# Patient Record
Sex: Female | Born: 1935 | Race: Black or African American | Hispanic: No | Smoking: Never smoker
Health system: Southern US, Community
[De-identification: ages and names within clinical notes are randomized; demographics above are authoritative.]

## PROBLEM LIST (undated history)

## (undated) DIAGNOSIS — G459 Transient cerebral ischemic attack, unspecified: Secondary | ICD-10-CM

## (undated) DIAGNOSIS — I951 Orthostatic hypotension: Secondary | ICD-10-CM

## (undated) DIAGNOSIS — E785 Hyperlipidemia, unspecified: Secondary | ICD-10-CM

## (undated) DIAGNOSIS — M109 Gout, unspecified: Secondary | ICD-10-CM

## (undated) DIAGNOSIS — I10 Essential (primary) hypertension: Secondary | ICD-10-CM

## (undated) DIAGNOSIS — D649 Anemia, unspecified: Secondary | ICD-10-CM

## (undated) DIAGNOSIS — Z8489 Family history of other specified conditions: Secondary | ICD-10-CM

## (undated) DIAGNOSIS — E119 Type 2 diabetes mellitus without complications: Secondary | ICD-10-CM

## (undated) DIAGNOSIS — N189 Chronic kidney disease, unspecified: Secondary | ICD-10-CM

## (undated) DIAGNOSIS — I739 Peripheral vascular disease, unspecified: Secondary | ICD-10-CM

## (undated) HISTORY — PX: OTHER SURGICAL HISTORY: SHX169

## (undated) HISTORY — DX: Anemia, unspecified: D64.9

## (undated) HISTORY — DX: Hyperlipidemia, unspecified: E78.5

## (undated) HISTORY — DX: Gout, unspecified: M10.9

## (undated) HISTORY — DX: Peripheral vascular disease, unspecified: I73.9

## (undated) HISTORY — PX: BREAST BIOPSY: SHX20

## (undated) HISTORY — PX: TUBAL LIGATION: SHX77

## (undated) HISTORY — DX: Essential (primary) hypertension: I10

## (undated) HISTORY — DX: Orthostatic hypotension: I95.1

## (undated) HISTORY — DX: Type 2 diabetes mellitus without complications: E11.9

## (undated) HISTORY — DX: Chronic kidney disease, unspecified: N18.9

## (undated) HISTORY — PX: KIDNEY TRANSPLANT: SHX239

## (undated) HISTORY — DX: Transient cerebral ischemic attack, unspecified: G45.9

---

## 1997-10-30 ENCOUNTER — Encounter: Admission: RE | Admit: 1997-10-30 | Discharge: 1997-10-30 | Payer: Self-pay | Admitting: Family Medicine

## 1997-11-05 ENCOUNTER — Encounter: Admission: RE | Admit: 1997-11-05 | Discharge: 1997-11-05 | Payer: Self-pay | Admitting: Family Medicine

## 1998-02-07 ENCOUNTER — Emergency Department (HOSPITAL_COMMUNITY): Admission: EM | Admit: 1998-02-07 | Discharge: 1998-02-07 | Payer: Self-pay | Admitting: Emergency Medicine

## 1998-03-17 ENCOUNTER — Encounter: Payer: Self-pay | Admitting: Emergency Medicine

## 1998-03-17 ENCOUNTER — Emergency Department (HOSPITAL_COMMUNITY): Admission: EM | Admit: 1998-03-17 | Discharge: 1998-03-17 | Payer: Self-pay | Admitting: Emergency Medicine

## 1998-06-02 ENCOUNTER — Encounter: Admission: RE | Admit: 1998-06-02 | Discharge: 1998-06-02 | Payer: Self-pay | Admitting: Family Medicine

## 1998-08-13 ENCOUNTER — Encounter: Admission: RE | Admit: 1998-08-13 | Discharge: 1998-08-13 | Payer: Self-pay | Admitting: Family Medicine

## 1998-11-06 ENCOUNTER — Encounter: Admission: RE | Admit: 1998-11-06 | Discharge: 1998-11-06 | Payer: Self-pay | Admitting: Family Medicine

## 1998-11-26 ENCOUNTER — Encounter: Admission: RE | Admit: 1998-11-26 | Discharge: 1998-11-26 | Payer: Self-pay | Admitting: Family Medicine

## 1998-12-16 ENCOUNTER — Encounter: Admission: RE | Admit: 1998-12-16 | Discharge: 1998-12-16 | Payer: Self-pay | Admitting: Family Medicine

## 1998-12-16 ENCOUNTER — Ambulatory Visit (HOSPITAL_COMMUNITY): Admission: RE | Admit: 1998-12-16 | Discharge: 1998-12-16 | Payer: Self-pay | Admitting: Family Medicine

## 1999-04-21 ENCOUNTER — Encounter: Admission: RE | Admit: 1999-04-21 | Discharge: 1999-04-21 | Payer: Self-pay | Admitting: Family Medicine

## 1999-07-13 ENCOUNTER — Encounter: Admission: RE | Admit: 1999-07-13 | Discharge: 1999-07-13 | Payer: Self-pay | Admitting: Family Medicine

## 1999-10-11 ENCOUNTER — Encounter: Admission: RE | Admit: 1999-10-11 | Discharge: 1999-10-11 | Payer: Self-pay | Admitting: Family Medicine

## 1999-10-14 ENCOUNTER — Encounter: Admission: RE | Admit: 1999-10-14 | Discharge: 1999-10-14 | Payer: Self-pay | Admitting: Family Medicine

## 1999-11-05 ENCOUNTER — Encounter: Admission: RE | Admit: 1999-11-05 | Discharge: 1999-11-05 | Payer: Self-pay | Admitting: Family Medicine

## 1999-11-09 ENCOUNTER — Encounter: Admission: RE | Admit: 1999-11-09 | Discharge: 1999-11-09 | Payer: Self-pay | Admitting: *Deleted

## 1999-11-09 ENCOUNTER — Encounter: Payer: Self-pay | Admitting: Family Medicine

## 1999-12-10 ENCOUNTER — Encounter: Admission: RE | Admit: 1999-12-10 | Discharge: 1999-12-10 | Payer: Self-pay | Admitting: Family Medicine

## 2000-04-05 ENCOUNTER — Encounter: Payer: Self-pay | Admitting: Family Medicine

## 2000-04-05 ENCOUNTER — Encounter: Admission: RE | Admit: 2000-04-05 | Discharge: 2000-04-05 | Payer: Self-pay | Admitting: *Deleted

## 2000-05-24 ENCOUNTER — Encounter: Admission: RE | Admit: 2000-05-24 | Discharge: 2000-05-24 | Payer: Self-pay | Admitting: Family Medicine

## 2000-10-05 ENCOUNTER — Encounter: Admission: RE | Admit: 2000-10-05 | Discharge: 2000-10-05 | Payer: Self-pay | Admitting: Family Medicine

## 2000-10-05 ENCOUNTER — Encounter: Admission: RE | Admit: 2000-10-05 | Discharge: 2000-10-05 | Payer: Self-pay | Admitting: Sports Medicine

## 2000-10-05 ENCOUNTER — Encounter: Payer: Self-pay | Admitting: Sports Medicine

## 2000-11-06 ENCOUNTER — Encounter: Admission: RE | Admit: 2000-11-06 | Discharge: 2000-11-06 | Payer: Self-pay | Admitting: Family Medicine

## 2000-11-20 ENCOUNTER — Encounter: Admission: RE | Admit: 2000-11-20 | Discharge: 2000-11-20 | Payer: Self-pay | Admitting: Family Medicine

## 2001-04-11 ENCOUNTER — Encounter: Admission: RE | Admit: 2001-04-11 | Discharge: 2001-04-11 | Payer: Self-pay | Admitting: Family Medicine

## 2001-07-13 ENCOUNTER — Encounter: Admission: RE | Admit: 2001-07-13 | Discharge: 2001-07-13 | Payer: Self-pay | Admitting: Family Medicine

## 2001-07-18 ENCOUNTER — Encounter: Admission: RE | Admit: 2001-07-18 | Discharge: 2001-07-18 | Payer: Self-pay | Admitting: Family Medicine

## 2001-08-06 ENCOUNTER — Encounter: Admission: RE | Admit: 2001-08-06 | Discharge: 2001-08-06 | Payer: Self-pay | Admitting: Family Medicine

## 2002-03-12 ENCOUNTER — Encounter: Payer: Self-pay | Admitting: Family Medicine

## 2002-03-12 ENCOUNTER — Encounter: Admission: RE | Admit: 2002-03-12 | Discharge: 2002-03-12 | Payer: Self-pay | Admitting: Family Medicine

## 2004-04-05 ENCOUNTER — Ambulatory Visit (HOSPITAL_COMMUNITY): Admission: RE | Admit: 2004-04-05 | Discharge: 2004-04-05 | Payer: Self-pay | Admitting: Family Medicine

## 2004-04-22 ENCOUNTER — Ambulatory Visit (HOSPITAL_COMMUNITY): Admission: RE | Admit: 2004-04-22 | Discharge: 2004-04-22 | Payer: Self-pay | Admitting: Family Medicine

## 2006-05-22 ENCOUNTER — Encounter: Admission: RE | Admit: 2006-05-22 | Discharge: 2006-05-22 | Payer: Self-pay | Admitting: Nephrology

## 2006-06-08 ENCOUNTER — Ambulatory Visit: Payer: Self-pay

## 2006-09-04 ENCOUNTER — Ambulatory Visit (HOSPITAL_COMMUNITY): Admission: RE | Admit: 2006-09-04 | Discharge: 2006-09-04 | Payer: Self-pay | Admitting: Family Medicine

## 2007-05-14 ENCOUNTER — Encounter (HOSPITAL_COMMUNITY): Admission: RE | Admit: 2007-05-14 | Discharge: 2007-08-12 | Payer: Self-pay | Admitting: Nephrology

## 2007-06-20 ENCOUNTER — Inpatient Hospital Stay (HOSPITAL_COMMUNITY): Admission: EM | Admit: 2007-06-20 | Discharge: 2007-06-26 | Payer: Self-pay | Admitting: Emergency Medicine

## 2007-06-20 ENCOUNTER — Ambulatory Visit: Payer: Self-pay | Admitting: Vascular Surgery

## 2007-06-20 ENCOUNTER — Encounter (INDEPENDENT_AMBULATORY_CARE_PROVIDER_SITE_OTHER): Payer: Self-pay | Admitting: Internal Medicine

## 2007-06-21 ENCOUNTER — Encounter (INDEPENDENT_AMBULATORY_CARE_PROVIDER_SITE_OTHER): Payer: Self-pay | Admitting: Internal Medicine

## 2007-08-21 ENCOUNTER — Encounter (HOSPITAL_COMMUNITY): Admission: RE | Admit: 2007-08-21 | Discharge: 2007-11-19 | Payer: Self-pay | Admitting: Nephrology

## 2007-12-03 ENCOUNTER — Encounter (HOSPITAL_COMMUNITY): Admission: RE | Admit: 2007-12-03 | Discharge: 2008-02-27 | Payer: Self-pay | Admitting: Nephrology

## 2008-01-08 ENCOUNTER — Encounter: Admission: RE | Admit: 2008-01-08 | Discharge: 2008-01-08 | Payer: Self-pay | Admitting: Nephrology

## 2008-01-15 ENCOUNTER — Inpatient Hospital Stay (HOSPITAL_COMMUNITY): Admission: EM | Admit: 2008-01-15 | Discharge: 2008-01-25 | Payer: Self-pay | Admitting: Emergency Medicine

## 2008-01-15 ENCOUNTER — Ambulatory Visit: Payer: Self-pay | Admitting: Vascular Surgery

## 2008-01-23 ENCOUNTER — Encounter (INDEPENDENT_AMBULATORY_CARE_PROVIDER_SITE_OTHER): Payer: Self-pay | Admitting: Nephrology

## 2008-01-23 ENCOUNTER — Ambulatory Visit: Payer: Self-pay | Admitting: Cardiovascular Disease

## 2008-03-05 ENCOUNTER — Ambulatory Visit: Payer: Self-pay | Admitting: Vascular Surgery

## 2008-03-05 ENCOUNTER — Ambulatory Visit (HOSPITAL_COMMUNITY): Admission: RE | Admit: 2008-03-05 | Discharge: 2008-03-05 | Payer: Self-pay | Admitting: Vascular Surgery

## 2008-03-29 ENCOUNTER — Emergency Department (HOSPITAL_COMMUNITY): Admission: EM | Admit: 2008-03-29 | Discharge: 2008-03-29 | Payer: Self-pay | Admitting: Emergency Medicine

## 2008-05-07 ENCOUNTER — Ambulatory Visit (HOSPITAL_COMMUNITY): Admission: RE | Admit: 2008-05-07 | Discharge: 2008-05-07 | Payer: Self-pay | Admitting: Nephrology

## 2008-06-20 ENCOUNTER — Encounter: Admission: RE | Admit: 2008-06-20 | Discharge: 2008-06-20 | Payer: Self-pay | Admitting: Family Medicine

## 2008-11-20 ENCOUNTER — Encounter: Admission: RE | Admit: 2008-11-20 | Discharge: 2008-11-20 | Payer: Self-pay | Admitting: Nephrology

## 2008-11-28 ENCOUNTER — Ambulatory Visit (HOSPITAL_COMMUNITY): Admission: RE | Admit: 2008-11-28 | Discharge: 2008-11-28 | Payer: Self-pay | Admitting: Nephrology

## 2009-03-11 ENCOUNTER — Ambulatory Visit: Payer: Self-pay | Admitting: Vascular Surgery

## 2009-03-15 ENCOUNTER — Ambulatory Visit: Payer: Self-pay | Admitting: Vascular Surgery

## 2009-03-16 ENCOUNTER — Ambulatory Visit (HOSPITAL_COMMUNITY): Admission: RE | Admit: 2009-03-16 | Discharge: 2009-03-16 | Payer: Self-pay | Admitting: Vascular Surgery

## 2009-06-26 ENCOUNTER — Ambulatory Visit (HOSPITAL_COMMUNITY): Admission: RE | Admit: 2009-06-26 | Discharge: 2009-06-26 | Payer: Self-pay | Admitting: Family Medicine

## 2009-06-29 ENCOUNTER — Other Ambulatory Visit: Admission: RE | Admit: 2009-06-29 | Discharge: 2009-06-29 | Payer: Self-pay | Admitting: Family Medicine

## 2009-08-26 ENCOUNTER — Ambulatory Visit (HOSPITAL_COMMUNITY): Admission: RE | Admit: 2009-08-26 | Discharge: 2009-08-26 | Payer: Self-pay | Admitting: Nephrology

## 2009-10-23 ENCOUNTER — Inpatient Hospital Stay (HOSPITAL_COMMUNITY): Admission: EM | Admit: 2009-10-23 | Discharge: 2009-10-25 | Payer: Self-pay | Admitting: Emergency Medicine

## 2009-11-11 ENCOUNTER — Ambulatory Visit (HOSPITAL_COMMUNITY): Admission: RE | Admit: 2009-11-11 | Discharge: 2009-11-11 | Payer: Self-pay | Admitting: Nephrology

## 2010-01-11 ENCOUNTER — Emergency Department (HOSPITAL_COMMUNITY): Admission: EM | Admit: 2010-01-11 | Discharge: 2010-01-11 | Payer: Self-pay | Admitting: Emergency Medicine

## 2010-03-11 ENCOUNTER — Emergency Department (HOSPITAL_COMMUNITY)
Admission: EM | Admit: 2010-03-11 | Discharge: 2010-03-11 | Payer: Self-pay | Source: Home / Self Care | Admitting: Emergency Medicine

## 2010-03-12 ENCOUNTER — Encounter: Payer: Self-pay | Admitting: Nephrology

## 2010-03-12 ENCOUNTER — Ambulatory Visit: Payer: Self-pay | Admitting: Vascular Surgery

## 2010-03-12 ENCOUNTER — Ambulatory Visit (HOSPITAL_COMMUNITY): Admission: RE | Admit: 2010-03-12 | Discharge: 2010-03-12 | Payer: Self-pay | Admitting: Surgery

## 2010-04-26 ENCOUNTER — Ambulatory Visit (HOSPITAL_COMMUNITY): Admission: RE | Admit: 2010-04-26 | Discharge: 2010-04-26 | Payer: Self-pay | Admitting: Surgery

## 2010-04-26 ENCOUNTER — Ambulatory Visit: Payer: Self-pay | Admitting: Vascular Surgery

## 2010-06-27 ENCOUNTER — Encounter: Payer: Self-pay | Admitting: Family Medicine

## 2010-07-02 ENCOUNTER — Ambulatory Visit: Admit: 2010-07-02 | Payer: Self-pay | Admitting: Vascular Surgery

## 2010-07-23 ENCOUNTER — Encounter (INDEPENDENT_AMBULATORY_CARE_PROVIDER_SITE_OTHER): Payer: Medicare Other | Admitting: Vascular Surgery

## 2010-07-23 ENCOUNTER — Encounter (INDEPENDENT_AMBULATORY_CARE_PROVIDER_SITE_OTHER): Payer: Medicare Other

## 2010-07-23 ENCOUNTER — Encounter: Payer: Self-pay | Admitting: *Deleted

## 2010-07-23 DIAGNOSIS — I70219 Atherosclerosis of native arteries of extremities with intermittent claudication, unspecified extremity: Secondary | ICD-10-CM

## 2010-07-26 NOTE — Assessment & Plan Note (Signed)
OFFICE VISIT  April Casey, April Casey DOB:  23-Aug-1931                                       07/23/2010 ZOXWR#:[id number]  This is an established patient.  Consultation is for right hip pain. Consultation is from Dr. Laurann Montana.  HISTORY OF PRESENT ILLNESS:  This is a 77 year old end-stage renal patient that has been seen at our office for multiple access needs that presents with cc: onset of right hip pain for about 1 month.  She describes this as persistent pain that radiates from her right hip down to her thigh.  She denies any worsening of this pain with ambulation and she also denies any pain in the calf or the foot with ambulation.  The pain does not necessarily improve with rest.  She also believes that her left thigh is beginning to have similar pain.  She denies any previous history of rest pain or any ulcerations or any type of gangrene.  Her atherosclerotic risk factors include hypertension, diabetes and end- stage renal disease.  PAST MEDICAL HISTORY:  Included end-stage renal disease requiring hemodialysis, hypertension, diabetes, anemia, secondary hyperparathyroidism, hyperlipidemia, history of TIA, gout.  PAST SURGICAL HISTORY:  Included a right internal jugular tunneled dialysis catheter in the left upper arm arteriovenous graft done in October of 2011.  Also, she underwent a thrombectomy and revision of left forearm arteriovenous graft in October of 2010.  She has also had a left internal jugular vein tunneled dialysis catheter placed in August of 2009.  SOCIAL HISTORY:  Included no tobacco, alcohol or illicit drug use.  FAMILY HISTORY:  Mother had a stroke and hypertension and father also had a stroke and had hypertension.  MEDICATIONS:  Included calcium, aspirin, meclizine, Plavix, glimepiride, lisinopril, amlodipine, Crestor.  ALLERGIES:  She has no known drug allergies but does have intolerance to codeine which gives her  nausea.  REVIEW OF SYSTEMS:  She had change in eyesight, sore throat, pain in leg with walking, pain in feet with lying flat, arthritis joint pain, muscle pain, anemia and kidney disease.  Otherwise the rest of her review of system was documented as negative.  PHYSICAL EXAMINATION:  Vital signs:  She had a blood pressure of 173/71, heart rate 65, respirations were 12. General:  The patient was obese, no apparent distress, alert and oriented x3. Head:  Normocephalic, atraumatic. ENT:  Oropharynx without any erythema or exudate.  Nares without any drainage or erythema.  Hearing is grossly intact. Eyes:  Pupils were equal, round, reactive to light.  Extraocular movements were intact. Neck:  Supple neck without any nuchal rigidity. Pulmonary:  She has symmetric expansion, good air movement.  No rales, rhonchi or wheezing. Cardiac:  She had regular rate and rhythm.  Normal S1-S2.  No murmurs, rubs, thrills or gallops. Vascular:  Palpable upper extremity radial and brachial pulses. Bilateral carotids were palpable.  The pulses can be auscultated in the carotids but no bruit per se.  Cannot appreciate her aorta due to her obesity.  There were no masses.  No obvious costovertebral angle tenderness.  There are bilaterally palpable femoral pulses, on the left foot I felt palpable dorsalis pedis, I did not appreciate the posterior tibial on the left side.  On the right side I felt the dorsalis pedis but I did not feel the posterior tibial. Musculoskeletal:  She had 5/5 strength in all  extremities including the left hand.  There was no evidence of any cyanosis or ulcerations. Neuro:  Cranial nerves II-XII were grossly intact.  Motor strength was noted above.  Sensation was intact in all extremities including the left arm. Psychiatric:  Judgment was intact.  Mood and affect were appropriate for her clinical situation. Skin:  Extremities as listed above.  Otherwise I did not note  any rashes. Lymphatic:  There was no cervical, axillary or inguinal lymphadenopathy. Extremities:  Left upper extremity there was a palpable pulse in this left upper arm graft.  The graft is highly pulsatile with a thrill in place.  However, there is somewhat resistive quality to this bruit in this upper arm.  Noninvasive vascular imaging: 1. She had bilateral ABIs completed.  This demonstrated ABI on the     right side of 1.21 and on the left 0.76.  The waveforms of the PT     and DP on the right side were triphasic, on the left posterior     tibial was monophasic and the dorsalis pedis was biphasic. 2. She also underwent exercise testing and this demonstrated on the     right side a drop to 1.15 with return to baseline at 1.22 after 3     minutes, on the left side she dropped from 0.76 down to 0.53 with a     return to 0.74.  MEDICAL DECISION MAKING:  This is a 77 year old female who presents with symptomatology that is not consistent with claudication.  Additionally, she has evidence of likely venous stenosis in the left upper arm graft. First I will address the peripheral arterial disease.  She has evidence in the left leg of likely iliac stenosis, however, she is completely asymptomatic at this point in this leg.  We discussed the natural history of peripheral arterial disease and this patient she has what likely is asymptomatic peripheral arterial disease.  I suspect also on the right side there is some degree of disease with calcified vessels resulting in the elevated ABIs.  At this point we agreed to proceed with purely a walking plan and maximal medical management.  I discussed in depth the nature of a walking plan and will follow up in 3 months for repeat studies to determine whether any progression of peripheral arterial disease.  My suspicion is that her right hip pain is more musculoskeletal in nature and may have a degenerative back etiology.  In regards to the venous  stenosis I recommended to the patient she undergo a shuntogram of the left graft and then possible angioplasty of any venous stenosis.  She agrees to this and will have it set up for this coming Monday.  Thank you for giving [place] the opportunity to participate in this patient's care.    Fransisco Hertz, MD Electronically Signed  BLC/MEDQ  D:  07/23/2010  T:  07/23/2010  Job:  [id number]  cc:   Stacie Acres. Cliffton Asters, M.D.

## 2010-07-27 ENCOUNTER — Other Ambulatory Visit (HOSPITAL_COMMUNITY): Payer: Self-pay | Admitting: Nephrology

## 2010-07-27 DIAGNOSIS — N186 End stage renal disease: Secondary | ICD-10-CM

## 2010-07-29 ENCOUNTER — Ambulatory Visit (HOSPITAL_COMMUNITY)
Admission: RE | Admit: 2010-07-29 | Discharge: 2010-07-29 | Disposition: A | Discharge status: Home / Self Care | Payer: Medicare Other | Source: Ambulatory Visit | Attending: Vascular Surgery | Admitting: Vascular Surgery

## 2010-07-29 DIAGNOSIS — T82898A Other specified complication of vascular prosthetic devices, implants and grafts, initial encounter: Secondary | ICD-10-CM | POA: Insufficient documentation

## 2010-07-29 DIAGNOSIS — I120 Hypertensive chronic kidney disease with stage 5 chronic kidney disease or end stage renal disease: Secondary | ICD-10-CM

## 2010-07-29 DIAGNOSIS — Z992 Dependence on renal dialysis: Secondary | ICD-10-CM | POA: Insufficient documentation

## 2010-07-29 DIAGNOSIS — Y849 Medical procedure, unspecified as the cause of abnormal reaction of the patient, or of later complication, without mention of misadventure at the time of the procedure: Secondary | ICD-10-CM | POA: Insufficient documentation

## 2010-07-29 DIAGNOSIS — E119 Type 2 diabetes mellitus without complications: Secondary | ICD-10-CM | POA: Insufficient documentation

## 2010-07-29 DIAGNOSIS — N186 End stage renal disease: Secondary | ICD-10-CM

## 2010-07-29 LAB — POCT I-STAT, CHEM 8
BUN: 55 mg/dL — ABNORMAL HIGH (ref 6–23)
Chloride: 109 mEq/L (ref 96–112)
HCT: 41 % (ref 36.0–46.0)
Potassium: 5.5 mEq/L — ABNORMAL HIGH (ref 3.5–5.1)
Sodium: 139 mEq/L (ref 135–145)

## 2010-07-29 LAB — GLUCOSE, CAPILLARY: Glucose-Capillary: 134 mg/dL — ABNORMAL HIGH (ref 70–99)

## 2010-07-31 NOTE — Op Note (Signed)
April Casey, DELAGARZA NO.:  [id number]  MEDICAL RECORD NO.:  [id number]           PATIENT TYPE:  O  LOCATION:  [facility]                         FACILITY:  [facility]  PHYSICIAN:  Fransisco Hertz, MD       DATE OF BIRTH:  February 09, 1932  DATE OF PROCEDURE:  07/29/2010 DATE OF DISCHARGE:  07/29/2010                              OPERATIVE REPORT   PROCEDURES: 1. Left upper arm arteriovenous graft cannulation under ultrasound     guidance. 2. Left shuntogram. 3. Venoplasty of the left high brachial vein x3.  PREOPERATIVE DIAGNOSES:  Malfunctioning left upper arm arteriovenous graft and possible venous outflow stenosis.  POSTOPERATIVE DIAGNOSES:  Malfunctioning left upper arm arteriovenous graft and possible venous outflow stenosis.  SURGEON:  Fransisco Hertz, MD.  ESTIMATED BLOOD LOSS:  Minimal.  CONTRAST:  About 85 mL.  ANESTHESIA:  Conscious sedation was utilized as anesthesia.  FINDINGS IN THIS CASE: 1. About a 2-cm length stenosis in the left high brachial artery     distal to the anastomosis. 2. Resolution of most of the stenosis with a small area of residual     stenosis of about 30%. 3. There was a limited rupture in the area of angioplasty which resolved     with repeat low pressure inflation.  INDICATIONS:  This 77 year old female has known left upper arm venous outflow stenosis.  She previously has had percutaneous interventions on this arm.  Most recently, when I saw her in clinic for evaluation for claudication, she had a highly pulsatile left upper arm arteriovenous graft.  I felt that she needed intervention before this graft would thrombose.  I discussed with her performing a left arm shuntogram and possible venous angioplasty to improve the venous outflow.  She is aware of the risks of this procedure, which include bleeding, infection, possible anaphylactic reaction to the dye, possible rupture of the vein, and possible need for emergent  surgical intervention.  She was aware of these risks and agreed to proceed forward with such.  DESCRIPTION OF THE OPERATION:  After full informed written consent was obtained from the patient, she was brought back to the angio suite and placed supine upon the angio table.  She was giving conscious sedation, amounts of which are documented in chart.  After being connected to monitoring equipment, she was prepped and draped in standard fashion for a left arm shuntogram.  I turned my attention to her arterial upper arm arteriovenous graft.  Under ultrasound guidance, I identified a patent segment of the graft near the arterial anastomosis.  This segment was cannulated with micropuncture needle, and a microwire was passed up into the graft.  The needle was exchanged for a microsheath, and then the sheath was connected to an extension tubing.  Hand injection was completed, which demonstrated an approximate 5-cm area of stenosis in the high brachial vein, which appeared to be distal to the anastomosis. Based on these findings, this would need immediate intervention to keep this graft patent.  A Bentson wire was then passed through the sheath and advanced up to the level of the innominate  vein.  The microsheath was then exchanged for a 6-French short sheath which was then hubbed and then flushed with heparinized saline.  At this point then, I obtained a 6-mm x 60-mm Powerflex balloon and inflated over the area of the stenosis.  This demonstrated greater than 30% residual stenosis, so this was exchanged for an 8-mm x 60-mm Powerflex balloon.  The previous inflation was up to 8 mm for 1 minute.  The 8-mm x 60-mm balloon was inflated to 8 atmospheres at 1-minute inflation. This continued to demonstrate an area of stenosis, so I felt that re-treatment with a [place]ado high-pressure balloon was going to be necessary.  We obtained an 8-mm x 40 mm Dorado balloon.  This was inflated over the areas  of recurrent stenosis, and we had resolution of the waist at 15 atmospheres at 1 minute.  This demonstrated a great improvement in the areas of stenosis.  There remained, however, more proximally, one short area of stenosis, so that we reinflated the [facility] balloon proximally and held it there at 15 atmospheres for 1 minute.  The balloon was removed.  Hand injection demonstrated a small area of extravasation, so I replaced the balloon over the area of extravasation and inflated the balloon to 4 atmospheres and held inflation for 90 seconds, and then removed the balloon.  Hand injections demonstrated no further extravasation at this point.  At this point, there was a strong thrill throughout this graft.  While there was one area on completion injection with about 30% stenosis towards the distal end of this stenotic segment, I felt that as there was already evidence of a previous partial rupture, that any further intervention would no longer be safe, so at this point we would accept this and allow the patient to heal.  In my opinion, in this patient, any further percutaneous interventions are likely to be fraught with possible vein rupture, so she should be considered for a jump graft revision if our graft becomes stenotic once again.  COMPLICATIONS:  None.  CONDITION:  Stable.     Fransisco Hertz, MD     BLC/MEDQ  D:  07/29/2010  T:  07/29/2010  Job:  [id number]  Electronically Signed by Leonides Sake MD on 07/31/2010 04:18:01 PM

## 2010-08-06 ENCOUNTER — Ambulatory Visit (HOSPITAL_COMMUNITY): Payer: Medicare Other

## 2010-08-19 LAB — POTASSIUM: Potassium: 5.5 mEq/L — ABNORMAL HIGH (ref 3.5–5.1)

## 2010-08-19 LAB — POCT I-STAT 4, (NA,K, GLUC, HGB,HCT)
Glucose, Bld: 104 mg/dL — ABNORMAL HIGH (ref 70–99)
Glucose, Bld: 54 mg/dL — ABNORMAL LOW (ref 70–99)
HCT: 30 % — ABNORMAL LOW (ref 36.0–46.0)
HCT: 32 % — ABNORMAL LOW (ref 36.0–46.0)
HCT: 38 % (ref 36.0–46.0)
Hemoglobin: 10.2 g/dL — ABNORMAL LOW (ref 12.0–15.0)
Hemoglobin: 10.9 g/dL — ABNORMAL LOW (ref 12.0–15.0)
Potassium: 4.1 meq/L (ref 3.5–5.1)
Potassium: 7.8 mEq/L (ref 3.5–5.1)
Sodium: 138 mEq/L (ref 135–145)
Sodium: 140 mEq/L (ref 135–145)

## 2010-08-19 LAB — GLUCOSE, CAPILLARY
Glucose-Capillary: 109 mg/dL — ABNORMAL HIGH (ref 70–99)
Glucose-Capillary: 148 mg/dL — ABNORMAL HIGH (ref 70–99)
Glucose-Capillary: 46 mg/dL — ABNORMAL LOW (ref 70–99)

## 2010-08-23 LAB — TYPE AND SCREEN
ABO/RH(D): O POS
Antibody Screen: NEGATIVE

## 2010-08-23 LAB — DIFFERENTIAL
Basophils Absolute: 0 10*3/uL (ref 0.0–0.1)
Basophils Relative: 0 % (ref 0–1)
Eosinophils Absolute: 0.1 10*3/uL (ref 0.0–0.7)
Eosinophils Relative: 0 % (ref 0–5)
Lymphocytes Relative: 22 % (ref 12–46)
Lymphocytes Relative: 36 % (ref 12–46)
Lymphs Abs: 1.8 10*3/uL (ref 0.7–4.0)
Lymphs Abs: 3 10*3/uL (ref 0.7–4.0)
Monocytes Relative: 6 % (ref 3–12)
Neutro Abs: 6 10*3/uL (ref 1.7–7.7)
Neutrophils Relative %: 51 % (ref 43–77)

## 2010-08-23 LAB — CBC
HCT: 18.5 % — ABNORMAL LOW (ref 36.0–46.0)
HCT: 25.9 % — ABNORMAL LOW (ref 36.0–46.0)
Hemoglobin: 6.2 g/dL — CL (ref 12.0–15.0)
Hemoglobin: 8.5 g/dL — ABNORMAL LOW (ref 12.0–15.0)
Hemoglobin: 9.7 g/dL — ABNORMAL LOW (ref 12.0–15.0)
MCHC: 33 g/dL (ref 30.0–36.0)
MCHC: 33.1 g/dL (ref 30.0–36.0)
MCHC: 33.2 g/dL (ref 30.0–36.0)
MCV: 91.4 fL (ref 78.0–100.0)
MCV: 92.6 fL (ref 78.0–100.0)
Platelets: 220 K/uL (ref 150–400)
RBC: 2.8 MIL/uL — ABNORMAL LOW (ref 3.87–5.11)
RBC: 3.19 MIL/uL — ABNORMAL LOW (ref 3.87–5.11)
RDW: 15.7 % — ABNORMAL HIGH (ref 11.5–15.5)
RDW: 16.1 % — ABNORMAL HIGH (ref 11.5–15.5)
WBC: 8.3 K/uL (ref 4.0–10.5)

## 2010-08-23 LAB — RENAL FUNCTION PANEL
CO2: 27 mEq/L (ref 19–32)
Calcium: 8.8 mg/dL (ref 8.4–10.5)
Creatinine, Ser: 8.59 mg/dL — ABNORMAL HIGH (ref 0.4–1.2)
GFR calc Af Amer: 5 mL/min — ABNORMAL LOW (ref >60)
GFR calc non Af Amer: 5 mL/min — ABNORMAL LOW (ref >60)
Phosphorus: 5.6 mg/dL — ABNORMAL HIGH (ref 2.3–4.6)

## 2010-08-23 LAB — BASIC METABOLIC PANEL
BUN: 37 mg/dL — ABNORMAL HIGH (ref 6–23)
CO2: 25 mEq/L (ref 19–32)
Chloride: 101 mEq/L (ref 96–112)
Creatinine, Ser: 7.21 mg/dL — ABNORMAL HIGH (ref 0.4–1.2)
GFR calc non Af Amer: 6 mL/min — ABNORMAL LOW (ref >60)

## 2010-08-23 LAB — POCT I-STAT, CHEM 8
BUN: 39 mg/dL — ABNORMAL HIGH (ref 6–23)
Calcium, Ion: 1 mmol/L — ABNORMAL LOW (ref 1.12–1.32)
Chloride: 105 meq/L (ref 96–112)
Creatinine, Ser: 7.2 mg/dL — ABNORMAL HIGH (ref 0.4–1.2)
Glucose, Bld: 227 mg/dL — ABNORMAL HIGH (ref 70–99)
HCT: 27 % — ABNORMAL LOW (ref 36.0–46.0)
Hemoglobin: 9.2 g/dL — ABNORMAL LOW (ref 12.0–15.0)
Potassium: 4.4 meq/L (ref 3.5–5.1)
Sodium: 139 meq/L (ref 135–145)
TCO2: 24 mmol/L (ref 0–100)

## 2010-08-23 LAB — HEMOCCULT GUIAC POC 1CARD (OFFICE): Fecal Occult Bld: POSITIVE

## 2010-08-23 LAB — COMPREHENSIVE METABOLIC PANEL
ALT: 9 U/L (ref 0–35)
CO2: 28 mEq/L (ref 19–32)
Calcium: 8.9 mg/dL (ref 8.4–10.5)
Creatinine, Ser: 5.9 mg/dL — ABNORMAL HIGH (ref 0.4–1.2)
GFR calc non Af Amer: 7 mL/min — ABNORMAL LOW (ref >60)
Glucose, Bld: 210 mg/dL — ABNORMAL HIGH (ref 70–99)

## 2010-08-23 LAB — GLUCOSE, CAPILLARY
Glucose-Capillary: 159 mg/dL — ABNORMAL HIGH (ref 70–99)
Glucose-Capillary: 68 mg/dL — ABNORMAL LOW (ref 70–99)
Glucose-Capillary: 96 mg/dL (ref 70–99)
Glucose-Capillary: 98 mg/dL (ref 70–99)
Glucose-Capillary: 99 mg/dL (ref 70–99)

## 2010-08-23 LAB — APTT: aPTT: 29 s (ref 24–37)

## 2010-08-23 LAB — LIPID PANEL
HDL: 23 mg/dL — ABNORMAL LOW (ref >39)
Total CHOL/HDL Ratio: 3.2 RATIO

## 2010-08-23 LAB — HEPATITIS B SURFACE ANTIGEN: Hepatitis B Surface Ag: NEGATIVE

## 2010-08-23 LAB — ABO/RH: ABO/RH(D): O POS

## 2010-08-23 LAB — PROTIME-INR
INR: 1.05 (ref 0.00–1.49)
Prothrombin Time: 13.6 s (ref 11.6–15.2)

## 2010-08-23 LAB — MAGNESIUM: Magnesium: 2.2 mg/dL (ref 1.5–2.5)

## 2010-08-23 LAB — HEMOGLOBIN A1C: Mean Plasma Glucose: 197 mg/dL — ABNORMAL HIGH (ref <117)

## 2010-09-09 LAB — POCT I-STAT 4, (NA,K, GLUC, HGB,HCT)
Glucose, Bld: 175 mg/dL — ABNORMAL HIGH (ref 70–99)
Hemoglobin: 13.9 g/dL (ref 12.0–15.0)

## 2010-10-19 NOTE — Discharge Summary (Signed)
NAMENAIKA, NOTO NO.:  [id number]   MEDICAL RECORD NO.:  [id number]          PATIENT TYPE:  INP   LOCATION:  6704                         FACILITY:  [facility]   PHYSICIAN:  Wilber Bihari. Caryn Section, M.D.   DATE OF BIRTH:  Mar 29, 1932   DATE OF ADMISSION:  01/14/2008  DATE OF DISCHARGE:  01/25/2008                               DISCHARGE SUMMARY   Please note that we had planned on discharging the patient on January 17, 2008, but she was weak and we kept her in the hospital until January 25, 2008, trying to address her weakness.  She is now being discharged on  January 25, 2008.  This discharge summary supersedes the discharge  summary that was done on January 17, 2008.   PRIMARY CARE [place]:  Dr. Camille Bal with [facility].   CONSULTANTS:  [facility].   PROCEDURES:  Placement of a left PermCath, placement of a left AV graft,  and hemodialysis.   REASON FOR ADMISSION:  The patient is a 77 year old black woman with  known chronic kidney disease who was scheduled to get a PermCath on  January 16, 2008, and to start hemodialysis on January 17, 2008; however,  she presented to the emergency room on January 14, 2008, after having 5  days of decreased appetite and increased fatigue and it was decided  that, given these uremic symptoms, we should go ahead and start  hemodialysis earlier and admit her to the hospital.   DISCHARGE DIAGNOSES:  1. End-stage kidney disease, now initiated on hemodialysis.  2. Type 2 diabetes.  3. History of hypertension, but actually having some hypotension      during this admission.  4. Anemia.  5. Secondary hyperparathyroidism.  6. Dyslipidemia.  7. History of right middle cerebral artery stenosis.  8. Small vessel disease, on Plavix.  9. Generalized weakness and deconditioning.   PERTINENT LABORATORIES:  On admission, the patient's hemoglobin was 9.9  whereas it had previously been 11.2 at the end of June.  Her BUN  was  142, her creatinine was 13.8, potassium 4.0, albumin 2.7, AST 244, and  ALT 178.  An EKG was done that showed no peak T waves, some sinus  bradycardia, and a slightly prolonged QT with a QT of 490 and a QTc of  464.  Additionally, on labs, her bicarbonate was 15 with an anion gap of  19.   Discharge medicines are as follows:  1. Aspirin 81 mg p.o. nightly.  2. PhosLo 2 tablets p.o. t.i.d. with meals.  3. Plavix 75 mg p.o. nightly.  4. Nepro 1 can p.o. b.i.d.  5. Nephro-Vite 1 tablet p.o. daily.  6. Crestor 40 mg p.o. nightly.   The patient was told to stop taking the following medicines at home:  Verapamil, glimepiride, Coreg, Januvia, and furosemide.   Hemodialysis orders are as follows:  A 2K bath, 2.5 calcium bath,  standard heparin, and duration 4 hours.  Estimated dry weight of 76.5.  Access currently through her Diatek.  She does have a left AV graft that  is  maturing.  BFR 400 and DFR 800.  Standard heparin.  EPO 28,000 units  every dialysis.  Hectorol 2 mcg every dialysis.  Iron is Venofer 100 mg  on Tuesday, Thursday, and Saturday through to February 05, 2008, then  she will changed to 50 mg on Wednesday.  Other dialysis orders includes  please check CBGs with each dialysis.   STUDIES DURING THIS HOSPITALIZATION:  We were concerned because the  patient was weak even after 1 weeks' worth of hemodialysis when we  thought her uremic symptoms should have been cleared up, so we did an  echocardiogram to make sure there was no cardiac cause for her weakness.  Results are as follows:  Overall normal left ventricular function with  an EF estimated to be 55%, mildly increased left ventricular wall  thickness, mildly calcified aortic valve, and a left atrium that was  upper limits of normal in terms of size.  No evidence of any cardiac  embolism.  Additionally, we did a CT head without contrast to make sure  there was no intracranial cause for her continued weakness.  It was   negative for bleed or other acute intracranial process.  It did show  some atrophy and nonspecific white matter changes, but that was  consistent with past CTs of her head.  Also, during the patient's stay,  she was having some abdominal discomfort.  An acute abdominal series was  done, which showed a nonobstructive bowel or gas pattern and no acute  cardiopulmonary abnormalities.   HOSPITAL COURSE:  1. End-stage renal disease.  Upon admission, the patient had signs of      uremia including decreased appetite and weakness and a      significantly increased BUN and creatinine when compared with her      most recent labs that were done on January 02, 2008.  A left PermCath      was placed.  An left arm AV graft was also placed and she was      initiated on dialysis through the left PermCath on the day after      admission.  She had been receiving Tuesday and Thursday dialysis      while here in the hospital with no complications.  2. Anemia.  The patient's hemoglobin was 9.9 on admission.  We placed      her on Aranesp and she will go home on the EPO dose listed in the      medication discharge summary.  Her iron was measured at 52 and her      percent saturation was measured at 20.  We will also give her total      of 10 doses of Venofer as outlined in the discharge medications.      After those 10 doses are completed on February 05, 2008, we will      change her to 50 mg every week.  3. History of a TIA.  She is on Plavix and aspirin for this.  In the      outpatient notes from Dr. Eliott Nine, it is noted that the patient has      a goal systolic blood pressure between 140 and 160.  We had a very      difficult time maintaining those blood pressures when she started      on hemodialysis.  Therefore, we have discontinued her verapamil and      her Coreg in an effort to support her blood pressures both off  dialysis and during dialysis.  We have not been able to maintain      that target  blood pressure during this hospital stay.  At the time      of discharge, her nonhemodialysis pressures have generally been in      the low 100 to the 120s systolic.  During dialysis on her most      recent treatment, her lowest systolic blood pressure was 109, so      this is an issue that will need continued monitoring.  4. Secondary hyperparathyroidism.  The patient did have a high      phosphorus that was 12.8 at the time of the admission.  At the time      of discharge, it decreased to 1.7.  We expect that this will go      back up following her hospitalization.  Because her phosphorus had      been so high, we placed her on both Fosrenol and PhosLo, so for      discharge, we are only sending her home on the PhosLo. Her      phosphorus may go back into the normal range on this new regimen.      We also placed her on Hectorol outpatient.  5. Diabetes.  While in the hospital, the patient early during her      hospitalization had a number of low blood sugars.  When she was      admitted, she was on both Januvia 100 mg and glimepiride 1 mg.      During her hospitalization, we stopped these medicines and placed      her on sliding scale insulin and a renal diet.  Her blood sugars      were mostly within a good range around 120, but she did have      several sugars that were in the 170s-220s and she did receive some      sliding scale insulin, although a small amount.  At discharge, we      are discharging her on no oral hypoglycemics and no insulin because      I think that the risk for her blood pressure going too low      outweighs the benefits of discharging her on these medications.  We      will need to see how her blood sugars do after her discharge and we      have ordered CBG checks every dialysis.  She may need to go back on      some type of diabetic medication in the future, but we need to see      how her sugars progress as an outpatient.  6. Blood pressures.  As mentioned  previously, the patient has a goal      systolic blood pressure in the 140s-160s due to having a right MCA      stenosis that is presignificant.  As I mentioned earlier, we have      been unable to maintain pressures in that range despite      discontinuing both her verapamil and Coreg, so again, this will be      an issue for followup on an outpatient basis.  7. Continued weakness.  We were hoping to discharge the patient      approximately 1 week ago, but she was weak, unable to walk, unable      to get out of bed.  PT consult was ordered, and the patient  was      initially having a difficult time working with PT.  At the time of      this dictation, she is able to get out of bed.  She is walking with      a walker, which she does not do at baseline.  Thus, we have ordered      home health PT, so that she can go home because the patient would      prefer not to go to a SNF at this time.  Both the patient and her      family who is quite supportive believe that they can manage with      home health PT.   DISPOSITION:  The patient is discharged home with home health PT and she  has been given a walker.   PENDING TEST RESULTS AT THE TIME OF DISCHARGE:  None.   DISCHARGE FOLLOWUP:  The patient is to go to dialysis tomorrow on January 26, 2008.  She is also to go to fill out paperwork, which the social  worker at the [facility] is now arranging with the family.  Followup issues will be continued monitoring of her blood glucose and  perhaps restarting any diabetic medications as appropriate, also  continued monitoring of her blood pressure and to maintain her systolics  at a reasonable level.       Asher Muir, MD  Electronically Signed      Wilber Bihari. Caryn Section, M.D.  Electronically Signed    SO/MEDQ  D:  01/25/2008  T:  01/26/2008  Job:  [id number]   cc:   Duke Salvia. Eliott Nine, M.D.  [facility]

## 2010-10-19 NOTE — Op Note (Signed)
NAMESHARNICE, BOSLER NO.:  [id number]   MEDICAL RECORD NO.:  [id number]          PATIENT TYPE:  INP   LOCATION:  6704                         FACILITY:  [facility]   PHYSICIAN:  Larina Earthly, M.D.    DATE OF BIRTH:  [birth year]   DATE OF PROCEDURE:  DATE OF DISCHARGE:                               OPERATIVE REPORT   PREOPERATIVE DIAGNOSIS:  End-stage renal disease.   POSTOPERATIVE DIAGNOSIS:  End-stage renal disease.   PROCEDURE:  Left internal jugular Diatek catheter placement with  ultrasound visualization.   SURGEON:  Larina Earthly, MD   ASSISTANT:  Nurse.   ANESTHESIA:  MAC.   COMPLICATIONS:  None.   DISPOSITION:  Recovery room, stable.   PROCEDURE IN DETAIL:  The patient was taken to operating room and placed  in supine position, where the area of the right and left neck were  imaged with ultrasound revealing a patent jugular veins bilaterally.  The patient was placed in Trendelenburg position.  Using local  anesthesia and a finder needle, the right internal jugular vein was  attempted to be accessed.  There was a puncture of the common carotid  artery and pressure was held for hemostasis.  There was some hematoma  present and on further visualization, it was difficult to visualize the  internal jugular vein.  For this reason, the left internal jugular vein  was accessed using local anesthesia and a guidewire passed easily down  to the level of right atrium.  A dilator and peel-away sheath were  passed over the guidewire, and the dilator and guidewire were removed.  A 28-cm Diatek catheter was passed down the peel-away sheath which was  removed.  The catheter was positioned at the level of the distal right  atrium.  The catheter was brought to the subcutaneous tunnel and was  secured to the skin with 3-0 nylon stitch.  The 2 lumen ports were  attached and both lumens were flushed and aspirated easily and were  locked with 1000 units per mL heparin.   The catheter was secured to the  skin with 3-0 nylon stitch and the entry site was closed with a 4-0  subcuticular Vicryl stitch.  Sterile dressing was applied.  The patient  was taken to the recovery room in stable condition.      Larina Earthly, M.D.  Electronically Signed     TFE/MEDQ  D:  01/15/2008  T:  01/16/2008  Job:  [id number]

## 2010-10-19 NOTE — Discharge Summary (Signed)
NAMESIGNORA, ZUCCO                ACCOUNT NO.:  [id number]   MEDICAL RECORD NO.:  [id number]          PATIENT TYPE:  INP   LOCATION:  6743                         FACILITY:  [facility]   PHYSICIAN:  Kela Millin, M.D.DATE OF BIRTH:  1931/12/27   DATE OF ADMISSION:  06/20/2007  DATE OF DISCHARGE:  06/26/2007                               DISCHARGE SUMMARY   DISCHARGE DIAGNOSES:  1. Recurrent right-sided weakness - likely secondary to small vessel      disease per neurologist.  2. Probable right middle cerebral artery stenosis - continued      surveillance recommend per Dr. Grandville Silos. Deveshwar, MRI/MRA in six      to eight months.  3. Acute on chronic renal failure - improved.  Creatinine today prior      to discharge is 2.31 (baseline creatinine per Dr. Aram Beecham B. Eliott Nine      is 2.58), in November 2008.  4. Diabetes mellitus with hypoglycemic episodes - hypoglycemic      episodes resolved prior to discharge.  Blood sugars ranging from 89-      172 and the patient has been instructed to hold off on Glyburide      and keep track of her blood sugars.  To follow up with her primary      care physician.  Her Actos was resumed and she is to continue this      upon discharge.  5. Hypertension - Recommendations per neurology, to keep the patient's      systolic blood pressures in the 140's to 160 range.  (Be a little      less aggressive with blood pressure control, secondary to the      cerebral vessel stenosis.)  6. Elevated homocysteine level.  7. Hyperlipidemia.  8. History of anemia - on Procrit as an outpatient per nephrology.   PROCEDURES/STUDIES:  1. CT scan of the head:  Brain atrophy and microvascular ischemic      changes.  Remote left basal ganglia, lacunar type infarction.  No      acute intra-cranial findings.  2. MRA/MRI:  No evidence of acute ischemia.  Old lacunar lesions and      supra and infra-tentorially.  Probable small vessel disease-type      changes  supratentorially could be related to chronic hypertension      and diabetes.  Mild to moderate thickening of mucosa in the      ethmoids and more prominently in the maxillary sinuses, left      greater than right.  3. MRA:  Significantly decreased caliber with signal drop-off which is      suspicious for high-grade stenosis in the right middle cerebral      artery.  4. A 2-D echocardiogram:  The ejection fraction was 60%.  No regional      wall motion abnormalities.   CONSULTATIONS:  1. Neurology, Dr. Porfirio Mylar Dohmeier.  2. Nephrology, Dr. Jomarie Longs Coladonato/Dr. Eliott Nine.  3. Interventional radiology, Dr. Grandville Silos. Deveshwar.   HISTORY:  The patient is a 77 year old black female with the above-  listed medical problems,  who presented with complaints of right-sided  weakness.  She reported that the weakness had started early on the  morning of admission.  By the time she got to the emergency room the  weakness had resolved completely.  It was noted that two days prior to  admission the patient had a similar right-sided weakness which resolved.  She was seen by her primary care physician and outpatient studies  ordered, but her symptoms recurred prior to those studies being done,  and so she came to the hospital.  She had imaging studies done as a  result, as stated above.   Please see the history and physical of June 20, 2007, for the details  of the admission physical examination as well as the laboratory data.   HOSPITAL COURSE:  #1 - RECURRENT RIGHT-SIDED WEAKNESS:  Upon admission  the patient was maintained on aspirin and a workup including a 2-D  echocardiogram, MRA/MRI, carotid Doppler ultrasound and homocysteine  level were done.  The imaging study results are as stated above.  The  carotid Doppler ultrasound was negative for ICA stenosis.  The 2-D  echocardiogram done on June 21, 2007, was within normal limits.  The  ejection fraction was 60% with no left regional wall  motion  abnormalities noted.  Neurology was consulted and Dr. Vickey Huger saw the  patient while in the hospital.  Her impression was that this right-sided  weakness was likely secondary to small vessel disease.  The patient was  subsequently placed on Plavix as well as low-dose aspirin for this.  Dr.  Vickey Huger indicated that should the patient's symptoms recur on the  antiplatelet therapy, a follow-up electroencephalogram would need to be  done as an outpatient.  She is to follow up with Dr. Vickey Huger as needed.   #2 - PROBABLE RIGHT MIDDLE CEREBRAL ARTERY STENOSIS:  As noted above,  she had an MRA which was suspicious for a high-grade stenosis.  Of note  is the fact that this possible stenosis does not account for the right-  sided weakness that the patient presented with.  Dr. Vickey Huger initially  recommended that an angiogram be done to further work up this stenosis.  Following a discussion of the patient with interventional radiology, Dr.  Corliss Skains, as well as nephrology, Dr. Eliott Nine, she re-evaluated the  patient, and given her comorbidities, especially her chronic kidney  disease with a baseline creatinine of 2.58, it was decided that since  this lesion was not symptomatic, that the patient be treated with dual  antiplatelet therapy, Plavix and aspirin 81 mg daily.  Dr. Corliss Skains  also recommended continued surveillance, an MRA/MRI in six to eight  months.  The patient is to follow up with her PCP and the MRI/MRA to be  ordered for six to eight months, as recommended.   #3 - ACUTE ON CHRONIC RENAL INSUFFICIENCY/KIDNEY DISEASE:  The patient's  creatinine on admission was noted to be slightly elevated from her  baseline at 2.66.  The patient's ARB was held and the patient received  IV fluids in the hospital as well, because she had a hypotensive episode  during her hospital stay.  Nephrology was consulted and Dr. Eliott Nine  followed the patient in the hospital.  Her creatinine improved and  her  last creatinine prior to discharge is 2.31.  She is to follow up with  Dr. Eliott Nine on July 12, 2007.   #4 - HYPERTENSION:  The patient had a hypotensive episode during her  hospital stay, and  so her antihypertensives were held.  Following IV  fluids, this resolved.  They have remained stable off of the IV fluids.  Verapamil was started at 240 mg p.o. daily.  As noted earlier, neurology  has recommended that her blood pressure control be less aggressive -  keeping her blood pressures in the 140's to 160's, secondary to the  stenotic cerebral arteries.  The patient has been instructed to  discontinue her Benazepril until she follows up with her primary care  physician and nephrologist.   #5 - DIABETES MELLITUS:  The patient had hypoglycemic episodes in the  hospital.  As a result, her oral hypoglycemic agents were held.  With  this intervention, the hypoglycemia resolved.  Her blood sugar today  range from 89 to 172 prior to discharge.  Her Actos has been resumed.  She has been instructed to hold off on Glyburide until she follows up  with her primary care physician. She is to keep a log of her blood  sugars and follow up with her primary care physician.   #6 - ELEVATED HOMOCYSTEINE LEVEL:  This was done while the patient was  in the hospital and a fasting level was noted to be elevated at 35.9.  The patient was started on Foltx and she is to continue this upon  discharge.   DISCHARGE MEDICATIONS:   NOTATION:  The patient to hold off the Benazepril and Glyburide, as  above.  1. Verapamil changed to 240 mg daily.  2. Aspirin changed to 81 mg daily.  3. Plavix 75 mg p.o. daily.  4. Foltx one tab daily.  5. Continue carvedilol 6.25 mg daily.  6. Crestor 40 mg daily.  7. Calcitriol  daily.  8. Actos 15 mg daily.   FOLLOWUP:  1. Dr. Stacie Acres. White in one week.  As above, the patient is to have      a follow-up MRI/MRA in six to eight months.  2. Dr. Eliott Nine, as  scheduled.  3. Dr. Vickey Huger.  Call for an appointment.  4. Dr. Corliss Skains as needed.   CONDITION ON DISCHARGE:  Improved/stable.      Kela Millin, M.D.  Electronically Signed     ACV/MEDQ  D:  06/26/2007  T:  06/26/2007  Job:  [id number]   cc:   Stacie Acres. Cliffton Asters, M.D.  [facility] Eliott Nine, M.D.  Sanjeev K. Corliss Skains, M.D.  Melvyn Novas, M.D.

## 2010-10-19 NOTE — Consult Note (Signed)
NAMEJERELYN, TRIMARCO NO.:  [id number]   MEDICAL RECORD NO.:  [id number]          PATIENT TYPE:  INP   LOCATION:  6743                         FACILITY:  [facility]   PHYSICIAN:  Melvyn Novas, M.D.  DATE OF BIRTH:  Jan 25, 1932   DATE OF CONSULTATION:  06/22/2007  DATE OF DISCHARGE:                                 CONSULTATION   REFERRING PHYSICIAN:  Kela Millin, M.D.   A 77 year old, right-handed, African-American female with a medical  record number the [id number].   This patient is admitted with right-sided transient weakness and was  also seen by her kidney physicians at a local ER.  The patient apparently had recurrent right-sided weakness and heaviness,  possibly dysmetria but no numbness, her spells affecting mostly the  right upper extremity.  The question of TIA was raised.   Dr. Suanne Marker consulted up after an abnormal MRI/MRA returned. She states  that a final report was not available yet but no CVA was seen, however,  there is a report of a high-grade stenosis of the middle cerebral artery  at the trifurcation.  This could clinically support transient ischemic attacks or waxing and  waning neurologic symptoms depending on blood pressure however, the  stenosis seen on the right MCA and the patient has right-sided body  symptoms which does not clinically correlate.   HISTORY OF PRESENT ILLNESS:  The patient is a 77 year old female who  presents with above complaints of right-sided transient weakness and  numbness and she states that her right hand has lower grip strength. She  woke at about 4:00 a.m. on June 20, 2007 when she had quite  significant right-sided weakness.  She went back to sleep and later on  she woke up and her entire right side was weak.  She denies any chest  pain, shortness of breath, melena, diarrhea, nausea, vomiting, vision  loss.  The patient states that she had 2 days ago similar symptoms, saw her  primary internist and  had been scheduled for outpatient open MRI on the  Friday as well as carotid Doppler ultrasound.   PAST MEDICAL HISTORY:  Dr. Eliott Nine follows her for her diabetic  nephropathy.  She also has a history of chronic disease anemia and is on  Procrit per nephrology.   MEDICATIONS:  1. Verapamil 240 mg b.i.d.  2. Benazepril 40 mg daily.  3. Crestor.  4. Glyburide.  5. Aspirin 325 mg daily.  6. Actos 50 mg daily.  7. Carvedilol 6.25 mg daily.  8. Calcitriol daily.  9. Fluid pill recently started per nephrologist which the patient now      states is Lasix.   ALLERGIES:  CODEINE.   SOCIAL HISTORY:  She denies tobacco, denies alcohol use.  She is retired  and has a living sister and states that her grandson is taking care of  her.   FAMILY HISTORY:  She endorses that her mother had a CVA, her father had  an abdominal aneurysm and father had mini strokes and diabetes.   REVIEW OF SYSTEMS:  As above.   The patient is an  alert and oriented, pleasant, elderly female that was  sleeping when I had to awaken her for the neurologic consultation.  Temperature is 98 degrees Fahrenheit, blood pressure is 148/73 and the  patient states that her symptoms have resolved. Pulse rate is 60 and  respiratory rate is 18.  LUNGS:  Clear to auscultation.   The patient has no speech impediment, dysarthria, no facial droop.  The  patient is able to extend both upper extremities in front of her.  she  has right-sided mild mildly weaker grip strength than on the left but I  do not fully see a persistent deficit. On her finger-nose test, there  was no dysmetria, no ataxia and no tremor.  She has deep tendon reflexes 1+ throughout except for the lower  extremities for these are attenuated.  She does not have a Babinski response to plantar stimulation.  Again  likely to a diabetic nephropathy.  The patient was able to provide a rather weak dorsiflexion and plantar  flexion bilaterally.  She does not show  signs of hemi-extinction or hemi-  numbness.  Gait and station were deferred today.    The patient was able to eat unassisted.  She can sit up in bed unassisted.   ASSESSMENT:  I discussed with a the patient's daughter that I could see  a high-grade stenosis causing waxing and waning of symptoms but that she  has right-sided weakness and that the stenosis by MRI is also suggested  to be on the right middle cerebral artery which would clinically not  explain her symptoms.  I suggested to consult the interventional radiology to Dr. Donnalee Curry in my discussion yesterday and I would also like the patient to  keep her blood pressure above 130 systolic.  If possible without endangering her residual kidney function, I would  like for the patient to undergo a traditional angiography and see if  there is an ulcerated plaque or anything else visible that could account  for her right-sided symptoms.  An interventional procedure could be considered if the patient undergoes  traditional angiography rather than CT angiography. I am at this time  not sure again that the MRA correlates with the clinical symptoms at all  and I think that further clarification of her cerebrovascular status is  needed. The patient has mild proteinuria.  She has a fastening glucose of 144.  She has a glomerular filtrate rate of only 18 mL per minute, creatinine  is 2.58, BUN is 41. Normal phosphorus, calcium, albumin, bilirubin,  white blood call and red blood cell count.      Melvyn Novas, M.D.  Electronically Signed    CD/MEDQ  D:  06/22/2007  T:  06/22/2007  Job:  [id number]   cc:   Kela Millin, M.D.

## 2010-10-19 NOTE — Consult Note (Signed)
April Casey, April NO.:  [id number]   MEDICAL RECORD NO.:  [id number]          PATIENT TYPE:  INP   LOCATION:  6743                         FACILITY:  [facility]   PHYSICIAN:  Terrial Rhodes, M.D.DATE OF BIRTH:  1931/11/20   DATE OF CONSULTATION:  06/24/2007  DATE OF DISCHARGE:  06/26/2007                                 CONSULTATION   REASON FOR CONSULTATION:  Acute on chronic renal failure and reduction  of contrast nephropathy.   HISTORY OF PRESENT ILLNESS:  Ms. April Casey is a 77 year old African  American female with past medical history significant for diabetes,  hypertension and chronic kidney disease stage III-IV with a solitary  functioning kidney, who was admitted on June 20, 2007, with right-  sided weakness that lasted for about 1 hour.  She was admitted and  workup was ordered for a TIA.  Neurology was consulted on January 16 and  the MRI showed a right MCA lesion; however, this would not explain her  right-sided weakness and it was felt that the next step would be to  proceed with a renal angiogram.  We were asked to see the patient to  help to reduce the risk for contrast nephropathy.  Her creatinine at  baseline at our office is 1.7 to 2.  However, when she was admitted in  the hospital her serum creatinine was 2.7.   ALLERGIES:  CODEINE, which causes nausea and vomiting.   PAST MEDICAL HISTORY:  1. Chronic kidney disease stage III-IV, baseline creatinine of 1.7-      1.8.      a.     Solitary functioning kidney.  2. Hypertension.  3. Diabetes.  4. Anemia of chronic disease.  5. Secondary hyperparathyroidism.  6. Dyslipidemia.   CURRENT MEDICATIONS:  1. Glyburide 6 mg b.i.d.  2. Verapamil 240 mg b.i.d.  3. Benazepril 40 mg a day.  4. Colchicine 0.6 mg b.i.d. p.r.n.  5. Crestor 40 mg a day.  6. Calcitriol 0.25 mcg a day.  7. Carvedilol 6.25 mg daily.  8. Aspirin 81 mg a day.  9. Actos 50 mg a day.  10.Furosemide 20 mg a day.   FAMILY HISTORY:  She has a brother who had kidney failure on dialysis  and a history for diabetes and hypertension.   SOCIAL HISTORY:  She is a native of [place] area.  Went to high  school at [employer].  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  No tobacco, no alcohol, no  drug use.   REVIEW OF SYSTEMS:  GENERAL:  The patient feels much better, has not had  recurrence of her weakness.  CARDIAC:  Denies any chest pain,  palpitations, orthopnea, PND.  PULMONARY:  No shortness of breath,  hemoptysis, productive cough.  GI:  No nausea, vomiting, hematochezia,  melena or bright red blood per rectum.  GU:  No dysuria, pyuria,  hematuria, urgency, frequency, retention.  NEUROLOGIC:  No arthralgias  or myalgias.  DERMATOLOGIC:  No rashes, lumps or bumps.  HEMATOLOGIC:  No abnormal  bleeding or bruising.  All other systems negative.   PHYSICAL EXAM:  A well-developed, well-nourished female in no apparent  distress.  She is afebrile, temperature 98.3, pulse 52, blood pressure 138/64,  respiratory rate is 20.  HEENT:  Head normocephalic, atraumatic.  Extraocular muscles intact.  No  icterus.  Oropharynx without lesions.  NECK:  Supple.  No lymphadenopathy or bruits.  LUNGS:  Clear to auscultation and percussion bilaterally.  No rales,  rubs or rhonchi.  CARDIAC:  Bradycardic.  No precordial rub appreciated.  ABDOMEN:  Normoactive bowel sounds, soft, nontender, nondistended.  No  guarding or rebound.  EXTREMITIES:  No clubbing, cyanosis or edema.   LABS:  Sodium 145, potassium 3.7, chloride 115, CO2 25, BUN 31,  creatinine 2.24, glucose 69, calcium 8.7.   ASSESSMENT/PLAN:  1. Acute on chronic renal failure.  The patient's creatinine was above      baseline; however, it is trending back towards her usual baseline      of 1.7 to 1.8.  She is on an ACE inhibitor but we may want to hold      this and evaluate her renal arteries as an  outpatient.  However, I      did have a talk with her regarding her increased risk for renal      failure from contrast-induced nephropathy.  She is not sure she      would want to go ahead with dialysis, and therefore I am reluctant      to encourage any further thought of this until this is further      reviewed with both the neurologist and neurosurgeon.  Since her      lesions would not explain her symptoms, I question the utility of      the angiogram that is tentatively scheduled.  However, if this is      deemed medically necessary, I would recommend making sure she was      off her ACE inhibitor and would limit her contrast as much as      possible, using CO2 when able, and also to hydrate her pre and post      procedure with either normal saline or bicarbonate per protocol of      interventional neuroradiology, and also Mucomyst 800 mg b.i.d. pre      and post.  However, again, would recommend further discussion      between the primary service neurologist and the interventional      neuroradiologist.  2. Hypertension.  Given her symptoms, I agree with neurology about      keeping her systolic blood pressure above 086.  Will continue with      the current regimen.  3. Anemia.  Will recheck CBC in the morning.  4. Diabetes mellitus is stable.  5. Secondary hyperparathyroidism on calcitriol.  6. Transient ischemic attack as above.  These have resolved and have      not recurred during her hospitalization.   Thank you for this consultation.           ______________________________  Terrial Rhodes, M.D.     JC/MEDQ  D:  06/28/2007  T:  06/29/2007  Job:  [id number]

## 2010-10-19 NOTE — H&P (Signed)
April Casey, April Casey                ACCOUNT NO.:  [id number]   MEDICAL RECORD NO.:  [id number]          PATIENT TYPE:  INP   LOCATION:  6733                         FACILITY:  [facility]   PHYSICIAN:  Kela Millin, M.D.DATE OF BIRTH:  [birth year]   DATE OF ADMISSION:  06/20/2007  DATE OF DISCHARGE:                              HISTORY & PHYSICAL   PRIMARY CARE PHYSICIAN:  Stacie Acres. White, M.D.   CHIEF COMPLAINT:  Right-sided weakness for about 1 hour.   HISTORY OF PRESENT ILLNESS:  The patient is a 77 year old black female  with past medical history significant for diabetes, hypertension,  chronic renal insufficiency, anemia, and possible recent TIA, who  presents with the above complaints.  She states that she woke up at  about 4 a.m. today with right arm weakness.  She went back to sleep and  later on when she woke up, her entire right side was weak.  By the time  she got to the ER, the weakness had resolved completely.  She denies  headaches, slurred speech, dysphagia, blurry vision, fevers, melena,  diarrhea, paresthesias, and no hematochezia.  She also denies chest  pain, and no shortness of breath.  April Casey states that 2 days ago she  had similar symptoms and saw her primary care physician then and was  scheduled for outpatient open MRI on Friday as well as a carotid Doppler  ultrasound.  She came to the ER as discussed ready because her symptoms  recurred today.   In the ER she had a CT scan of her head, which is negative for acute  findings.  She is admitted for further evaluation and management.   PAST MEDICAL HISTORY:  1. As above.  She is followed by Dr. Eliott Nine (nephrologist, for her      kidneys).  2. History of anemia.  She is on Procrit per nephrology.   MEDICATIONS:  1. Verapamil 240 mg p.o. b.i.d.  2. Benazepril 40 mg p.o. day daily.  3. Crestor 40 mg p.o. daily.  4. Glyburide p.o. b.i.d.  5. Aspirin 325 mg.  6. Actos 15 mg daily.  7. Carvedilol 6.25 mg  daily.  8. Calcitriol daily.  9. Fluid pill, recently started per nephrologist.   ALLERGIES:  CODEINE.   SOCIAL HISTORY:  She denies tobacco.  She also denies alcohol.   FAMILY HISTORY:  Her mother had a stroke, her father had an aneurysm,  and her brother has had mini strokes and diabetes.   REVIEW OF SYSTEMS:  As per HPI, other review of systems negative.   PHYSICAL EXAM:  GENERAL:  The patient is a pleasant, elderly black  female in no apparent distress.  VITAL SIGNS:  Temperature is 97, blood pressure is 152/73, initially  194/89, pulse is 57, initially 76, respiratory rate is 18, and O2  saturation is 96%.  HEENT:  PERRL, EOMI, no facial asymmetry, moist mucous membranes.  No  oral exudates.  NECK:  Supple, no adenopathy, no carotid bruits appreciated.  No JVD.  LUNGS:  Clear to auscultation bilaterally.  No crackles or wheezes.  CARDIOVASCULAR:  Regular rate and rhythm.  Normal S1-S2.  ABDOMEN:  Soft, bowel sounds present, nontender,nondistended, no  organomegaly, and no masses palpable.  EXTREMITIES:  No cyanosis and no edema.  NEURO:  She is alert and oriented x3.  Cranial nerves II-XII are grossly  intact.  Her strength is 5/5 and symmetric.  Nonfocal exam.   LABORATORY DATA:  CT scan of head:  No acute intracranial findings.  Urinalysis:  Negative for infection.  Sodium is 144, potassium 3.6,  chloride is 104, CO2 is 29, glucose is 90, BUN is 50, creatinine 2.66  and her calcium is 9.8.  Albumin 3.8, AST is 27, ALT is 21.  White cell  count is 6.8, hemoglobin is 10.4, hematocrit is 31.8.  The platelet  count is 272, neutrophil count is 71%.  INR is 1.0.  Point of care  markers negative x1.   ASSESSMENT AND PLAN:  1. Transient right-sided weakness, probable transient ischemic attack.      Will continue aspirin, obtain a carotid Doppler ultrasound,      MRI/MRA, also 2-D echo, homocystine level.  Follow and consider      neurology consultation pending above studies.   2. Uncontrolled hypertension.  Monitor and treat as appropriate,      continue outpatient medications.  3. Renal insufficiency, likely acute on chronic.  The patient followed      by Dr. Eliott Nine.  Will hold diuretics for now, obtain outpatient      records, follow and recheck.  4. Anemia, secondary to #3, on Procrit per nephrology.  Follow and      recheck.  5. Diabetes mellitus.  Monitor and continue outpatient medications.      Kela Millin, M.D.  Electronically Signed     ACV/MEDQ  D:  06/20/2007  T:  06/20/2007  Job:  [id number]   cc:   Stacie Acres. Cliffton Asters, M.D.  [facility] Eliott Nine, M.D.

## 2010-10-19 NOTE — Discharge Summary (Signed)
NAMEPIPER, HASSEBROCK NO.:  [id number]   MEDICAL RECORD NO.:  [id number]          PATIENT TYPE:  INP   LOCATION:  6704                         FACILITY:  [facility]   PHYSICIAN:  Wilber Bihari. Caryn Section, M.D.   DATE OF BIRTH:  [birth year]   DATE OF ADMISSION:  01/14/2008  DATE OF DISCHARGE:  01/17/2008                               DISCHARGE SUMMARY   PRIMARY CARE Corneisha Alvi:  Aram Beecham B. Eliott Nine, MD, with [facility].   CONSULTANTS:  [facility].   PROCEDURES:  Placement of a left PermCath, also placement of left  arteriovenous graft, and hemodialysis.   REASON FOR ADMISSION:  The patient is a 77 year old black woman with  known chronic kidney disease who was scheduled to get a PermCath on  January 16, 2008, and to start hemodialysis on January 17, 2008.  However,  she presented to the emergency room on the January 14, 2008, after having  5 days of decreased appetite and increased fatigue, and it was decided  that she should go ahead and start hemodialysis earlier and be admitted.   DISCHARGE DIAGNOSES:  1. End-stage kidney disease, now initiated on hemodialysis.  2. Type 2 diabetes.  3. Hypertension.  4. Anemia.  5. Secondary hyperparathyroidism.  6. Dyslipidemia.  7. History of right middle cerebral artery stenosis.  8. Small-vessel disease, on Plavix.   PERTINENT LABORATORIES:  On admission, the patient's hemoglobin was 9.9  whereas it had previously been 11.2 at the end of June.  Her BUN was  142, her creatinine was 13.8, potassium 4.0, albumin 2.7, AST 244, and  ALT 178.  An EKG was done that showed no peaked T-waves, some sinus  bradycardia, and a slightly prolonged QT with QT of 490 and a QTc of  464.  Additionally on labs, her bicarbonate was 15 with an anion gap of  19.   DISCHARGE MEDICINES:  1. Aspirin 81 mg daily.  2. Coreg 6.25 mg b.i.d.  3. Verapamil SR 240 mg daily.  4. Crestor 40 mg daily.  5. Nephro-Vite 1 tablet daily.   The patient  was told to stop taking Januvia, glimepiride, and  furosemide.  She is also to stop her calcitriol and Aranesp shot, that  she will get different medicines in dialysis.   HEMODIALYSIS ORDERS:  At the time of this dictation, we are hoping that  Ms. Billard will be able to start dialysis on Saturday at [facility], and  these are her orders if she is able to start on the January 19, 2008:  Bath, 2K/2.5 calcium, duration 3-1/2 hours.  Estimated dry weight of 73  kg.  Access, right PermCath.  Blood flow rate 150 for 1 session and then  250 for 3 sessions and then 400.  DFR 500.  Heparin, standard.  EPO  17,500 on Tuesday, Thursday, and Saturday; Zemplar 1 mcg on Tuesday,  Thursday, and Saturday, and iron, Venofer, 100 mg x8 more doses.  Additional pertinent dialysis information:  The patient's hepatitis  status is negative.  Her hemoglobin is 9.6, potassium 4.2, and  phosphorus 11.5.  PT/INR 13.8/1.0.  Percent saturation 20, calcium 8.0,  and albumin 2.7.  These are her most recent labs prior to discharge.  Additional orders for dialysis included checking CBGs during each  dialysis session.   HOSPITAL COURSE BY PROBLEM:  1. End-stage renal disease.  Upon admission, the patient had some      signs of uremia including decreased appetite and weakness with a      significantly increased BUN and creatinine since her most recent      labs on January 02, 2008.  She received a left PermCath, and      hemodialysis was initiated the day after admission.  She also on      January 16, 2008, received a left arm AV graft.  She received 2      dialysis sessions while here in the hospital, with no      complications.  2. Anemia.  The patient's hemoglobin was 9.9 on admission.  She was      placed on Aranesp, and she will go home on EPO dose listed in the      medication discharge summary.  Her iron was measured at 52, and her      percent saturation was measured at 20.  She will also be on Venofer      for a total  of 10 doses, again as outlined in the discharge      medicines.  3. History of TIA.  We held her Plavix while she was getting these      procedures, but she will be restarted on that upon discharge.  4. Secondary hyperparathyroidism.  The patient did have a high      phosphorus of 12.8 on admission.  At the time of discharge, it is      still elevated at 11.5 prior to her last dialysis session, and we      will need to continue to monitor that.  She may need phosphate      binders if it does not come down with further hemodialysis.  For      now, we continued her on calcitriol, but as an outpatient she will      receive Zemplar.  5. Diabetes.  While in the hospital, the patient had several low blood      sugars.  Upon admission, she was on Januvia 100 mg and glimepiride      1 mg.  We are stopping those medicines for now.  During the times      when she was eating a renal diet, her blood sugars were in the 140s      without any insulin.  This would need close monitoring.  We are      asking that the dialysis center please check CBGs with each      dialysis for the first few weeks following discharge.  She may      eventually need some oral hypoglycemics or insulin, but I think      that her risk for her blood sugar going too low outweighs the      benefits of those at this time, and we will need to see what her      new requirement is now that she is on dialysis.  In the past, Dr.      Eliott Nine has written for her diabetic medications, and she can      restart those as she sees appropriate.  6. Hypertension.  At the time of this dictation, the  patient still is      having some hypertension despite being on verapamil and Coreg.      However, hopefully as she has more hemodialysis sessions her blood      pressure will get under better control or if not she may need      titration of her antihypertensives.   DISPOSITION:  At the time of this dictation, we were hoping to get the  patient's  dialysis started on January 19, 2008.  If that is not able to  be arranged, we will have to keep her in the hospital a few more days  for her Saturday dialysis session.  If that is the case, I will dictate  a discharge summary addendum.   DISPOSITION:  The patient is to be discharged home.  She is in stable  condition.   PENDING TEST RESULTS AT TIME OF DISCHARGE:  None.   DISCHARGE FOLLOWUP:  With Dialysis, as of this time, on January 19, 2008.   FOLLOWUP ISSUES:  Continued monitoring of her blood glucose and  restarting any diabetic medications as appropriate; also, continue to  monitor her hypertension and possible titrating of her medicines if her  blood pressures do not come down with hemodialysis.      Asher Muir, MD  Electronically Signed      Wilber Bihari. Caryn Section, M.D.  Electronically Signed    SO/MEDQ  D:  01/17/2008  T:  01/18/2008  Job:  [id number]   cc:   [facility] Salvia. Eliott Nine, M.D.

## 2010-10-19 NOTE — Op Note (Signed)
NAMETZIPORAH, KNOKE NO.:  [id number]   MEDICAL RECORD NO.:  [id number]          PATIENT TYPE:  INP   LOCATION:  6704                         FACILITY:  [facility]   PHYSICIAN:  Quita Skye. Hart Rochester, M.D.  DATE OF BIRTH:  1932-05-13   DATE OF PROCEDURE:  01/16/2008  DATE OF DISCHARGE:                               OPERATIVE REPORT   PREOPERATIVE DIAGNOSIS:  End-stage renal disease.   POSTOPERATIVE DIAGNOSIS:  End-stage renal disease.   OPERATION:  Insertion of left forearm arteriovenous Gore-Tex graft  brachial artery to cephalic vein (4 mm - 7 mm stretch).   SURGEON:  Quita Skye. Hart Rochester, MD   FIRST ASSISTANT:  Jerold Coombe, PA   ANESTHESIA:  Local.   PROCEDURE:  The patient was taken to the operating room, placed in  supine position at which time the left upper extremity was prepped with  Betadine scrub and solution, and draped in routine sterile manner.  After infiltration with 1% Xylocaine, transverse incision was made in  the antecubital area.  Antecubital vein was dissected free.  The basilic  branch was small and adequate.  Cephalic branch was 4.5 mm in size.  Fogarty catheter was passed proximally and it was too deep for a fistula  and was borderline in size for insertion of a graft that thought to be  adequate.  Brachial artery was exposed beneath the fascia and encircled  with vessel loops and a 4 x 7 mm stretch Gore-Tex graft delivered  through a tunnel using a small counterincision at the apex of the loop.  No heparin was given.  Artery was occluded proximally and distally with  vessel loops, opened with 15 blade, and extended with Potts scissors.  A  4 mm end of the graft spatulated and anastomosed end-to-side with 6-0  Prolene.  A 7 mm end of the graft was spatulated and anastomosed end-to-  side to the vein with 6-0 Prolene.  Clamps were then released.  There  was an excellent pulse and thrill in the graft.  No protamine was given.  Wound was  irrigated with saline and closed in layers with Vicryl in  subcuticular fashion.  Sterile dressing applied.  The patient was taken  to recovery room in satisfactory condition.       Quita Skye Hart Rochester, M.D.  Electronically Signed     JDL/MEDQ  D:  01/16/2008  T:  01/17/2008  Job:  [id number]

## 2010-10-19 NOTE — H&P (Signed)
April Casey, BELLOT NO.:  [id number]   MEDICAL RECORD NO.:  [id number]          PATIENT TYPE:  INP   LOCATION:  6703                         FACILITY:  [facility]   PHYSICIAN:  Mindi Slicker. Lowell Guitar, M.D.  DATE OF BIRTH:  06-10-1931   DATE OF ADMISSION:  01/14/2008  DATE OF DISCHARGE:                              HISTORY & PHYSICAL   CHIEF COMPLAINT:  Weakness and decreased appetite.   The patient is a patient of Dr. Elza Rafter with known chronic kidney  disease who was scheduled to get a PermCath on Wednesday, January 16, 2008, and to start dialysis on the following day, Thursday, January 17, 2008.  However, today she presents to the emergency department via EMS  after having 5 days of decreased appetite and increased fatigue.  Today,  her leg started to feel like lead.  She denies nausea, vomiting,  shortness of breath, chest pain, or fevers.  She did have some itching  in her bilateral arms last night, but that has since resolved.  She  denies neurological changes as well.   PAST MEDICAL HISTORY:  1. Significant for chronic kidney disease.  She has a solitary      functioning kidney according to past notes.  Her most recent      creatinine on January 02, 2008, was 5.81.  2. Type 2 diabetes.  3. Hypertension.  4. Anemia.  5. Secondary hyperthyroidism.  6. Dyslipidemia.  7. Probable right MCA on June 15, 2007, and small vessel disease.      The patient is on Plavix for those.   ALLERGIES:  CODEINE causes nausea.   MEDICATIONS:  1. Aranesp, the patient does not know dose, every 2 weeks.  2. Calcitriol 0.25 mcg daily.  3. Verapamil SA 240 mg daily.  4. Glimepiride 1.25 mg daily, not certain of this dose, as the patient      reported as 1 mg daily and that does not come in that form.  5. Carvedilol 6.25 mg b.i.d.  6. Folic acid daily.  7. Januvia 100 mg p.o. daily.  8. Plavix 75 mg p.o. daily.  9. Furosemide 20 mg p.o. daily.  10.Crestor 40 mg p.o. daily.  11.Aspirin 81 mg p.o. daily.   SOCIAL HISTORY:  She lives in [place] with her husband.  She is a  retired Runner, broadcasting/film/video.  She has 12 children, but only 6 are living.  She has  no smoking history, does not drink alcohol or use illicit drugs.   FAMILY HISTORY:  Significant for mother with CVA and hypertension.  Father with CVA and hypertension.  Siblings with chronic kidney disease,  hypertension, and diabetes.  Children with hypertension.   REVIEW OF SYSTEMS:  Positive only for a 4-pound weight loss over the  past several weeks, decreased appetite, and fatigue per HPI.  The  patient also reports cold intolerance and generalized muscle weakness  over the past several days.  Rest of the remaining review of systems was  negative.   PHYSICAL EXAMINATION:  VITAL SIGNS:  Temperature 97.3, pulse 52,  respirations 16, blood pressure 125/64,  and oxygen saturation 96% on  room air.  GENERAL:  The patient is in no acute distress.  She is alert and  interactive and a good historian.  HEENT:  The patient's head is normocephalic and atraumatic.  Extraocular  movements are intact.  Her sclerae are clear.  Her tympanic membranes  are clear.  She has moist mucous membranes.  Her oropharynx is clear  with no erythema or exudate.  NECK:  Supple without lymphadenopathy.  No bruits or no JVD noted.  CARDIOVASCULAR:  Regular rate and rhythm with a normal S1 and S2.  No  murmurs or rubs are appreciated.  LUNGS:  Clear to auscultation bilaterally.  SKIN:  No rashes or lesions.  GI:  Abdomen is soft, nontender, and nondistended.  No rebound or  guarding.  No hepatosplenomegaly.  EXTREMITIES:  No clubbing, cyanosis, or edema.  She does have cold feet  with only 1+ dorsalis pedis pulses, and sensation is grossly intact.  ACCESS:  The patient does not have dialysis access at this time.  MUSCULOSKELETAL:  No joint deformities.  NEUROLOGIC:  Alert and oriented x3.  Cranial nerves II through XII are  grossly  intact.  No asterixis.  Muscle strength grossly is 5/5 in upper  and lower extremities.   STUDIES:  EKG showed no peaked T waves, did show some sinus bradycardia  with a prolonged QT interval.   LABORATORY DATA:  CBC with a white blood cell count of 11.8, hemoglobin  of 9.2, hematocrit 29.8, platelets of 263, 80% neutrophils, and ANC of  9.4.  CMP showed a sodium of 135, potassium 4.0, chloride 101, bicarb  15, BUN 142, creatinine 13.8, glucose 142, T bili 0.7, alk phos 72,  total protein 6.1, albumin 2.7, AST 244, ALT 178, calcium 8.1, PTT 28,  PT 13.8, and INR 1.0.   The patient is a 77 year old with chronic kidney disease, planning to  start dialysis this week who presents with 5 days of decreased appetite  and weakness.   PROBLEMS:  1. Decreased appetite and weakness.  Symptoms concerning for uremia.      The patient is acidotic with a bicarb of 15, anion gap is 19.  We      will plan to get a PermCath hopefully tomorrow and initiate      hemodialysis.  We will need to save her left arm for future access,      unfortunately she already gotten IV in that arm in the emergency      department, but in the future we will save left arm.  We will stop      Plavix and place her NPO in preparation for PermCath placement in      the morning.  We will call IR, the surgeons.  2. Chronic kidney disease.  Plan to get PermCath and initiate      hemodialysis as above.  3. Anemia.  We will continue Aranesp.  We will get records from the      [facility] office for dose and for additional history.  4. History of transient ischemic attack, on Plavix but we will hold      that for now.  We will continue aspirin secondary to      hyperparathyroidism.  Again, we will initiate hemodialysis.  We      will place her on calcitriol and monitor labs.  5. Diabetes.  For now, we will take her off oral hypoglycemics, place      her  on sliding scale insulin with q.4 h. checks and she is NPO.       Eventually, her insulin may need to be titrated up after she starts      eating.  6. For hypertension, we will continue her verapamil.       Asher Muir, MD  Electronically Signed      Mindi Slicker. Lowell Guitar, M.D.  Electronically Signed    SO/MEDQ  D:  01/15/2008  T:  01/15/2008  Job:  [id number]

## 2010-10-19 NOTE — Assessment & Plan Note (Signed)
OFFICE VISIT   April Casey, April Casey  DOB:  23-Nov-1931                                       03/11/2009  VOZDG#:[id number]   The patient is a 77 year old female referred by Dr. Lowell Guitar for  consideration of revision of her left forearm AV graft.  She had the  forearm graft placed in August of 2009.  Since that time it has occluded  in December of 2009 and again in June of 2010.  Each time there were  multiple levels of stenosis throughout the entire course of the cephalic  vein which is the main outflow vein for the graft.   PHYSICAL EXAM:  Today blood pressure is 132/75 in the right arm, pulse  is 78 and regular.  Left forearm AV graft has palpable pulse within it.  There was also an audible bruit.   I reviewed her shuntogram from June of 2010 which shows multiple  stenoses throughout the cephalic vein.  I believe the best option at  this point would be revision of her AV graft either into the basilic  system or deep brachial vein whichever vein would be more suitable.  If  these veins are not suitable to use for a graft then we would need to  consider placing an upper arm graft in the same setting.  If an upper  arm graft is placed then we would also place a catheter.  Hopefully we  will avoid the upper arm graft and catheter at this point.  All these  options were discussed with the patient today.  She understands and  agrees to proceed.  Her revision of her left forearm AV graft is  scheduled for Monday, 03/16/2009.   Janetta Hora. Fields, MD  Electronically Signed   CEF/MEDQ  D:  03/11/2009  T:  03/12/2009  Job:  [id number]   cc:   Mindi Slicker. Lowell Guitar, M.D.

## 2010-12-10 ENCOUNTER — Other Ambulatory Visit (HOSPITAL_COMMUNITY): Payer: Self-pay | Admitting: Nephrology

## 2010-12-10 DIAGNOSIS — N186 End stage renal disease: Secondary | ICD-10-CM

## 2010-12-17 ENCOUNTER — Ambulatory Visit (HOSPITAL_COMMUNITY)
Admission: RE | Admit: 2010-12-17 | Discharge: 2010-12-17 | Disposition: A | Discharge status: Home / Self Care | Payer: Medicare Other | Source: Ambulatory Visit | Attending: Nephrology | Admitting: Nephrology

## 2010-12-17 DIAGNOSIS — N186 End stage renal disease: Secondary | ICD-10-CM

## 2010-12-20 ENCOUNTER — Other Ambulatory Visit (HOSPITAL_COMMUNITY): Payer: Self-pay | Admitting: Family Medicine

## 2010-12-20 DIAGNOSIS — Z1231 Encounter for screening mammogram for malignant neoplasm of breast: Secondary | ICD-10-CM

## 2011-01-05 ENCOUNTER — Ambulatory Visit (HOSPITAL_COMMUNITY)
Admission: RE | Admit: 2011-01-05 | Discharge: 2011-01-05 | Disposition: A | Discharge status: Home / Self Care | Payer: Medicare Other | Source: Ambulatory Visit | Attending: Family Medicine | Admitting: Family Medicine

## 2011-01-05 DIAGNOSIS — Z1231 Encounter for screening mammogram for malignant neoplasm of breast: Secondary | ICD-10-CM | POA: Insufficient documentation

## 2011-01-10 ENCOUNTER — Ambulatory Visit (HOSPITAL_COMMUNITY)
Admission: RE | Admit: 2011-01-10 | Discharge: 2011-01-10 | Disposition: A | Discharge status: Home / Self Care | Payer: Medicare Other | Source: Ambulatory Visit | Attending: Vascular Surgery | Admitting: Vascular Surgery

## 2011-01-17 ENCOUNTER — Ambulatory Visit (HOSPITAL_COMMUNITY)
Admission: RE | Admit: 2011-01-17 | Discharge: 2011-01-17 | Disposition: A | Discharge status: Home / Self Care | Payer: Medicare Other | Source: Ambulatory Visit | Attending: Vascular Surgery | Admitting: Vascular Surgery

## 2011-01-17 DIAGNOSIS — T82898A Other specified complication of vascular prosthetic devices, implants and grafts, initial encounter: Secondary | ICD-10-CM

## 2011-01-17 DIAGNOSIS — I120 Hypertensive chronic kidney disease with stage 5 chronic kidney disease or end stage renal disease: Secondary | ICD-10-CM

## 2011-01-17 DIAGNOSIS — N186 End stage renal disease: Secondary | ICD-10-CM

## 2011-01-17 DIAGNOSIS — Y832 Surgical operation with anastomosis, bypass or graft as the cause of abnormal reaction of the patient, or of later complication, without mention of misadventure at the time of the procedure: Secondary | ICD-10-CM | POA: Insufficient documentation

## 2011-01-17 LAB — POCT I-STAT, CHEM 8
BUN: 45 mg/dL — ABNORMAL HIGH (ref 6–23)
Calcium, Ion: 1.19 mmol/L (ref 1.12–1.32)
Creatinine, Ser: 10 mg/dL — ABNORMAL HIGH (ref 0.50–1.10)
HCT: 38 % (ref 36.0–46.0)
HCT: 41 % (ref 36.0–46.0)
Hemoglobin: 13.9 g/dL (ref 12.0–15.0)
Potassium: 6.7 mEq/L (ref 3.5–5.1)
Sodium: 133 mEq/L — ABNORMAL LOW (ref 135–145)
Sodium: 140 mEq/L (ref 135–145)
TCO2: 28 mmol/L (ref 0–100)

## 2011-01-17 LAB — GLUCOSE, CAPILLARY: Glucose-Capillary: 67 mg/dL — ABNORMAL LOW (ref 70–99)

## 2011-01-31 ENCOUNTER — Ambulatory Visit (HOSPITAL_COMMUNITY)
Admission: RE | Admit: 2011-01-31 | Discharge: 2011-01-31 | Disposition: A | Discharge status: Home / Self Care | Payer: Medicare Other | Source: Ambulatory Visit | Attending: Vascular Surgery | Admitting: Vascular Surgery

## 2011-01-31 ENCOUNTER — Ambulatory Visit (HOSPITAL_COMMUNITY): Payer: Medicare Other

## 2011-01-31 DIAGNOSIS — I120 Hypertensive chronic kidney disease with stage 5 chronic kidney disease or end stage renal disease: Secondary | ICD-10-CM

## 2011-01-31 DIAGNOSIS — N186 End stage renal disease: Secondary | ICD-10-CM

## 2011-01-31 DIAGNOSIS — Y849 Medical procedure, unspecified as the cause of abnormal reaction of the patient, or of later complication, without mention of misadventure at the time of the procedure: Secondary | ICD-10-CM | POA: Insufficient documentation

## 2011-01-31 DIAGNOSIS — T82898A Other specified complication of vascular prosthetic devices, implants and grafts, initial encounter: Secondary | ICD-10-CM

## 2011-01-31 DIAGNOSIS — E119 Type 2 diabetes mellitus without complications: Secondary | ICD-10-CM | POA: Insufficient documentation

## 2011-01-31 DIAGNOSIS — Z992 Dependence on renal dialysis: Secondary | ICD-10-CM | POA: Insufficient documentation

## 2011-01-31 LAB — GLUCOSE, CAPILLARY: Glucose-Capillary: 56 mg/dL — ABNORMAL LOW (ref 70–99)

## 2011-02-01 LAB — GLUCOSE, CAPILLARY: Glucose-Capillary: 72 mg/dL (ref 70–99)

## 2011-02-01 LAB — POCT I-STAT 4, (NA,K, GLUC, HGB,HCT)
Glucose, Bld: 74 mg/dL (ref 70–99)
HCT: 44 % (ref 36.0–46.0)
Hemoglobin: 15 g/dL (ref 12.0–15.0)

## 2011-02-01 NOTE — Op Note (Signed)
April Casey, GOEHRING NO.:  [id number]  MEDICAL RECORD NO.:  [id number]  LOCATION:  SDSC                         FACILITY:  [facility]  PHYSICIAN:  Fransisco Hertz, MD       DATE OF BIRTH:  [birth year]  DATE OF PROCEDURE:  01/31/2011 DATE OF DISCHARGE:                              OPERATIVE REPORT   PROCEDURE:  Attempted left upper arm revision,  Left upper arm exploration.  PREOPERATIVE DIAGNOSIS:  Venous outflow stenosis, left upper arm and arteriovenous graft.  POSTOPERATIVE DIAGNOSIS:  Venous outflow stenosis, left upper arm and arteriovenous graft.  SURGEON:  Fransisco Hertz, MD  ASSISTANT:  Pecola Leisure, PA.  ANESTHESIA:  Monitored anesthesia care.  Findings in this case: a sclerotic segment of the axillary vein. The sufficiently large proximal target unfortunately is too deep in the chest/axilla to sew to.  SPECIMENS:  None.  ESTIMATED BLOOD LOSS:  Minimal.  INDICATIONS:  This is a 77 year old patient with a left upper arm arteriovenous graft, who has developed a pulsatile character to this left upper arm and additionally, she is beginning to develop some poor flow rates.  I brought her back to the [facility] Lab and did a fistulogram on his left upper arm arteriovenous graft and identified a segment in the upper arm that I felt was going to be amendable to surgical revision.  The patient is aware of the risks of this procedure including bleeding, infection, possible nerve damage, possible inability to complete the procedure, possible thrombosis of this graft, andpossible need to pursue other procedure.  She is aware of these risks and agrees to proceed forward.  DESCRIPTION OF OPERATION:  After full informed written consent was obtained from the patient, she was brought back to the operating room and placed supine upon the operating room table.  Prior to induction, she received IV antibiotics.  She was prepped and draped inthe  standard fashion for a  left arm access procedure.  I injected about 10 mL of a 1:1 mix of 0.5% Marcaine without epinephrine and 1% lidocaine with epinephrine in the axilla over the palpable graft and palpable venous outflow and then using blunt dissection and electrocautery, I developed a plane down to the vein.  I took care to dissect away the adjacent nerves that were retracted out of the way and protected.  I then continued to dissect proximally.  I clearly identified the segment of stenosis and eventually was able to identify the area of adequate diameter; however, by this point, we were well up into the pectoral/axillary region and I felt that would be impossible to sew a graft due to the depth and the location of the vein.  Subsequently, I decided to abort this case as I felt that possible further injury would occur with attempt to sew to this such a deep location.  Based on the patient's geometry, she does need intervention.  She is going to need, in my opinion, a Viabahn stent at this location to fully expand this segment of vein.  Also another reason I would recommend the Viabahn is due to its covered nature in case possible rupture of this vein in this freshly  dissected vein that the increased risk for possible extravasation into adjacent tissue.  At this point, the patient is still able to dialyze through this graft, so we still have some time to execute this.  We will let her heal up for a couple of weeks and then schedule her for that Viabahn stent placement in this graft. At this point, I irrigated out the wound.  There was no more  active bleeding.  The subcutaneous tissue was reapproximated with running  stitch of 3-0 Vicryl.  The skin was then reapproximated with running stitch  of 4-0 Vicryl.  The skin was then cleaned, dried, and Dermabond used to  reinforce the skin closure.  The patient tolerated this procedure well.  COMPLICATIONS:  None.  CONDITION:  Stable.     Fransisco Hertz,  MD     BLC/MEDQ  D:  01/31/2011  T:  01/31/2011  Job:  [id number]  Electronically Signed by Leonides Sake MD on 02/01/2011 06:29:46 PM

## 2011-02-01 NOTE — Op Note (Signed)
NAMEIOANNA, COLQUHOUN NO.:  [id number]  MEDICAL RECORD NO.:  [id number]  LOCATION:  SDSC                         FACILITY:  [facility]  PHYSICIAN:  Fransisco Hertz, MD       DATE OF BIRTH:  1932/01/03  DATE OF PROCEDURE:  01/17/2011 DATE OF DISCHARGE:  01/17/2011                              OPERATIVE REPORT   PROCEDURE: 1. Left upper arm arteriovenous graft cannulation under ultrasound     guidance. 2. Left arm shuntogram.  PREOPERATIVE DIAGNOSIS:  Venous outflow stenosis.  POSTOPERATIVE DIAGNOSIS:  Venous outflow stenosis.  SURGEON:  Fransisco Hertz, MD  ANESTHESIA:  Conscious sedation.  ESTIMATED BLOOD LOSS:  Minimal.  CONTRAST:  10 mL.  SPECIMENS:  None.  FINDINGS:  In this case included: 1. 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 2. A patent axillary subclavian and left innominate vein. 3. Patent superior vena cava.  INDICATIONS:  This is a 77 year old patient with a left upper arm arteriovenous graft that previously had developed pulsatile features.  I previously cannulated this graft and then angioplastied the area of stenosis in the outflow tract.  It was highly resistant and required use of a high pressure balloon and high pressure to eventually get dilation of his venous outflow.  At that point, there were small, contained ruptures, so I was not able to get complete resolution of the stenosis at that time and moved my feeling given the high pressure inflations that were needed to open up the vein that recurrent stenosis would likely require a surgical bypass.  The patient is aware of the risks of this procedure includes possible anaphylactic reaction to the graft, possible rupture of any angioplastied vessels, and possible thrombosis of this graft in the process of trying to intervene upon it.  The patient aware of these risks and agreed to proceed forward.  DESCRIPTION OF OPERATION:  After full  informed written consent was obtained from the patient, she was brought back to the angio suite, placed supine upon the angio table.  She was connected to a monitoring equipment and then given conscious sedation amounts of which are documented in our chart.  Turned my attention first to her antecubitum and under ultrasound guidance I identified the graft and cannulated it with a micropuncture needle.  A micro wire was passed into the graft.  The needle was exchanged for a micro sheath.  The wire was removed.  The sheath was connected to the IV extension tubing and then the sheath was secured in place with a Tegaderm.  Hand injections were completed to complete this patient's shuntogram.  Based on the presence of recurrent high-grade stenosis at the previous anoplasty site and previous rupture during the previous procedure, I do not think that there is any value to proceeding with venoplasty again.  I would recommend this case short segment interposition jump graft as the solution of this patient's venous outflow stenosis.  I discussed this with the patient and we will attempt to schedule for this coming Friday or next Monday depending on the availability of a surgeon.  COMPLICATIONS:  None.  CONDITION:  Stable.      Fransisco Hertz, MD     BLC/MEDQ  D:  01/17/2011  T:  01/17/2011  Job:  [id number]  Electronically Signed by Leonides Sake MD on 02/01/2011 04:54:09 PM

## 2011-02-24 LAB — CBC
HCT: 31.8 — ABNORMAL LOW
HCT: 27.8 — ABNORMAL LOW
MCHC: 33
MCV: 86.9
Platelets: 243
Platelets: 272
Platelets: 209
RBC: 3.36 — ABNORMAL LOW
RBC: 3.17 — ABNORMAL LOW
RBC: 3.21 — ABNORMAL LOW
RDW: 17.2 — ABNORMAL HIGH
WBC: 7.2
WBC: 5.8

## 2011-02-24 LAB — URINALYSIS, ROUTINE W REFLEX MICROSCOPIC
Glucose, UA: NEGATIVE
Ketones, ur: NEGATIVE
Leukocytes, UA: NEGATIVE
Nitrite: NEGATIVE
Specific Gravity, Urine: 1.013
pH: 6.5

## 2011-02-24 LAB — RENAL FUNCTION PANEL
Albumin: 2.9 — ABNORMAL LOW
BUN: 34 — ABNORMAL HIGH
CO2: 26
Calcium: 9.1
Creatinine, Ser: 2.18 — ABNORMAL HIGH
Creatinine, Ser: 2.31 — ABNORMAL HIGH
GFR calc Af Amer: 25 — ABNORMAL LOW
GFR calc Af Amer: 27 — ABNORMAL LOW
GFR calc non Af Amer: 21 — ABNORMAL LOW
GFR calc non Af Amer: 22 — ABNORMAL LOW
Glucose, Bld: 71
Phosphorus: 4.1
Potassium: 3.9

## 2011-02-24 LAB — BASIC METABOLIC PANEL
BUN: 31 — ABNORMAL HIGH
BUN: 42 — ABNORMAL HIGH
BUN: 46 — ABNORMAL HIGH
CO2: 25
CO2: 26
Chloride: 111
Chloride: 111
Chloride: 115 — ABNORMAL HIGH
Creatinine, Ser: 2.24 — ABNORMAL HIGH
Creatinine, Ser: 2.6 — ABNORMAL HIGH
GFR calc Af Amer: 26 — ABNORMAL LOW
Glucose, Bld: 191 — ABNORMAL HIGH
Glucose, Bld: 84
Potassium: 4

## 2011-02-24 LAB — POCT CARDIAC MARKERS
CKMB, poc: 2.1
Operator id: 285491
Troponin i, poc: <0.05

## 2011-02-24 LAB — URINE MICROSCOPIC-ADD ON

## 2011-02-24 LAB — COMPREHENSIVE METABOLIC PANEL
AST: 27
Albumin: 3.8
Alkaline Phosphatase: 58
BUN: 50 — ABNORMAL HIGH
Creatinine, Ser: 2.66 — ABNORMAL HIGH
GFR calc Af Amer: 21 — ABNORMAL LOW
Potassium: 3.6
Total Protein: 7

## 2011-02-24 LAB — IRON AND TIBC
Iron: 47
Saturation Ratios: 16 — ABNORMAL LOW
UIBC: 248

## 2011-02-24 LAB — DIFFERENTIAL
Lymphocytes Relative: 21
Monocytes Absolute: 0.5
Monocytes Relative: 7
Neutro Abs: 4.8

## 2011-02-24 LAB — APTT: aPTT: 29

## 2011-02-25 LAB — CBC
MCHC: 33.2
RDW: 16.3 — ABNORMAL HIGH

## 2011-02-28 ENCOUNTER — Ambulatory Visit (HOSPITAL_COMMUNITY)
Admission: RE | Admit: 2011-02-28 | Discharge: 2011-02-28 | Disposition: A | Discharge status: Home / Self Care | Payer: Medicare Other | Source: Ambulatory Visit | Attending: Vascular Surgery | Admitting: Vascular Surgery

## 2011-02-28 DIAGNOSIS — T82898A Other specified complication of vascular prosthetic devices, implants and grafts, initial encounter: Secondary | ICD-10-CM | POA: Insufficient documentation

## 2011-02-28 DIAGNOSIS — Z01810 Encounter for preprocedural cardiovascular examination: Secondary | ICD-10-CM | POA: Insufficient documentation

## 2011-02-28 DIAGNOSIS — Y832 Surgical operation with anastomosis, bypass or graft as the cause of abnormal reaction of the patient, or of later complication, without mention of misadventure at the time of the procedure: Secondary | ICD-10-CM | POA: Insufficient documentation

## 2011-02-28 DIAGNOSIS — I871 Compression of vein: Secondary | ICD-10-CM | POA: Insufficient documentation

## 2011-02-28 DIAGNOSIS — N186 End stage renal disease: Secondary | ICD-10-CM

## 2011-02-28 LAB — IRON AND TIBC
Iron: 65
Saturation Ratios: 20
TIBC: 321
UIBC: 256

## 2011-02-28 LAB — RENAL FUNCTION PANEL
BUN: 45 — ABNORMAL HIGH
CO2: 32
Calcium: 9.6
Creatinine, Ser: 2.47 — ABNORMAL HIGH
Glucose, Bld: 110 — ABNORMAL HIGH

## 2011-02-28 LAB — POCT I-STAT, CHEM 8
Creatinine, Ser: 9.2 mg/dL — ABNORMAL HIGH (ref 0.50–1.10)
Hemoglobin: 11.2 g/dL — ABNORMAL LOW (ref 12.0–15.0)
Potassium: 5 mEq/L (ref 3.5–5.1)
Sodium: 142 mEq/L (ref 135–145)

## 2011-02-28 LAB — GLUCOSE, CAPILLARY: Glucose-Capillary: 61 mg/dL — ABNORMAL LOW (ref 70–99)

## 2011-02-28 LAB — FERRITIN: Ferritin: 57 (ref 10–291)

## 2011-02-28 LAB — CBC
RBC: 3.44 — ABNORMAL LOW
WBC: 6.8

## 2011-03-01 LAB — CBC
Hemoglobin: 10.6 — ABNORMAL LOW
Hemoglobin: 10.9 — ABNORMAL LOW
MCHC: 33.1
MCHC: 33.8
MCV: 85.8
RBC: 3.71 — ABNORMAL LOW
RBC: 3.74 — ABNORMAL LOW

## 2011-03-01 LAB — FERRITIN: Ferritin: 55 (ref 10–291)

## 2011-03-01 LAB — IRON AND TIBC: Iron: 61

## 2011-03-01 NOTE — Op Note (Signed)
NAMEOREAN, GIARRATANO NO.:  [id number]  MEDICAL RECORD NO.:  [id number]  LOCATION:  [facility]SC                         FACILITY:  [facility]  PHYSICIAN:  Fransisco Hertz, MD       DATE OF BIRTH:  03-26-1932  DATE OF PROCEDURE:  02/28/2011 DATE OF DISCHARGE:  02/28/2011                              OPERATIVE REPORT   PROCEDURE: 1. Left upper arm arteriovenous graft cannulation under ultrasound     guidance. 2. Left arm shuntogram. 3. A stenting of the left cephalic vein with a 6 mm x 50 mm Viabahn     and a 6 mm x 20 mm iCAST.  PREOPERATIVE DIAGNOSIS:  Left upper arm venous outflow stenosis not amendable to surgical intervention.  POSTOPERATIVE DIAGNOSIS:  Left upper arm venous outflow stenosis not amendable to surgical intervention.  SURGEON:  Arlys John L. Imogene Burn, MD  ANESTHESIA:  Local.  ESTIMATED BLOOD LOSS:  Minimal.  CONTRAST:  25 mL.  SPECIMENS:  None.  FINDINGS:  A cephalic vein stenosis that was 75-99% throughout a 5.5 cm length that resolved to about 5 mm at the most narrow portion after stent deployment and angioplasty.  INDICATIONS:  This is 77 year old patient with end-stage renal disease that has a left upper arm graft that was poorly functioning on dialysis. I had shot a shuntogram on her, and this demonstrated venous outflow stenosis.  I felt that the patient would have been better managed with a surgical revision of her left upper arm arteriovenous graft and brought her back to the operating room.  Unfortunately on the table, it  became evident in fact the segment of good caliber vein was actually so far down in the axilla that she would be at risk for a complication including nerve damage and possible inability to technically complete the jump graft.  Subsequently, I aborted the surgical procedure and felt that she would need a covered stent treatment of this stenotic segment as on the surgical findings, this stenotic segment of vein was  quite scarred and at high risk for rupture with any type of intervention.  The patient is aware of the risks of this procedure including bleeding, infection, possible rupture of treated vessels, possible thrombosis of treated vessels, and possible need for additional procedures in the future.  She is aware of these risks and agreed to proceed forward.  DESCRIPTION OF THE OPERATION:  After full informed written consent was obtained from the patient, she was brought back to the angio suite, placed supine upon angio table.  She was connected to monitoring equipment and then her left arm was prepped and draped in a standard fashion for a left arm shuntogram.  I turned my attention to the distal third of her arteriovenous graft.  It was imaged under ultrasound guidance and cannulated with a micropuncture needle.  I passed the microwire into the graft without difficulty and the micro-sheath was loaded over the wire about 2 cm into the graft.  The wire was removed.  The micro-sheath was connected to the IV tubing and then hand injections completed the shuntogram identifying the area of stenosis and placed a Bentson wire through the sheath and through  the area of stenosis, exchanged out the sheath for a 6-French short sheath, which I loaded into the graft.  I then at this point measured out the length of the stenosis. The vein appeared  to be about 6.5 mm.  Subsequently, the plan was to use a 6-mm Viabahn stent  as it could easily fit through the 6-mm sheath.  Unfortunately, the 7-mm stent would require an 8-French sheath and subsequently, would be at high risk for damaging this graft.  Subsequently, I elected to go ahead and use the Viabahn.  I measured the length of the lesion.  Unfortunately, it was about 5.5 mm and to get adequate landing zone, I thought that an additional stent was going to be necessary.  Unfortunately, we did not have a Viabahn stent that was about 7 cm.  Subsequently, I  elected to use a iCAST stent to help with coverage.  I did a hand injection to once again re-image the area of stenosis then I first deployed the Viabahn proximally, and then I deployed the 6-mm iCAST with slight overlap distally and then using the 6-mm balloon from iCAST stent, I ballooned the entirety of this stent system.  Note that proximally, there was good expansion of the stent, but more distally there was some residual stenosis.  This is the result of the scarred portion of vein, which was visualized intraoperatively.  Subsequently, we ballooned it at higher pressures with the 6-mm balloon and was able to maintain a 5-mm lumen. At this point, pulse that was in this graft had completely resolved.  I subsequently felt that while not perfect it was an acceptable outcome, and then further attempts to further angioplasty this covered stent may result an injury to the surrounding vein, so at this point I decided that the adequate result was obtained.  I pulled out the wire and all balloons and I injected some local anesthetic around the sheath and then sewed a pursestring suture of 4-0 Monocryl around the sheath.  The sheath was pulled out while tying down this pursestring suture.  The patient tolerated this procedure fine with no complications, and condition stable.     Fransisco Hertz, MD     BLC/MEDQ  D:  02/28/2011  T:  02/28/2011  Job:  [id number]  Electronically Signed by Leonides Sake MD on 03/01/2011 10:38:54 AM

## 2011-03-03 LAB — FERRITIN: Ferritin: 73 (ref 10–291)

## 2011-03-03 LAB — CBC
Hemoglobin: 10.7 — ABNORMAL LOW
RBC: 3.75 — ABNORMAL LOW
WBC: 7.9

## 2011-03-03 LAB — IRON AND TIBC
Saturation Ratios: 21
TIBC: 305

## 2011-03-03 LAB — RENAL FUNCTION PANEL
Albumin: 3.3 — ABNORMAL LOW
Chloride: 102
GFR calc Af Amer: 17 — ABNORMAL LOW
GFR calc non Af Amer: 14 — ABNORMAL LOW
Potassium: 3.3 — ABNORMAL LOW
Sodium: 139

## 2011-03-04 LAB — RENAL FUNCTION PANEL
Albumin: 2.7 — ABNORMAL LOW
Albumin: 2.8 — ABNORMAL LOW
BUN: 145 — ABNORMAL HIGH
CO2: 16 — ABNORMAL LOW
CO2: 22
CO2: 24
Calcium: 9.5
Calcium: 8 — ABNORMAL LOW
Calcium: 8.6
Chloride: 100
Chloride: 99
Creatinine, Ser: 5.81 — ABNORMAL HIGH
Creatinine, Ser: 10.25 — ABNORMAL HIGH
Creatinine, Ser: 14.01 — ABNORMAL HIGH
GFR calc Af Amer: 9 — ABNORMAL LOW
GFR calc Af Amer: 4 — ABNORMAL LOW
GFR calc Af Amer: 4 — ABNORMAL LOW
GFR calc Af Amer: 5 — ABNORMAL LOW
GFR calc non Af Amer: 4 — ABNORMAL LOW
GFR calc non Af Amer: 4 — ABNORMAL LOW
Glucose, Bld: 213 — ABNORMAL HIGH
Glucose, Bld: 92
Phosphorus: 6.1 — ABNORMAL HIGH
Phosphorus: 11.5 — ABNORMAL HIGH
Phosphorus: 11.6 — ABNORMAL HIGH
Potassium: 4.2
Potassium: 4.2
Potassium: 4.3
Sodium: 140
Sodium: 137
Sodium: 138

## 2011-03-04 LAB — CBC
HCT: 29.8 — ABNORMAL LOW
Hemoglobin: 9.6 — ABNORMAL LOW
MCHC: 33.3
MCHC: 33.6
MCV: 87.7
Platelets: 262
Platelets: 232
Platelets: 263
RDW: 19 — ABNORMAL HIGH
WBC: 8.6

## 2011-03-04 LAB — GLUCOSE, CAPILLARY
Glucose-Capillary: 103 — ABNORMAL HIGH
Glucose-Capillary: 111 — ABNORMAL HIGH
Glucose-Capillary: 114 — ABNORMAL HIGH
Glucose-Capillary: 128 — ABNORMAL HIGH
Glucose-Capillary: 128 — ABNORMAL HIGH
Glucose-Capillary: 132 — ABNORMAL HIGH
Glucose-Capillary: 137 — ABNORMAL HIGH
Glucose-Capillary: 141 — ABNORMAL HIGH
Glucose-Capillary: 142 — ABNORMAL HIGH
Glucose-Capillary: 162 — ABNORMAL HIGH
Glucose-Capillary: 180 — ABNORMAL HIGH
Glucose-Capillary: 192 — ABNORMAL HIGH
Glucose-Capillary: 210 — ABNORMAL HIGH
Glucose-Capillary: 217 — ABNORMAL HIGH
Glucose-Capillary: 230 — ABNORMAL HIGH
Glucose-Capillary: 61 — ABNORMAL LOW
Glucose-Capillary: 62 — ABNORMAL LOW
Glucose-Capillary: 69 — ABNORMAL LOW
Glucose-Capillary: 88
Glucose-Capillary: 92
Glucose-Capillary: 95

## 2011-03-04 LAB — DIFFERENTIAL
Basophils Relative: 0
Eosinophils Absolute: 0
Monocytes Relative: 5

## 2011-03-04 LAB — BASIC METABOLIC PANEL
CO2: 21
GFR calc Af Amer: 5 — ABNORMAL LOW
GFR calc non Af Amer: 4 — ABNORMAL LOW
Glucose, Bld: 173 — ABNORMAL HIGH
Potassium: 3.4 — ABNORMAL LOW
Sodium: 135

## 2011-03-04 LAB — POCT I-STAT 3, ART BLOOD GAS (G3+)
Acid-base deficit: 7 — ABNORMAL HIGH
Bicarbonate: 17.8 — ABNORMAL LOW
O2 Saturation: 95
Patient temperature: 98.6
TCO2: 19
pCO2 arterial: 31.1 — ABNORMAL LOW
pH, Arterial: 7.365
pO2, Arterial: 78 — ABNORMAL LOW

## 2011-03-04 LAB — COMPREHENSIVE METABOLIC PANEL
AST: 244 — ABNORMAL HIGH
Albumin: 2.7 — ABNORMAL LOW
BUN: 142 — ABNORMAL HIGH
Calcium: 8.1 — ABNORMAL LOW
Chloride: 101
Creatinine, Ser: 13.81 — ABNORMAL HIGH
GFR calc Af Amer: 3 — ABNORMAL LOW
GFR calc non Af Amer: 3 — ABNORMAL LOW
Total Bilirubin: 0.7

## 2011-03-04 LAB — IRON AND TIBC
Saturation Ratios: 20
TIBC: 264
UIBC: 212

## 2011-03-04 LAB — PTH, INTACT AND CALCIUM: PTH: 131.3 — ABNORMAL HIGH

## 2011-03-04 LAB — FOLATE: Folate: >20

## 2011-03-04 LAB — PROTIME-INR: INR: 1

## 2011-03-04 LAB — FERRITIN: Ferritin: 147 (ref 10–291)

## 2011-03-07 LAB — DIFFERENTIAL
Lymphs Abs: 2.7
Monocytes Absolute: 0.7
Monocytes Relative: 12
Neutro Abs: 2.6
Neutrophils Relative %: 41 — ABNORMAL LOW

## 2011-03-07 LAB — CBC
Hemoglobin: 14.3
RBC: 4.91
WBC: 6.2

## 2011-03-07 LAB — POCT I-STAT, CHEM 8
BUN: 23
Calcium, Ion: 1.04 — ABNORMAL LOW
Chloride: 102
Creatinine, Ser: 6.9 — ABNORMAL HIGH
Glucose, Bld: 199 — ABNORMAL HIGH
Potassium: 3.6

## 2011-03-07 LAB — GLUCOSE, CAPILLARY: Glucose-Capillary: 228 — ABNORMAL HIGH

## 2011-12-15 ENCOUNTER — Other Ambulatory Visit (HOSPITAL_COMMUNITY): Payer: Self-pay | Admitting: Family Medicine

## 2013-04-18 ENCOUNTER — Encounter: Payer: Self-pay | Admitting: Interventional Cardiology

## 2013-04-18 ENCOUNTER — Encounter: Payer: Self-pay | Admitting: *Deleted

## 2013-04-18 DIAGNOSIS — D649 Anemia, unspecified: Secondary | ICD-10-CM | POA: Insufficient documentation

## 2013-04-18 DIAGNOSIS — M109 Gout, unspecified: Secondary | ICD-10-CM | POA: Insufficient documentation

## 2013-04-18 DIAGNOSIS — I10 Essential (primary) hypertension: Secondary | ICD-10-CM | POA: Insufficient documentation

## 2013-04-18 DIAGNOSIS — G459 Transient cerebral ischemic attack, unspecified: Secondary | ICD-10-CM | POA: Insufficient documentation

## 2013-04-18 DIAGNOSIS — E785 Hyperlipidemia, unspecified: Secondary | ICD-10-CM | POA: Insufficient documentation

## 2013-04-18 DIAGNOSIS — N183 Chronic kidney disease, stage 3 (moderate): Secondary | ICD-10-CM

## 2013-04-18 DIAGNOSIS — N179 Acute kidney failure, unspecified: Secondary | ICD-10-CM | POA: Insufficient documentation

## 2013-04-18 DIAGNOSIS — E119 Type 2 diabetes mellitus without complications: Secondary | ICD-10-CM | POA: Insufficient documentation

## 2013-04-19 ENCOUNTER — Ambulatory Visit (HOSPITAL_COMMUNITY): Payer: Medicare Other | Attending: Cardiology

## 2013-04-19 ENCOUNTER — Encounter: Payer: Self-pay | Admitting: Interventional Cardiology

## 2013-04-19 ENCOUNTER — Ambulatory Visit (INDEPENDENT_AMBULATORY_CARE_PROVIDER_SITE_OTHER): Payer: Medicare Other | Admitting: Interventional Cardiology

## 2013-04-19 ENCOUNTER — Encounter: Payer: Self-pay | Admitting: Cardiology

## 2013-04-19 VITALS — BP 150/70 | HR 80 | Ht 63.0 in | Wt 169.0 lb

## 2013-04-19 DIAGNOSIS — M79609 Pain in unspecified limb: Secondary | ICD-10-CM

## 2013-04-19 DIAGNOSIS — M7989 Other specified soft tissue disorders: Secondary | ICD-10-CM

## 2013-04-19 DIAGNOSIS — M109 Gout, unspecified: Secondary | ICD-10-CM

## 2013-04-19 DIAGNOSIS — E785 Hyperlipidemia, unspecified: Secondary | ICD-10-CM | POA: Insufficient documentation

## 2013-04-19 DIAGNOSIS — Z8673 Personal history of transient ischemic attack (TIA), and cerebral infarction without residual deficits: Secondary | ICD-10-CM | POA: Insufficient documentation

## 2013-04-19 DIAGNOSIS — R609 Edema, unspecified: Secondary | ICD-10-CM

## 2013-04-19 DIAGNOSIS — Z87891 Personal history of nicotine dependence: Secondary | ICD-10-CM | POA: Insufficient documentation

## 2013-04-19 DIAGNOSIS — I10 Essential (primary) hypertension: Secondary | ICD-10-CM | POA: Insufficient documentation

## 2013-04-19 DIAGNOSIS — Z0389 Encounter for observation for other suspected diseases and conditions ruled out: Secondary | ICD-10-CM

## 2013-04-19 DIAGNOSIS — I70219 Atherosclerosis of native arteries of extremities with intermittent claudication, unspecified extremity: Secondary | ICD-10-CM | POA: Insufficient documentation

## 2013-04-19 NOTE — Progress Notes (Signed)
Patient ID: April Casey, female   DOB: Apr 02, 1932, 79 y.o.   MRN: [id number]      Patient ID: April Casey MRN: [id number] DOB/AGE: 1931-06-15 79 y.o.   Referring Physician Dr. Cliffton Asters   Reason for Consultation peripheral vascular disease  HPI: 79 y/o who had a kidney transplant in 4/12.  She has done well since then.  She had gout before this time.  It is better.  She developed right calf pain with walking.  She can walk for 10 minutes in [employer] and then will have some pain.  SHe continues to walk.  She will have to rest a few minutes to have improvement.  No chest discomfort.  She had a preoperative stress test which was normal.    She can have pain in her house when walking for prolonged periods of time. She has not had any nonhealing sores on her right foot. She has had right leg swelling. She is attributed this to her previous gout. It has persisted even though the gout has not been an issue for several years.  Her most recent creatinine was 1.85 in May of 2014.  She denies any chest pain. She has not had lightheadedness or syncope. Her hemoglobin A1c has been elevated at about 8.0.   Current Outpatient Prescriptions  Medication Sig Dispense Refill  . allopurinol (ZYLOPRIM) 100 MG tablet Take 100 mg by mouth daily.      Marland Kitchen aspirin 81 MG tablet Take 81 mg by mouth daily.      . calcitRIOL (ROCALTROL) 0.25 MCG capsule Take 0.25 mcg by mouth daily.      . insulin glargine (LANTUS) 100 UNIT/ML injection Inject 25 Units into the skin at bedtime.      Marland Kitchen labetalol (NORMODYNE) 100 MG tablet Take 100 mg by mouth 2 (two) times daily.      Marland Kitchen levothyroxine (SYNTHROID, LEVOTHROID) 25 MCG tablet Take 25 mcg by mouth daily before breakfast.      . magnesium oxide (MAG-OX) 400 MG tablet Take 400 mg by mouth daily.      . meclizine (ANTIVERT) 25 MG tablet Take 25 mg by mouth 3 (three) times daily as needed for dizziness.      . mycophenolate (MYFORTIC) 180 MG EC tablet Take 180 mg by mouth daily.        Marland Kitchen omeprazole (PRILOSEC) 20 MG capsule Take 20 mg by mouth daily.      . predniSONE (DELTASONE) 5 MG tablet Take 5 mg by mouth daily with breakfast.      . rosuvastatin (CRESTOR) 20 MG tablet Take 20 mg by mouth daily.      . sitaGLIPtin (JANUVIA) 50 MG tablet Take 50 mg by mouth daily.      Marland Kitchen sulfamethoxazole-trimethoprim (BACTRIM,SEPTRA) 400-80 MG per tablet Take 1 tablet by mouth 2 (two) times daily.      . tacrolimus (PROGRAF) 1 MG capsule Take 1 mg by mouth 2 (two) times daily.      . traMADol (ULTRAM) 50 MG tablet Take by mouth every 6 (six) hours as needed.      . Vitamin D, Ergocalciferol, (DRISDOL) 50000 UNITS CAPS capsule Take 50,000 Units by mouth every 7 (seven) days.       No current facility-administered medications for this visit.   Past Medical History  Diagnosis Date  . Gout   . CKD (chronic kidney disease)   . Diabetes   . TIA (transient ischemic attack)   . Dyslipidemia   .  Anemia   . HTN (hypertension)     Family History  Problem Relation Age of Onset  . Hypertension Father   . Hypertension Mother   . Diabetes Brother     History   Social History  . Marital Status: Married    Spouse Name: N/A    Number of Children: N/A  . Years of Education: N/A   Occupational History  . Not on file.   Social History Main Topics  . Smoking status: Never Smoker   . Smokeless tobacco: Not on file  . Alcohol Use: No  . Drug Use: No  . Sexual Activity: Not on file   Other Topics Concern  . Not on file   Social History Narrative  . No narrative on file    Past Surgical History  Procedure Laterality Date  . Breast biopsy    . Tubal ligation    . Kidney transplant    . Av shunt for dialyisis        (Not in a hospital admission)  Review of systems complete and found to be negative unless listed above .  No nausea, vomiting.  No fever chills, No focal weakness,  No palpitations.  Claudication as noted above.  Physical Exam: Filed Vitals:   04/19/13  1100  BP: 150/70  Pulse: 80    Weight: 169 lb (76.658 kg)  Physical exam:  [place]/AT EOMI No JVD, No carotid bruit RRR S1S2  No wheezing Soft. NT, nondistended No edema. 2+ femoral pulses bilaterally, unable to palpate pedal pulses bilaterally; right leg swelling compared to left  No focal motor or sensory deficits Normal affect  Labs:   Lab Results  Component Value Date   WBC 8.5 10/25/2009   HGB 11.2* 02/28/2011   HCT 33.0* 02/28/2011   MCV 91.4 10/25/2009   PLT 182 10/25/2009   No results found for this basename: NA, K, CL, CO2, BUN, CREATININE, CALCIUM, LABALBU, PROT, BILITOT, ALKPHOS, ALT, AST, GLUCOSE,  in the last 168 hours No results found for this basename: CKTOTAL, CKMB, CKMBINDEX, TROPONINI    Lab Results  Component Value Date   CHOL  Value: 74        ATP III CLASSIFICATION:  <200     mg/dL   Desirable  045-409  mg/dL   Borderline High  >=811    mg/dL   High        02/18/7828   Lab Results  Component Value Date   HDL 23* 10/24/2009   Lab Results  Component Value Date   LDLCALC  Value: 31        Total Cholesterol/HDL:CHD Risk Coronary Heart Disease Risk Table                     Men   Women  1/2 Average Risk   3.4   3.3  Average Risk       5.0   4.4  2 X Average Risk   9.6   7.1  3 X Average Risk  23.4   11.0        Use the calculated Patient Ratio above and the CHD Risk Table to determine the patient's CHD Risk.        ATP III CLASSIFICATION (LDL):  <100     mg/dL   Optimal  562-130  mg/dL   Near or Above                    Optimal  130-159  mg/dL   Borderline  811-914  mg/dL   High  >782     mg/dL   Very High 9/56/2130   Lab Results  Component Value Date   TRIG 101 10/24/2009   Lab Results  Component Value Date   CHOLHDL 3.2 10/24/2009   No results found for this basename: LDLDIRECT      EKG: Normal sinus rhythm, LVH, nonspecific ST-T wave changes  ASSESSMENT AND PLAN:  1. Claudication: Based on exam, I would anticipate she has right SFA disease. I would be  hesitant to perform angiography due to her renal insufficiency and prior kidney transplant. We talked about increasing walking to help build collateral circulation.  She states that her transplant is on the right side. It's likely that this is tied into her right iliac system. There may be a natural siphoning of blood flow from the right leg to the transplanted kidney.  Check lower extremity arterial Doppler.  2. right leg edema: Will check venous Doppler to rule out DVT.    3.  chronic renal insufficiency: Baseline creatinine about 1.8. Would try to avoid angiography due to the risk of contrast nephropathy.  Status post kidney transplant. Signed:   Fredric Mare, MD, [facility] 04/19/2013, 11:18 AM

## 2013-04-19 NOTE — Patient Instructions (Signed)
Your physician has requested that you have a lower or upper extremity arterial duplex. This test is an ultrasound of the arteries in the legs or arms. It looks at arterial blood flow in the legs and arms. Allow one hour for Lower and Upper Arterial scans. There are no restrictions or special instructions  Your physician has requested that you have a lower or upper extremity venous duplex. This test is an ultrasound of the veins in the legs or arms. It looks at venous blood flow that carries blood from the heart to the legs or arms. Allow one hour for a Lower Venous exam. Allow thirty minutes for an Upper Venous exam. There are no restrictions or special instructions.

## 2013-04-22 ENCOUNTER — Encounter (HOSPITAL_COMMUNITY): Payer: Medicare Other

## 2013-05-10 ENCOUNTER — Ambulatory Visit (HOSPITAL_COMMUNITY): Payer: Medicare Other

## 2013-05-17 ENCOUNTER — Encounter (HOSPITAL_COMMUNITY): Payer: Medicare Other

## 2013-05-24 ENCOUNTER — Ambulatory Visit (HOSPITAL_COMMUNITY): Payer: Medicare Other | Attending: Interventional Cardiology

## 2013-05-24 ENCOUNTER — Encounter: Payer: Self-pay | Admitting: Cardiology

## 2013-05-24 DIAGNOSIS — I70219 Atherosclerosis of native arteries of extremities with intermittent claudication, unspecified extremity: Secondary | ICD-10-CM

## 2013-05-24 DIAGNOSIS — I739 Peripheral vascular disease, unspecified: Secondary | ICD-10-CM | POA: Insufficient documentation

## 2013-07-10 ENCOUNTER — Other Ambulatory Visit: Payer: Self-pay | Admitting: Family Medicine

## 2013-07-10 DIAGNOSIS — R0989 Other specified symptoms and signs involving the circulatory and respiratory systems: Secondary | ICD-10-CM

## 2013-07-15 ENCOUNTER — Ambulatory Visit
Admission: RE | Admit: 2013-07-15 | Discharge: 2013-07-15 | Disposition: A | Discharge status: Home / Self Care | Payer: Medicare Other | Source: Ambulatory Visit | Attending: Family Medicine | Admitting: Family Medicine

## 2013-07-15 DIAGNOSIS — R0989 Other specified symptoms and signs involving the circulatory and respiratory systems: Secondary | ICD-10-CM

## 2013-08-03 ENCOUNTER — Emergency Department (HOSPITAL_COMMUNITY)
Admission: EM | Admit: 2013-08-03 | Discharge: 2013-08-03 | Discharge status: Against Medical Advice | Payer: Medicare Other | Attending: Emergency Medicine | Admitting: Emergency Medicine

## 2013-08-03 ENCOUNTER — Encounter (HOSPITAL_COMMUNITY): Payer: Self-pay | Admitting: Emergency Medicine

## 2013-08-03 DIAGNOSIS — E119 Type 2 diabetes mellitus without complications: Secondary | ICD-10-CM | POA: Insufficient documentation

## 2013-08-03 DIAGNOSIS — N189 Chronic kidney disease, unspecified: Secondary | ICD-10-CM | POA: Insufficient documentation

## 2013-08-03 DIAGNOSIS — I129 Hypertensive chronic kidney disease with stage 1 through stage 4 chronic kidney disease, or unspecified chronic kidney disease: Secondary | ICD-10-CM | POA: Insufficient documentation

## 2013-08-03 LAB — CBG MONITORING, ED: Glucose-Capillary: 259 mg/dL — ABNORMAL HIGH (ref 70–99)

## 2013-08-03 NOTE — ED Notes (Signed)
The pt is here because her blood sugar is not staying down.  She takes insulin  Am and pm.  She last had it at 2200.  No reason for the high glucose

## 2013-08-03 NOTE — ED Notes (Signed)
The pt did not want to wait any longer.  She is leaving

## 2013-08-03 NOTE — ED Notes (Signed)
CBG checked 259

## 2014-01-21 ENCOUNTER — Other Ambulatory Visit (HOSPITAL_COMMUNITY): Payer: Self-pay | Admitting: Family Medicine

## 2014-01-21 DIAGNOSIS — Z1231 Encounter for screening mammogram for malignant neoplasm of breast: Secondary | ICD-10-CM

## 2014-01-30 ENCOUNTER — Ambulatory Visit (HOSPITAL_COMMUNITY): Payer: Medicare Other

## 2014-04-11 ENCOUNTER — Other Ambulatory Visit: Payer: Self-pay | Admitting: Family Medicine

## 2014-04-11 ENCOUNTER — Ambulatory Visit
Admission: RE | Admit: 2014-04-11 | Discharge: 2014-04-11 | Disposition: A | Discharge status: Home / Self Care | Payer: Medicare Other | Source: Ambulatory Visit | Attending: Family Medicine | Admitting: Family Medicine

## 2014-04-11 DIAGNOSIS — M79604 Pain in right leg: Secondary | ICD-10-CM

## 2014-05-15 ENCOUNTER — Other Ambulatory Visit: Payer: Self-pay | Admitting: Family Medicine

## 2014-05-15 DIAGNOSIS — M545 Low back pain: Secondary | ICD-10-CM

## 2014-05-15 DIAGNOSIS — M79604 Pain in right leg: Secondary | ICD-10-CM

## 2014-05-26 ENCOUNTER — Other Ambulatory Visit: Payer: Medicare Other

## 2014-06-12 ENCOUNTER — Encounter: Payer: Self-pay | Admitting: Neurology

## 2014-06-12 ENCOUNTER — Ambulatory Visit (INDEPENDENT_AMBULATORY_CARE_PROVIDER_SITE_OTHER): Payer: Medicare Other | Admitting: Neurology

## 2014-06-12 VITALS — BP 129/55 | HR 69 | Wt 156.2 lb

## 2014-06-12 DIAGNOSIS — R42 Dizziness and giddiness: Secondary | ICD-10-CM

## 2014-06-12 DIAGNOSIS — R55 Syncope and collapse: Secondary | ICD-10-CM

## 2014-06-12 DIAGNOSIS — H5310 Unspecified subjective visual disturbances: Secondary | ICD-10-CM

## 2014-06-12 DIAGNOSIS — I951 Orthostatic hypotension: Secondary | ICD-10-CM

## 2014-06-12 HISTORY — DX: Orthostatic hypotension: I95.1

## 2014-06-12 NOTE — Progress Notes (Signed)
Reason for visit: Dizziness  April Casey is a 81 y.o. female  History of present illness:  April Casey is an 81 year old right-handed black female with a history of episodic dizziness that dates back at least 6 years. Within the last year, she has had some difficulty with visual dimming as well associated with the dizziness. She indicates that the dizziness occurs on a daily basis, generally when she first gets up out of bed in the morning and tries to go to the bathroom. She will be up only a few moments, and then she will have a lightheaded sensation, and visual dimming. She will go back to bed and lie down, and the dizziness will improve. She may also get similar dizziness off and on throughout the day, and after she has been sitting for a period of time. The dizziness always occurs when she stands up, never while lying down or sitting. The patient has undergone a carotid Doppler study recently that does show atherosclerotic changes without flow-limiting stenosis. She had an MRI of the brain and MRA of the head in 2010. This did not show any critical stenosis of the basilar system. The patient denies any headache, but within the last day or 2, she has lost hearing in the left ear. She denies any focal numbness or weakness of the face, arms, or legs. She has developed some discomfort down the right leg with ambulation, and she will be having MRI evaluation of the lumbosacral spine in the near future. She is sent to this office for an evaluation. She does have a history of diabetes.  Past Medical History  Diagnosis Date  . Gout   . CKD (chronic kidney disease)   . Diabetes   . TIA (transient ischemic attack)   . Dyslipidemia   . Anemia   . HTN (hypertension)   . PVD (peripheral vascular disease)   . Orthostatic hypotension 06/12/2014    Past Surgical History  Procedure Laterality Date  . Breast biopsy    . Tubal ligation    . Kidney transplant    . Av shunt for dialyisis       Family History  Problem Relation Age of Onset  . Hypertension Father   . Stroke Father   . Hypertension Mother   . Stroke Mother   . Diabetes Brother   . Heart attack Brother   . Heart failure Sister   . Diabetes Sister   . Heart attack Brother   . Cancer Brother     Social history:  reports that she has never smoked. She has never used smokeless tobacco. She reports that she does not drink alcohol or use illicit drugs.  Medications:  Current Outpatient Prescriptions on File Prior to Visit  Medication Sig Dispense Refill  . allopurinol (ZYLOPRIM) 100 MG tablet Take 100 mg by mouth daily.    Marland Kitchen aspirin 81 MG tablet Take 81 mg by mouth daily.    . calcitRIOL (ROCALTROL) 0.25 MCG capsule Take 0.25 mcg by mouth daily.    . insulin glargine (LANTUS) 100 UNIT/ML injection Inject 25 Units into the skin at bedtime.    Marland Kitchen levothyroxine (SYNTHROID, LEVOTHROID) 25 MCG tablet Take 25 mcg by mouth daily before breakfast.    . magnesium oxide (MAG-OX) 400 MG tablet Take 400 mg by mouth daily.    . meclizine (ANTIVERT) 25 MG tablet Take 25 mg by mouth 3 (three) times daily as needed for dizziness.    . mycophenolate (MYFORTIC) 180  MG EC tablet Take 180 mg by mouth daily.     Marland Kitchen. omeprazole (PRILOSEC) 20 MG capsule Take 20 mg by mouth daily.    . predniSONE (DELTASONE) 5 MG tablet Take 5 mg by mouth daily with breakfast.    . rosuvastatin (CRESTOR) 20 MG tablet Take 20 mg by mouth daily.    . sitaGLIPtin (JANUVIA) 50 MG tablet Take 50 mg by mouth daily.    Marland Kitchen. sulfamethoxazole-trimethoprim (BACTRIM,SEPTRA) 400-80 MG per tablet Take 1 tablet by mouth 2 (two) times daily.    . tacrolimus (PROGRAF) 1 MG capsule Take 1 mg by mouth 2 (two) times daily.    . traMADol (ULTRAM) 50 MG tablet Take by mouth every 6 (six) hours as needed.    . Vitamin D, Ergocalciferol, (DRISDOL) 50000 UNITS CAPS capsule Take 50,000 Units by mouth every 7 (seven) days.     No current facility-administered medications on file  prior to visit.      Allergies  Allergen Reactions  . Amoxicillin     NAUSEATED  . Codeine     VOMITTING    ROS:  Out of a complete 14 system review of symptoms, the patient complains only of the following symptoms, and all other reviewed systems are negative.  Appetite change, excessive sweating Hearing loss, ringing in the ears, runny nose Blurred vision Restless legs, insomnia, daytime sleepiness Joint pain, walking difficulty, neck pain Dizziness  Blood pressure 129/55, pulse 69, weight 156 lb 3.2 oz (70.852 kg).   Blood pressure, right arm, standing is 98/40. Blood pressure, sitting, right arm is 122/60.  Physical Exam  General: The patient is alert and cooperative at the time of the examination.  Eyes: Pupils are equal, round, and reactive to light. Discs are flat bilaterally.  Neck: The neck is supple, no carotid bruits are noted.  Respiratory: The respiratory examination is clear.  Cardiovascular: The cardiovascular examination reveals a regular rate and rhythm, no obvious murmurs or rubs are noted.  Skin: Extremities are without significant edema.  Neurologic Exam  Mental status: The patient is alert and oriented x 3 at the time of the examination. The patient has apparent normal recent and remote memory, with an apparently normal attention span and concentration ability.  Cranial nerves: Facial symmetry is present. There is good sensation of the face to pinprick and soft touch bilaterally. The strength of the facial muscles and the muscles to head turning and shoulder shrug are normal bilaterally. Speech is well enunciated, no aphasia or dysarthria is noted. Extraocular movements are full. Visual fields are full. The tongue is midline, and the patient has symmetric elevation of the soft palate. No obvious hearing deficits are noted.  Motor: The motor testing reveals 5 over 5 strength of all 4 extremities. Good symmetric motor tone is noted  throughout.  Sensory: Sensory testing is intact to pinprick, soft touch, vibration sensation, and position sense on all 4 extremities. No evidence of extinction is noted.  Coordination: Cerebellar testing reveals good finger-nose-finger and heel-to-shin bilaterally.  Gait and station: Gait is normal. Tandem gait is normal. Romberg is negative. No drift is seen.  Reflexes: Deep tendon reflexes are symmetric, but are depressed bilaterally. Toes are downgoing bilaterally.   Carotid Doppler study 07/15/13:  IMPRESSION: Bilateral atherosclerotic plaque, left subjectively greater than right, not resulting in a hemodynamically significant stenosis.   Assessment/Plan:  1. Episodic dizziness, dimming  2. Orthostatic hypotension  3. Diabetes  The patient has a history of diabetes, and she likely has  some issues with an autonomic neuropathy. There is no evidence of a diabetic peripheral neuropathy on clinical examination. The patient clearly has some issues with orthostasis, dropping 24 points from sitting to standing. I have indicated that when she gets out of bed, she needs to sit at the bedside for several moments before she tries to walk. If she gets dizzy, she is to sit down immediately. Fortunately, she has not had any true episodes of syncope yet. She has had a carotid Doppler study, and she will be set up for MRI evaluation of the brain with MRA of the head. She will follow-up through this office if needed.  Marlan Palau MD 06/12/2014 8:21 PM  [facility] [address] [place], [place] [address]  Phone [phone] Fax [phone]

## 2014-06-12 NOTE — Patient Instructions (Signed)

## 2014-07-16 ENCOUNTER — Telehealth: Payer: Self-pay | Admitting: *Deleted

## 2014-07-16 NOTE — Telephone Encounter (Signed)
Patient daughter wanting to know if this patient still needs a CT scan done since she has not heard anything yet. The order is in they just not received a call. Please call April DoveMarie Casey on her cell at [phone]

## 2014-07-16 NOTE — Telephone Encounter (Signed)
I called the patient. I indicated that the MRI of the brain does need to be done, our scheduler has been sick, but we have somebody covering for her at this time, they should hear something soon about the MRI scheduling.

## 2014-08-07 ENCOUNTER — Other Ambulatory Visit: Payer: BC Managed Care – PPO

## 2014-08-07 ENCOUNTER — Telehealth: Payer: Self-pay | Admitting: *Deleted

## 2014-08-07 ENCOUNTER — Inpatient Hospital Stay: Admission: RE | Admit: 2014-08-07 | Payer: BC Managed Care – PPO | Source: Ambulatory Visit

## 2014-08-07 NOTE — Telephone Encounter (Signed)
Patient calling because she was scheduled today to have an open MRI. The patient states that [facility] told her that they not have an open machine and if she needed something to relax her to call here. The patient would like to know if she could have something so that she can have the MRI done. Please advise.

## 2014-08-07 NOTE — Telephone Encounter (Signed)
We will give her a packet of Xanax prior to the MRI.

## 2014-08-08 NOTE — Telephone Encounter (Signed)
Left message that patient can pick up a packet of Xanax prior to MRI, I asked her to return call and let us know when she will be coming.

## 2014-08-13 NOTE — Telephone Encounter (Addendum)
I spoke to patient and relayed that I will put the xanax at the front desk, but she needs to schedule her MRI before she picks it up.

## 2014-08-14 NOTE — Telephone Encounter (Signed)
Xanax has been placed at the front desk for patient.

## 2014-08-25 NOTE — Discharge Instructions (Signed)
Darbepoetin Alfa injection What is this medicine? DARBEPOETIN ALFA (dar be POE e tin AL fa) helps your body make more red blood cells. It is used to treat anemia caused by chronic kidney failure and chemotherapy. This medicine may be used for other purposes; ask your health care provider or pharmacist if you have questions. COMMON BRAND NAME(S): Aranesp What should I tell my health care provider before I take this medicine? They need to know if you have any of these conditions: -blood clotting disorders or history of blood clots -cancer patient not on chemotherapy -cystic fibrosis -heart disease, such as angina, heart failure, or a history of a heart attack -hemoglobin level of 12 g/dL or greater -high blood pressure -low levels of folate, iron, or vitamin B12 -seizures -an unusual or allergic reaction to darbepoetin, erythropoietin, albumin, hamster proteins, latex, other medicines, foods, dyes, or preservatives -pregnant or trying to get pregnant -breast-feeding How should I use this medicine? This medicine is for injection into a vein or under the skin. It is usually given by a health care professional in a hospital or clinic setting. If you get this medicine at home, you will be taught how to prepare and give this medicine. Do not shake the solution before you withdraw a dose. Use exactly as directed. Take your medicine at regular intervals. Do not take your medicine more often than directed. It is important that you put your used needles and syringes in a special sharps container. Do not put them in a trash can. If you do not have a sharps container, call your pharmacist or healthcare provider to get one. Talk to your pediatrician regarding the use of this medicine in children. While this medicine may be used in children as young as 1 year for selected conditions, precautions do apply. Overdosage: If you think you have taken too much of this medicine contact a poison control center or  emergency room at once. NOTE: This medicine is only for you. Do not share this medicine with others. What if I miss a dose? If you miss a dose, take it as soon as you can. If it is almost time for your next dose, take only that dose. Do not take double or extra doses. What may interact with this medicine? Do not take this medicine with any of the following medications: -epoetin alfa This list may not describe all possible interactions. Give your health care provider a list of all the medicines, herbs, non-prescription drugs, or dietary supplements you use. Also tell them if you smoke, drink alcohol, or use illegal drugs. Some items may interact with your medicine. What should I watch for while using this medicine? Visit your prescriber or health care professional for regular checks on your progress and for the needed blood tests and blood pressure measurements. It is especially important for the doctor to make sure your hemoglobin level is in the desired range, to limit the risk of potential side effects and to give you the best benefit. Keep all appointments for any recommended tests. Check your blood pressure as directed. Ask your doctor what your blood pressure should be and when you should contact him or her. As your body makes more red blood cells, you may need to take iron, folic acid, or vitamin B supplements. Ask your doctor or health care provider which products are right for you. If you have kidney disease continue dietary restrictions, even though this medication can make you feel better. Talk with your doctor or health   care professional about the foods you eat and the vitamins that you take. What side effects may I notice from receiving this medicine? Side effects that you should report to your doctor or health care professional as soon as possible: -allergic reactions like skin rash, itching or hives, swelling of the face, lips, or tongue -breathing problems -changes in vision -chest  pain -confusion, trouble speaking or understanding -feeling faint or lightheaded, falls -high blood pressure -muscle aches or pains -pain, swelling, warmth in the leg -rapid weight gain -severe headaches -sudden numbness or weakness of the face, arm or leg -trouble walking, dizziness, loss of balance or coordination -seizures (convulsions) -swelling of the ankles, feet, hands -unusually weak or tired Side effects that usually do not require medical attention (report to your doctor or health care professional if they continue or are bothersome): -diarrhea -fever, chills (flu-like symptoms) -headaches -nausea, vomiting -redness, stinging, or swelling at site where injected This list may not describe all possible side effects. Call your doctor for medical advice about side effects. You may report side effects to FDA at 1-800-FDA-1088. Where should I keep my medicine? Keep out of the reach of children. Store in a refrigerator between 2 and 8 degrees C (36 and 46 degrees F). Do not freeze. Do not shake. Throw away any unused portion if using a single-dose vial. Throw away any unused medicine after the expiration date. NOTE: This sheet is a summary. It may not cover all possible information. If you have questions about this medicine, talk to your doctor, pharmacist, or health care provider.  2015, [employer]vier/Gold Standard. (2008-05-06 10:23:57)  

## 2014-08-26 ENCOUNTER — Encounter (HOSPITAL_COMMUNITY)
Admission: RE | Admit: 2014-08-26 | Discharge: 2014-08-26 | Disposition: A | Discharge status: Home / Self Care | Payer: BC Managed Care – PPO | Source: Ambulatory Visit | Attending: Nephrology | Admitting: Nephrology

## 2014-08-26 DIAGNOSIS — N184 Chronic kidney disease, stage 4 (severe): Secondary | ICD-10-CM | POA: Diagnosis present

## 2014-08-26 DIAGNOSIS — Z5181 Encounter for therapeutic drug level monitoring: Secondary | ICD-10-CM | POA: Diagnosis not present

## 2014-08-26 DIAGNOSIS — Z79899 Other long term (current) drug therapy: Secondary | ICD-10-CM | POA: Insufficient documentation

## 2014-08-26 DIAGNOSIS — D631 Anemia in chronic kidney disease: Secondary | ICD-10-CM | POA: Insufficient documentation

## 2014-08-26 MED ORDER — DARBEPOETIN ALFA 100 MCG/0.5ML IJ SOSY
PREFILLED_SYRINGE | INTRAMUSCULAR | Status: AC
  Filled 2014-08-26: qty 0.5

## 2014-08-26 MED ORDER — DARBEPOETIN ALFA 100 MCG/0.5ML IJ SOSY
100.0000 ug | PREFILLED_SYRINGE | INTRAMUSCULAR | Status: DC
  Administered 2014-08-26: 100 ug via SUBCUTANEOUS

## 2014-09-01 LAB — POCT HEMOGLOBIN-HEMACUE: Hemoglobin: 8.1 g/dL — ABNORMAL LOW (ref 12.0–15.0)

## 2014-09-23 ENCOUNTER — Inpatient Hospital Stay (HOSPITAL_COMMUNITY): Admission: RE | Admit: 2014-09-23 | Payer: BC Managed Care – PPO | Source: Ambulatory Visit

## 2014-10-10 ENCOUNTER — Encounter (HOSPITAL_COMMUNITY)
Admission: RE | Admit: 2014-10-10 | Discharge: 2014-10-10 | Disposition: A | Discharge status: Home / Self Care | Payer: BC Managed Care – PPO | Source: Ambulatory Visit | Attending: Nephrology | Admitting: Nephrology

## 2014-10-10 DIAGNOSIS — D631 Anemia in chronic kidney disease: Secondary | ICD-10-CM | POA: Diagnosis not present

## 2014-10-10 DIAGNOSIS — Z5181 Encounter for therapeutic drug level monitoring: Secondary | ICD-10-CM | POA: Insufficient documentation

## 2014-10-10 DIAGNOSIS — N184 Chronic kidney disease, stage 4 (severe): Secondary | ICD-10-CM | POA: Insufficient documentation

## 2014-10-10 DIAGNOSIS — Z79899 Other long term (current) drug therapy: Secondary | ICD-10-CM | POA: Insufficient documentation

## 2014-10-10 LAB — IRON AND TIBC
Iron: 68 ug/dL (ref 28–170)
Saturation Ratios: 29 % (ref 10.4–31.8)
TIBC: 234 ug/dL — ABNORMAL LOW (ref 250–450)
UIBC: 166 ug/dL

## 2014-10-10 LAB — POCT HEMOGLOBIN-HEMACUE: Hemoglobin: 9.8 g/dL — ABNORMAL LOW (ref 12.0–15.0)

## 2014-10-10 LAB — FERRITIN: FERRITIN: 1041 ng/mL — AB (ref 11–307)

## 2014-10-10 MED ORDER — DARBEPOETIN ALFA 100 MCG/0.5ML IJ SOSY
100.0000 ug | PREFILLED_SYRINGE | INTRAMUSCULAR | Status: DC
  Administered 2014-10-10: 100 ug via SUBCUTANEOUS

## 2014-10-10 MED ORDER — DARBEPOETIN ALFA 100 MCG/0.5ML IJ SOSY
PREFILLED_SYRINGE | INTRAMUSCULAR | Status: AC
  Filled 2014-10-10: qty 0.5

## 2014-10-31 ENCOUNTER — Inpatient Hospital Stay (HOSPITAL_COMMUNITY)
Admission: EM | Admit: 2014-10-31 | Discharge: 2014-11-02 | DRG: 683 | Disposition: A | Discharge status: Home / Self Care | Payer: Medicare Other | Attending: Internal Medicine | Admitting: Internal Medicine

## 2014-10-31 ENCOUNTER — Encounter (HOSPITAL_COMMUNITY): Payer: Self-pay | Admitting: Emergency Medicine

## 2014-10-31 DIAGNOSIS — N179 Acute kidney failure, unspecified: Secondary | ICD-10-CM | POA: Diagnosis present

## 2014-10-31 DIAGNOSIS — E162 Hypoglycemia, unspecified: Secondary | ICD-10-CM | POA: Diagnosis present

## 2014-10-31 DIAGNOSIS — E785 Hyperlipidemia, unspecified: Secondary | ICD-10-CM | POA: Diagnosis present

## 2014-10-31 DIAGNOSIS — Z7982 Long term (current) use of aspirin: Secondary | ICD-10-CM

## 2014-10-31 DIAGNOSIS — Z8673 Personal history of transient ischemic attack (TIA), and cerebral infarction without residual deficits: Secondary | ICD-10-CM | POA: Diagnosis not present

## 2014-10-31 DIAGNOSIS — Z833 Family history of diabetes mellitus: Secondary | ICD-10-CM | POA: Diagnosis not present

## 2014-10-31 DIAGNOSIS — I129 Hypertensive chronic kidney disease with stage 1 through stage 4 chronic kidney disease, or unspecified chronic kidney disease: Secondary | ICD-10-CM | POA: Diagnosis present

## 2014-10-31 DIAGNOSIS — E119 Type 2 diabetes mellitus without complications: Secondary | ICD-10-CM

## 2014-10-31 DIAGNOSIS — N183 Chronic kidney disease, stage 3 unspecified: Secondary | ICD-10-CM | POA: Diagnosis present

## 2014-10-31 DIAGNOSIS — T8612 Kidney transplant failure: Secondary | ICD-10-CM | POA: Diagnosis present

## 2014-10-31 DIAGNOSIS — E11649 Type 2 diabetes mellitus with hypoglycemia without coma: Secondary | ICD-10-CM | POA: Diagnosis present

## 2014-10-31 LAB — CBC WITH DIFFERENTIAL/PLATELET
BASOS PCT: 1 % (ref 0–1)
Basophils Absolute: 0 10*3/uL (ref 0.0–0.1)
EOS ABS: 0.2 10*3/uL (ref 0.0–0.7)
Eosinophils Relative: 3 % (ref 0–5)
HEMATOCRIT: 32.3 % — AB (ref 36.0–46.0)
HEMOGLOBIN: 9.8 g/dL — AB (ref 12.0–15.0)
LYMPHS PCT: 24 % (ref 12–46)
Lymphs Abs: 1.2 10*3/uL (ref 0.7–4.0)
MCH: 27.3 pg (ref 26.0–34.0)
MCHC: 30.3 g/dL (ref 30.0–36.0)
MCV: 90 fL (ref 78.0–100.0)
Monocytes Absolute: 0.5 10*3/uL (ref 0.1–1.0)
Monocytes Relative: 10 % (ref 3–12)
NEUTROS PCT: 62 % (ref 43–77)
Neutro Abs: 3.2 10*3/uL (ref 1.7–7.7)
Platelets: 167 10*3/uL (ref 150–400)
RBC: 3.59 MIL/uL — ABNORMAL LOW (ref 3.87–5.11)
RDW: 13.9 % (ref 11.5–15.5)
WBC: 5.2 10*3/uL (ref 4.0–10.5)

## 2014-10-31 LAB — CBG MONITORING, ED
GLUCOSE-CAPILLARY: 50 mg/dL — AB (ref 65–99)
GLUCOSE-CAPILLARY: 104 mg/dL — AB (ref 65–99)
Glucose-Capillary: 56 mg/dL — ABNORMAL LOW (ref 65–99)
Glucose-Capillary: 167 mg/dL — ABNORMAL HIGH (ref 65–99)
Glucose-Capillary: 111 mg/dL — ABNORMAL HIGH (ref 65–99)
Glucose-Capillary: 95 mg/dL (ref 65–99)

## 2014-10-31 LAB — COMPREHENSIVE METABOLIC PANEL
ALT: 12 U/L — ABNORMAL LOW (ref 14–54)
AST: 23 U/L (ref 15–41)
Albumin: 3.4 g/dL — ABNORMAL LOW (ref 3.5–5.0)
Alkaline Phosphatase: 51 U/L (ref 38–126)
Anion gap: 14 (ref 5–15)
BUN: 38 mg/dL — ABNORMAL HIGH (ref 6–20)
CALCIUM: 9.5 mg/dL (ref 8.9–10.3)
CO2: 27 mmol/L (ref 22–32)
Chloride: 101 mmol/L (ref 101–111)
Creatinine, Ser: 3.97 mg/dL — ABNORMAL HIGH (ref 0.44–1.00)
GFR calc Af Amer: 11 mL/min — ABNORMAL LOW (ref >60)
GFR calc non Af Amer: 10 mL/min — ABNORMAL LOW (ref >60)
Glucose, Bld: 55 mg/dL — ABNORMAL LOW (ref 65–99)
Potassium: 3.6 mmol/L (ref 3.5–5.1)
Sodium: 142 mmol/L (ref 135–145)
TOTAL PROTEIN: 5.8 g/dL — AB (ref 6.5–8.1)
Total Bilirubin: 0.6 mg/dL (ref 0.3–1.2)

## 2014-10-31 LAB — GLUCOSE, CAPILLARY
Glucose-Capillary: 64 mg/dL — ABNORMAL LOW (ref 65–99)
Glucose-Capillary: 108 mg/dL — ABNORMAL HIGH (ref 65–99)
Glucose-Capillary: 131 mg/dL — ABNORMAL HIGH (ref 65–99)

## 2014-10-31 MED ORDER — TACROLIMUS 1 MG PO CAPS
1.0000 mg | ORAL_CAPSULE | Freq: Two times a day (BID) | ORAL | Status: DC
  Administered 2014-10-31 – 2014-11-02 (×4): 1 mg via ORAL
  Filled 2014-10-31 (×4): qty 1

## 2014-10-31 MED ORDER — LABETALOL HCL 100 MG PO TABS
300.0000 mg | ORAL_TABLET | Freq: Every day | ORAL | Status: DC
  Administered 2014-11-01 – 2014-11-02 (×2): 300 mg via ORAL
  Filled 2014-10-31 (×2): qty 3

## 2014-10-31 MED ORDER — INSULIN ASPART 100 UNIT/ML ~~LOC~~ SOLN: 0.0000 [IU] | Freq: Every day | SUBCUTANEOUS | Status: DC

## 2014-10-31 MED ORDER — MECLIZINE HCL 25 MG PO TABS
25.0000 mg | ORAL_TABLET | Freq: Three times a day (TID) | ORAL | Status: DC | PRN
  Filled 2014-10-31: qty 1

## 2014-10-31 MED ORDER — ASPIRIN EC 81 MG PO TBEC
81.0000 mg | DELAYED_RELEASE_TABLET | Freq: Every day | ORAL | Status: DC
  Administered 2014-11-02: 81 mg via ORAL
  Filled 2014-10-31 (×2): qty 1

## 2014-10-31 MED ORDER — INSULIN ASPART 100 UNIT/ML ~~LOC~~ SOLN
0.0000 [IU] | Freq: Three times a day (TID) | SUBCUTANEOUS | Status: DC
  Administered 2014-11-01: 1 [IU] via SUBCUTANEOUS

## 2014-10-31 MED ORDER — LABETALOL HCL 100 MG PO TABS
200.0000 mg | ORAL_TABLET | Freq: Every evening | ORAL | Status: DC
  Administered 2014-10-31 – 2014-11-01 (×2): 200 mg via ORAL
  Filled 2014-10-31 (×2): qty 2

## 2014-10-31 MED ORDER — ROSUVASTATIN CALCIUM 20 MG PO TABS
20.0000 mg | ORAL_TABLET | Freq: Every day | ORAL | Status: DC
  Administered 2014-11-01: 20 mg via ORAL
  Filled 2014-10-31 (×2): qty 1

## 2014-10-31 MED ORDER — ACETAMINOPHEN 325 MG PO TABS: 650.0000 mg | ORAL_TABLET | Freq: Four times a day (QID) | ORAL | Status: DC | PRN

## 2014-10-31 MED ORDER — MAGNESIUM OXIDE 400 MG PO TABS: 400.0000 mg | ORAL_TABLET | Freq: Every day | ORAL | Status: DC

## 2014-10-31 MED ORDER — DEXTROSE 50 % IV SOLN
1.0000 | Freq: Once | INTRAVENOUS | Status: AC
  Administered 2014-10-31: 50 mL via INTRAVENOUS
  Filled 2014-10-31: qty 50

## 2014-10-31 MED ORDER — LEVOTHYROXINE SODIUM 25 MCG PO TABS
25.0000 ug | ORAL_TABLET | Freq: Every day | ORAL | Status: DC
  Administered 2014-11-01 – 2014-11-02 (×2): 25 ug via ORAL
  Filled 2014-10-31 (×2): qty 1

## 2014-10-31 MED ORDER — HEPARIN SODIUM (PORCINE) 5000 UNIT/ML IJ SOLN
5000.0000 [IU] | Freq: Three times a day (TID) | INTRAMUSCULAR | Status: DC
  Administered 2014-10-31 – 2014-11-02 (×5): 5000 [IU] via SUBCUTANEOUS
  Filled 2014-10-31 (×5): qty 1

## 2014-10-31 MED ORDER — MAGNESIUM OXIDE 400 (241.3 MG) MG PO TABS
400.0000 mg | ORAL_TABLET | Freq: Every day | ORAL | Status: DC
  Administered 2014-11-02: 400 mg via ORAL
  Filled 2014-10-31 (×2): qty 1

## 2014-10-31 MED ORDER — PREDNISONE 5 MG PO TABS
5.0000 mg | ORAL_TABLET | Freq: Every day | ORAL | Status: DC
  Administered 2014-11-01 – 2014-11-02 (×2): 5 mg via ORAL
  Filled 2014-10-31 (×2): qty 1

## 2014-10-31 MED ORDER — CALCITRIOL 0.25 MCG PO CAPS
0.2500 ug | ORAL_CAPSULE | Freq: Every day | ORAL | Status: DC
  Administered 2014-11-02: 0.25 ug via ORAL
  Filled 2014-10-31 (×2): qty 1

## 2014-10-31 MED ORDER — DEXTROSE-NACL 5-0.45 % IV SOLN
INTRAVENOUS | Status: AC
  Administered 2014-10-31: 16:00:00 via INTRAVENOUS

## 2014-10-31 MED ORDER — DEXTROSE-NACL 5-0.45 % IV SOLN: INTRAVENOUS | Status: DC

## 2014-10-31 MED ORDER — ASPIRIN 81 MG PO TABS: 81.0000 mg | ORAL_TABLET | Freq: Every day | ORAL | Status: DC

## 2014-10-31 MED ORDER — TRAMADOL HCL 50 MG PO TABS: 50.0000 mg | ORAL_TABLET | Freq: Four times a day (QID) | ORAL | Status: DC | PRN

## 2014-10-31 MED ORDER — MYCOPHENOLATE SODIUM 180 MG PO TBEC
180.0000 mg | DELAYED_RELEASE_TABLET | Freq: Every day | ORAL | Status: DC
  Administered 2014-11-02: 180 mg via ORAL
  Filled 2014-10-31 (×2): qty 1

## 2014-10-31 MED ORDER — ALLOPURINOL 100 MG PO TABS
100.0000 mg | ORAL_TABLET | Freq: Every day | ORAL | Status: DC
  Administered 2014-11-02: 100 mg via ORAL
  Filled 2014-10-31 (×2): qty 1

## 2014-10-31 MED ORDER — ONDANSETRON HCL 4 MG/2ML IJ SOLN
4.0000 mg | Freq: Once | INTRAMUSCULAR | Status: DC
  Filled 2014-10-31: qty 2

## 2014-10-31 MED ORDER — PANTOPRAZOLE SODIUM 40 MG PO TBEC
40.0000 mg | DELAYED_RELEASE_TABLET | Freq: Every day | ORAL | Status: DC
  Administered 2014-11-01 – 2014-11-02 (×2): 40 mg via ORAL
  Filled 2014-10-31 (×2): qty 1

## 2014-10-31 NOTE — ED Provider Notes (Signed)
CSN: [id number]     Arrival date & time 10/31/14  4098 History   First MD Initiated Contact with Patient 10/31/14 1011     Chief Complaint  Patient presents with  . Hypoglycemia     (Consider location/radiation/quality/duration/timing/severity/associated sxs/prior Treatment) Patient is a 81 y.o. female presenting with hypoglycemia.  Hypoglycemia Initial blood sugar:  50 Severity:  Severe Onset quality:  Gradual Duration:  3 days Timing:  Intermittent Chronicity:  New Diabetic status:  Controlled with insulin Current diabetic therapy:  25 units Lantus at night.  no other insulin Context: decreased oral intake and diet changes (daughter and pt report she has been less consistent with her diet.)   Relieved by:  IV glucose Associated symptoms: altered mental status (confused this morning before family were able to give orange juice.  this improved AMS.)     Past Medical History  Diagnosis Date  . Gout   . CKD (chronic kidney disease)   . Diabetes   . TIA (transient ischemic attack)   . Dyslipidemia   . Anemia   . HTN (hypertension)   . PVD (peripheral vascular disease)   . Orthostatic hypotension 06/12/2014   Past Surgical History  Procedure Laterality Date  . Breast biopsy    . Tubal ligation    . Kidney transplant    . Av shunt for dialyisis     Family History  Problem Relation Age of Onset  . Hypertension Father   . Stroke Father   . Hypertension Mother   . Stroke Mother   . Diabetes Brother   . Heart attack Brother   . Heart failure Sister   . Diabetes Sister   . Heart attack Brother   . Cancer Brother    History  Substance Use Topics  . Smoking status: Never Smoker   . Smokeless tobacco: Never Used  . Alcohol Use: No   OB History    No data available     Review of Systems  All other systems reviewed and are negative.     Allergies  Amoxicillin and Codeine  Home Medications   Prior to Admission medications   Medication Sig Start Date End  Date Taking? Authorizing Provider  acetaminophen (TYLENOL) 325 MG tablet Take 650 mg by mouth every 6 (six) hours as needed (pain).   Yes Historical Provider, MD  allopurinol (ZYLOPRIM) 100 MG tablet Take 100 mg by mouth daily.   Yes Historical Provider, MD  aspirin 81 MG tablet Take 81 mg by mouth daily.   Yes Historical Provider, MD  calcitRIOL (ROCALTROL) 0.25 MCG capsule Take 0.25 mcg by mouth daily.   Yes Historical Provider, MD  insulin glargine (LANTUS) 100 UNIT/ML injection Inject 25 Units into the skin at bedtime.   Yes Historical Provider, MD  labetalol (NORMODYNE) 200 MG tablet Take 200 mg by mouth every evening.  04/03/14  Yes Historical Provider, MD  labetalol (NORMODYNE) 300 MG tablet Take 300 mg by mouth daily with breakfast.  04/03/14  Yes Historical Provider, MD  levothyroxine (SYNTHROID, LEVOTHROID) 25 MCG tablet Take 25 mcg by mouth daily before breakfast.   Yes Historical Provider, MD  magnesium oxide (MAG-OX) 400 MG tablet Take 400 mg by mouth daily.   Yes Historical Provider, MD  mycophenolate (MYFORTIC) 180 MG EC tablet Take 180 mg by mouth daily.    Yes Historical Provider, MD  omeprazole (PRILOSEC) 20 MG capsule Take 20 mg by mouth daily.   Yes Historical Provider, MD  predniSONE (DELTASONE) 5 MG  tablet Take 5 mg by mouth daily with breakfast.   Yes Historical Provider, MD  rosuvastatin (CRESTOR) 20 MG tablet Take 20 mg by mouth daily.   Yes Historical Provider, MD  sitaGLIPtin (JANUVIA) 50 MG tablet Take 50 mg by mouth daily.   Yes Historical Provider, MD  tacrolimus (PROGRAF) 1 MG capsule Take 1 mg by mouth 2 (two) times daily.   Yes Historical Provider, MD  meclizine (ANTIVERT) 25 MG tablet Take 25 mg by mouth 3 (three) times daily as needed for dizziness.    Historical Provider, MD  traMADol (ULTRAM) 50 MG tablet Take 50 mg by mouth every 6 (six) hours as needed (pain).     Historical Provider, MD   BP 120/47 mmHg  Pulse 79  Temp(Src) 98.1 F (36.7 C) (Oral)  Resp  18  Ht  (1.6 m)  Wt 151 lb (68.493 kg)  BMI 26.76 kg/m2  SpO2 98% Physical Exam  Constitutional: She is oriented to person, place, and time. She appears well-developed and well-nourished. No distress.  HENT:  Head: Normocephalic and atraumatic.  Mouth/Throat: Oropharynx is clear and moist.  Eyes: Conjunctivae are normal. Pupils are equal, round, and reactive to light. No scleral icterus.  Neck: Neck supple.  Cardiovascular: Normal rate, regular rhythm, normal heart sounds and intact distal pulses.   No murmur heard. Pulmonary/Chest: Effort normal and breath sounds normal. No stridor. No respiratory distress. She has no rales.  Abdominal: Soft. Bowel sounds are normal. She exhibits no distension. There is no tenderness.  Musculoskeletal: Normal range of motion.  Neurological: She is alert and oriented to person, place, and time.  Skin: Skin is warm and dry. No rash noted.  Psychiatric: She has a normal mood and affect. Her behavior is normal.  Nursing note and vitals reviewed.   ED Course  Procedures (including critical care time) Labs Review Labs Reviewed  CBC WITH DIFFERENTIAL/PLATELET - Abnormal; Notable for the following:    RBC 3.59 (*)    Hemoglobin 9.8 (*)    HCT 32.3 (*)    All other components within normal limits  COMPREHENSIVE METABOLIC PANEL - Abnormal; Notable for the following:    Glucose, Bld 55 (*)    BUN 38 (*)    Creatinine, Ser 3.97 (*)    Total Protein 5.8 (*)    Albumin 3.4 (*)    ALT 12 (*)    GFR calc non Af Amer 10 (*)    GFR calc Af Amer 11 (*)    All other components within normal limits  CBG MONITORING, ED - Abnormal; Notable for the following:    Glucose-Capillary 56 (*)    All other components within normal limits  CBG MONITORING, ED - Abnormal; Notable for the following:    Glucose-Capillary 50 (*)    All other components within normal limits  CBG MONITORING, ED - Abnormal; Notable for the following:    Glucose-Capillary 167 (*)     All other components within normal limits  CBG MONITORING, ED - Abnormal; Notable for the following:    Glucose-Capillary 111 (*)    All other components within normal limits  CBG MONITORING, ED - Abnormal; Notable for the following:    Glucose-Capillary 104 (*)    All other components within normal limits  CBG MONITORING, ED    Imaging Review No results found.   EKG Interpretation None      MDM   Final diagnoses:  Acute kidney injury  Hypoglycemia    82  yo female with hypoglycemia.  By history, it sounds like she has had dietary changes, which are likely this cause.  No other concerning signs or symptoms.  Plan to PO challenge and will monitor sugars.  Blood sugars continued to slowly drop.  She was unable to eat adequately in ED due to poor appetite.  Review of her Cr reveals baseline of 1.8-2.5.  Today, Cr is about 4.  Plan admit for fluids and monitoring.    Blake DivineJohn Arwin Bisceglia, MD 10/31/14 [phone]

## 2014-10-31 NOTE — H&P (Signed)
[facility] History and Physical  MILENA LIGGETT ZOX:[id number] DOB: February 08, 1932 DOA: 10/31/2014  Referring physician: er PCP: Cala Bradford, MD   Chief Complaint: low blood sugars  HPI: April Casey is a 81 y.o. female with h/o renal transplant and DM. She lives at home with a niece who has cancer.  She does all of her ADLs.  She had several readings of low blood sugars- 24, 39.  She has been taking her insulin and PO meds as prescribed,  Denies fever, chills, SOB, CP, no N/V/D.  Her left leg has been hurting more than usual but patient still has been able to drink and eat normally.  She does not think her urine output has been decreased or noticed a change of color.  Family says she has decreased intake.  Patient states she has not felt well for "years".  Family says this AM she was confused, they gave OJ with improvement.  In the ER, her blood sugars continued to be low, her cr was elevated above her baseline and hospitalist were asked to admit for low blood sugar and AKI      Review of Systems:  All systems reviewed, negative unless stated above   Past Medical History  Diagnosis Date  . Gout   . CKD (chronic kidney disease)   . Diabetes   . TIA (transient ischemic attack)   . Dyslipidemia   . Anemia   . HTN (hypertension)   . PVD (peripheral vascular disease)   . Orthostatic hypotension 06/12/2014   Past Surgical History  Procedure Laterality Date  . Breast biopsy    . Tubal ligation    . Kidney transplant    . Av shunt for dialyisis     Social History:  reports that she has never smoked. She has never used smokeless tobacco. She reports that she does not drink alcohol or use illicit drugs.  Allergies  Allergen Reactions  . Amoxicillin     NAUSEATED  . Codeine     VOMITTING    Family History  Problem Relation Age of Onset  . Hypertension Father   . Stroke Father   . Hypertension Mother   . Stroke Mother   . Diabetes Brother   . Heart attack Brother    . Heart failure Sister   . Diabetes Sister   . Heart attack Brother   . Cancer Brother     Prior to Admission medications   Medication Sig Start Date End Date Taking? Authorizing Provider  acetaminophen (TYLENOL) 325 MG tablet Take 650 mg by mouth every 6 (six) hours as needed (pain).   Yes Historical Provider, MD  allopurinol (ZYLOPRIM) 100 MG tablet Take 100 mg by mouth daily.   Yes Historical Provider, MD  aspirin 81 MG tablet Take 81 mg by mouth daily.   Yes Historical Provider, MD  calcitRIOL (ROCALTROL) 0.25 MCG capsule Take 0.25 mcg by mouth daily.   Yes Historical Provider, MD  insulin glargine (LANTUS) 100 UNIT/ML injection Inject 25 Units into the skin at bedtime.   Yes Historical Provider, MD  labetalol (NORMODYNE) 200 MG tablet Take 200 mg by mouth every evening.  04/03/14  Yes Historical Provider, MD  labetalol (NORMODYNE) 300 MG tablet Take 300 mg by mouth daily with breakfast.  04/03/14  Yes Historical Provider, MD  levothyroxine (SYNTHROID, LEVOTHROID) 25 MCG tablet Take 25 mcg by mouth daily before breakfast.   Yes Historical Provider, MD  magnesium oxide (MAG-OX) 400 MG tablet Take 400 mg  by mouth daily.   Yes Historical Provider, MD  mycophenolate (MYFORTIC) 180 MG EC tablet Take 180 mg by mouth daily.    Yes Historical Provider, MD  omeprazole (PRILOSEC) 20 MG capsule Take 20 mg by mouth daily.   Yes Historical Provider, MD  predniSONE (DELTASONE) 5 MG tablet Take 5 mg by mouth daily with breakfast.   Yes Historical Provider, MD  rosuvastatin (CRESTOR) 20 MG tablet Take 20 mg by mouth daily.   Yes Historical Provider, MD  sitaGLIPtin (JANUVIA) 50 MG tablet Take 50 mg by mouth daily.   Yes Historical Provider, MD  tacrolimus (PROGRAF) 1 MG capsule Take 1 mg by mouth 2 (two) times daily.   Yes Historical Provider, MD  meclizine (ANTIVERT) 25 MG tablet Take 25 mg by mouth 3 (three) times daily as needed for dizziness.    Historical Provider, MD  traMADol (ULTRAM) 50 MG  tablet Take 50 mg by mouth every 6 (six) hours as needed (pain).     Historical Provider, MD   Physical Exam: Filed Vitals:   10/31/14 1130 10/31/14 1230 10/31/14 1330 10/31/14 1400  BP: 151/54 161/49 158/49 156/58  Pulse: 72 77 75 79  Temp:      TempSrc:      Resp: 17 23 12 17   Height:      Weight:      SpO2: 100% 99% 98% 98%    Wt Readings from Last 3 Encounters:  10/31/14 68.493 kg (151 lb)  06/12/14 70.852 kg (156 lb 3.2 oz)  08/03/13 76.204 kg (168 lb)    General:  Appears calm and comfortable Eyes: PERRL, normal lids, irises & conjunctiva ENT: grossly normal hearing, lips & tongue Neck: no LAD, masses or thyromegaly Cardiovascular: RRR, no m/r/g. No LE edema. Telemetry: SR, no arrhythmias  Respiratory: CTA bilaterally, no w/r/r. Normal respiratory effort. Abdomen: soft, ntnd Skin: no rash or induration seen on limited exam Musculoskeletal: grossly normal tone BUE/BLE Psychiatric: grossly normal mood and affect, speech fluent and appropriate Neurologic: grossly non-focal.          Labs on Admission:  Basic Metabolic Panel:  Recent Labs Lab 10/31/14 1010  NA 142  K 3.6  CL 101  CO2 27  GLUCOSE 55*  BUN 38*  CREATININE 3.97*  CALCIUM 9.5   Liver Function Tests:  Recent Labs Lab 10/31/14 1010  AST 23  ALT 12*  ALKPHOS 51  BILITOT 0.6  PROT 5.8*  ALBUMIN 3.4*   No results for input(s): LIPASE, AMYLASE in the last 168 hours. No results for input(s): AMMONIA in the last 168 hours. CBC:  Recent Labs Lab 10/31/14 1010  WBC 5.2  NEUTROABS 3.2  HGB 9.8*  HCT 32.3*  MCV 90.0  PLT 167   Cardiac Enzymes: No results for input(s): CKTOTAL, CKMB, CKMBINDEX, TROPONINI in the last 168 hours.  BNP (last 3 results) No results for input(s): BNP in the last 8760 hours.  ProBNP (last 3 results) No results for input(s): PROBNP in the last 8760 hours.  CBG:  Recent Labs Lab 10/31/14 1034 10/31/14 1109 10/31/14 1227 10/31/14 1329 10/31/14 1418    GLUCAP 50* 167* 111* 104* 95    Radiological Exams on Admission: No results found.    Assessment/Plan Active Problems:   CKD (chronic kidney disease) stage 3, GFR 30-59 ml/min   Diabetes   Acute kidney injury   Hypoglycemia   Hypoglycemia- suspect related to continued lantus use with worsening kidney function -D5IVF -monitor glucose -diabetic diet  AKI  on CKD s/p renal transplant (baseline Cr from [facility] was about 2.67 in April) - IVF -if CR not improved in AM, will need renal consult  DM -SSI -hold PO and SQ meds      Code Status: full DVT Prophylaxis: Family Communication: family at bedside Disposition Plan:   Time spent: 65 min  Marlin Canary [facility] Pager [phone]

## 2014-10-31 NOTE — ED Notes (Addendum)
Pt c/o low blood sugar x 3 days. Pt had readings of 24 and 39. Family unable to check blood sugar today because they don't know how to work the monitor. For 2 days EMS came out and checked pt blood sugar. CBG 56 in triage.

## 2014-11-01 DIAGNOSIS — E11649 Type 2 diabetes mellitus with hypoglycemia without coma: Secondary | ICD-10-CM

## 2014-11-01 DIAGNOSIS — I10 Essential (primary) hypertension: Secondary | ICD-10-CM

## 2014-11-01 DIAGNOSIS — N183 Chronic kidney disease, stage 3 (moderate): Secondary | ICD-10-CM

## 2014-11-01 DIAGNOSIS — N179 Acute kidney failure, unspecified: Principal | ICD-10-CM

## 2014-11-01 DIAGNOSIS — E162 Hypoglycemia, unspecified: Secondary | ICD-10-CM

## 2014-11-01 LAB — BASIC METABOLIC PANEL
Anion gap: 13 (ref 5–15)
BUN: 31 mg/dL — ABNORMAL HIGH (ref 6–20)
CALCIUM: 9.2 mg/dL (ref 8.9–10.3)
CO2: 25 mmol/L (ref 22–32)
Chloride: 101 mmol/L (ref 101–111)
Creatinine, Ser: 3.62 mg/dL — ABNORMAL HIGH (ref 0.44–1.00)
GFR calc Af Amer: 13 mL/min — ABNORMAL LOW (ref >60)
GFR, EST NON AFRICAN AMERICAN: 11 mL/min — AB (ref >60)
GLUCOSE: 151 mg/dL — AB (ref 65–99)
POTASSIUM: 3.7 mmol/L (ref 3.5–5.1)
Sodium: 139 mmol/L (ref 135–145)

## 2014-11-01 LAB — CBC
HCT: 33.8 % — ABNORMAL LOW (ref 36.0–46.0)
Hemoglobin: 10.7 g/dL — ABNORMAL LOW (ref 12.0–15.0)
MCH: 27.7 pg (ref 26.0–34.0)
MCHC: 31.7 g/dL (ref 30.0–36.0)
MCV: 87.6 fL (ref 78.0–100.0)
Platelets: 132 10*3/uL — ABNORMAL LOW (ref 150–400)
RBC: 3.86 MIL/uL — ABNORMAL LOW (ref 3.87–5.11)
RDW: 13.8 % (ref 11.5–15.5)
WBC: 4.2 10*3/uL (ref 4.0–10.5)

## 2014-11-01 LAB — GLUCOSE, CAPILLARY
GLUCOSE-CAPILLARY: 174 mg/dL — AB (ref 65–99)
GLUCOSE-CAPILLARY: 179 mg/dL — AB (ref 65–99)
Glucose-Capillary: 134 mg/dL — ABNORMAL HIGH (ref 65–99)
Glucose-Capillary: 149 mg/dL — ABNORMAL HIGH (ref 65–99)
Glucose-Capillary: 187 mg/dL — ABNORMAL HIGH (ref 65–99)
Glucose-Capillary: 59 mg/dL — ABNORMAL LOW (ref 65–99)
Glucose-Capillary: 79 mg/dL (ref 65–99)

## 2014-11-01 LAB — HEMOGLOBIN A1C
Hgb A1c MFr Bld: 6.5 % — ABNORMAL HIGH (ref 4.8–5.6)
Mean Plasma Glucose: 140 mg/dL

## 2014-11-01 MED ORDER — DEXTROSE-NACL 5-0.9 % IV SOLN
INTRAVENOUS | Status: DC
  Administered 2014-11-01: 18:00:00 via INTRAVENOUS
  Administered 2014-11-02: 1 mL via INTRAVENOUS

## 2014-11-01 NOTE — Progress Notes (Signed)
[facility] PROGRESS NOTE  SEJAL COFIELD ZOX:[id number] DOB: 1931/06/21 DOA: 10/31/2014 PCP: Cala Bradford, MD  Assessment/Plan: 1. Type 2 diabetes mellitus with hypoglycemia -Patient reporting blood sugars in the 20's - 30's at home as she had been on Januvia 50 mg PO q daily for diabetes management.  -Labs showed the presence of renal failure initially presenting with a creatinine of 3.97. I suspect drug levels accumulating and causing hypoglycemia with decreased creatinine clearance. -Oral hypoglycemics discontinued on admission. -Continue checking blood sugars, which came down to 59 this. Will continue to monitor for the next 24 hours ensuring she does not have further hypoglycemic episodes  2.  Acute on Chronic kidney disease -Unknown baseline creatinine. Initial labs revealing a creatinine of 3.97 with BUN of 38. Creatinine improving this morning with labs showing creatinine of 3.62. I discussed case with Dr. Hyman Hopes of nephrology who will be returning to his office this afternoon at which point he can access labs done in the nephrology clinic to get an idea of her baseline creatinine.   3.  History of renal transplant -Continue immunosuppressive therapy with tacrolimus 1 mg by mouth twice a day, prednisone 5 mg by mouth daily, mycophenolate 180 mg by mouth daily. -Awaiting labs from the nephrology clinic  4.  Hypertension -Systolic blood pressures in the 140s to 150s -Continue labetalol 300 mg by mouth every a.m. and 200 mg by mouth every afternoon  Code Status: Full code Family Communication:  Disposition Plan: Will monitor blood sugars for the next 24 hours, anticipate discharge in a.m.   Consultants:  Telephone consultation to nephrology   HPI/Subjective: Patient is a pleasant 81 year old female with a past medical history of renal transplant, diabetes mellitus who had been taking taking Januvia for control of her diabetes, who was admitted to the medicine service on  10/31/2014 when she presented with complaints of hypoglycemia. Patient having blood sugar readings in the 20s to 30s at home. Initial labs revealed a creatinine of 3.97 with BUN of 38 as hypoglycemia likely resulted from decrease clearance from acute on chronic kidney disease. On the following day creatinine improved to 3.62. I spoke with Dr. Hyman Hopes of Nephrology today who will review labs done at the office to see exactly what her baseline creatinine is.  Objective: Filed Vitals:   11/01/14 0500  BP: 156/53  Pulse: 81  Temp: 99.1 F (37.3 C)  Resp: 19    Intake/Output Summary (Last 24 hours) at 11/01/14 1309 Last data filed at 11/01/14 0500  Gross per 24 hour  Intake 1920.84 ml  Output      0 ml  Net 1920.84 ml   Filed Weights   10/31/14 0936  Weight: 68.493 kg (151 lb)    Exam:   General:  Patient states feeling better she is awake and alert no acute distress, she is mentating well  Cardiovascular: Regular rate and rhythm normal S1-S2  Respiratory: Normal respiratory effort, lungs are clear to auscultation bilaterally  Abdomen: Soft nontender no senna positive bowel sounds  Musculoskeletal: No edema  Data Reviewed: Basic Metabolic Panel:  Recent Labs Lab 10/31/14 1010 11/01/14 0344  NA 142 139  K 3.6 3.7  CL 101 101  CO2 27 25  GLUCOSE 55* 151*  BUN 38* 31*  CREATININE 3.97* 3.62*  CALCIUM 9.5 9.2   Liver Function Tests:  Recent Labs Lab 10/31/14 1010  AST 23  ALT 12*  ALKPHOS 51  BILITOT 0.6  PROT 5.8*  ALBUMIN 3.4*   No  results for input(s): LIPASE, AMYLASE in the last 168 hours. No results for input(s): AMMONIA in the last 168 hours. CBC:  Recent Labs Lab 10/31/14 1010 11/01/14 0344  WBC 5.2 4.2  NEUTROABS 3.2  --   HGB 9.8* 10.7*  HCT 32.3* 33.8*  MCV 90.0 87.6  PLT 167 132*   Cardiac Enzymes: No results for input(s): CKTOTAL, CKMB, CKMBINDEX, TROPONINI in the last 168 hours. BNP (last 3 results) No results for input(s): BNP in  the last 8760 hours.  ProBNP (last 3 results) No results for input(s): PROBNP in the last 8760 hours.  CBG:  Recent Labs Lab 11/01/14 0046 11/01/14 0550 11/01/14 0749 11/01/14 1155 11/01/14 1238  GLUCAP 187* 149* 134* 59* 79    No results found for this or any previous visit (from the past 240 hour(s)).   Studies: No results found.  Scheduled Meds: . allopurinol  100 mg Oral Daily  . aspirin EC  81 mg Oral Daily  . calcitRIOL  0.25 mcg Oral Daily  . dextrose 5 % and 0.45% NaCl   Intravenous STAT  . heparin  5,000 Units Subcutaneous 3 times per day  . insulin aspart  0-5 Units Subcutaneous QHS  . insulin aspart  0-9 Units Subcutaneous TID WC  . labetalol  200 mg Oral QPM  . labetalol  300 mg Oral Q breakfast  . levothyroxine  25 mcg Oral QAC breakfast  . magnesium oxide  400 mg Oral Daily  . mycophenolate  180 mg Oral Daily  . ondansetron (ZOFRAN) IV  4 mg Intravenous Once  . pantoprazole  40 mg Oral Daily  . predniSONE  5 mg Oral Q breakfast  . rosuvastatin  20 mg Oral q1800  . tacrolimus  1 mg Oral BID   Continuous Infusions:   Active Problems:   CKD (chronic kidney disease) stage 3, GFR 30-59 ml/min   Diabetes   Acute kidney injury   Hypoglycemia    Time spent: 35 minutes    Jeralyn BennettZAMORA, Ossie Beltran  [facility] Pager [phone]. If 7PM-7AM, please contact night-coverage at [email or web address], password [facility] 11/01/2014, 1:09 PM  LOS: 1 day

## 2014-11-01 NOTE — Progress Notes (Signed)
Pt requesting to go to [facility] to visit sister who is in the hospital. Obtained order from Dr. Vanessa BarbaraZamora.  CBG just checked and it is 174, family wheeling pt over in wheelchair to see sister and they have my phone # if need anything.

## 2014-11-02 LAB — BASIC METABOLIC PANEL
ANION GAP: 10 (ref 5–15)
BUN: 23 mg/dL — ABNORMAL HIGH (ref 6–20)
CO2: 23 mmol/L (ref 22–32)
CREATININE: 3.03 mg/dL — AB (ref 0.44–1.00)
Calcium: 9.5 mg/dL (ref 8.9–10.3)
Chloride: 109 mmol/L (ref 101–111)
GFR, EST AFRICAN AMERICAN: 16 mL/min — AB (ref >60)
GFR, EST NON AFRICAN AMERICAN: 14 mL/min — AB (ref >60)
GLUCOSE: 177 mg/dL — AB (ref 65–99)
Potassium: 4.1 mmol/L (ref 3.5–5.1)
Sodium: 142 mmol/L (ref 135–145)

## 2014-11-02 LAB — GLUCOSE, CAPILLARY
GLUCOSE-CAPILLARY: 146 mg/dL — AB (ref 65–99)
Glucose-Capillary: 191 mg/dL — ABNORMAL HIGH (ref 65–99)

## 2014-11-02 MED ORDER — LABETALOL HCL 100 MG PO TABS: 300.0000 mg | ORAL_TABLET | Freq: Every day | ORAL | Status: DC

## 2014-11-02 MED ORDER — INSULIN GLARGINE 100 UNIT/ML ~~LOC~~ SOLN: 15.0000 [IU] | Freq: Every day | SUBCUTANEOUS | Status: DC

## 2014-11-02 MED ORDER — LABETALOL HCL 100 MG PO TABS: 300.0000 mg | ORAL_TABLET | Freq: Once | ORAL | Status: DC

## 2014-11-02 MED ORDER — GABAPENTIN 100 MG PO CAPS: 100.0000 mg | ORAL_CAPSULE | Freq: Three times a day (TID) | ORAL | Status: DC

## 2014-11-02 NOTE — Discharge Summary (Signed)
Physician Discharge Summary  April Casey [id number] DOB: 12-13-1931 DOA: 10/31/2014  PCP: Cala Bradford, MD  Admit date: 10/31/2014 Discharge date: 11/02/2014  Time spent: 35 minutes  Recommendations for Outpatient Follow-up:  1. Please follow-up on blood sugars, she presented with hypoglycemia. Januvia was discontinued on discharge as her home Lantus was decreased to 15 units subcutaneous daily 2. Follow up on BMP on hospital follow-up visit, she was found to be acute on chronic renal failure, creatinine improving to 3.03 on day of discharge (initially presenting with creatinine of 3.97 on 10/31/2014)  Discharge Diagnoses:  Active Problems:   CKD (chronic kidney disease) stage 3, GFR 30-59 ml/min   Diabetes   Acute kidney injury   Hypoglycemia   Discharge Condition: Stable/improved  Diet recommendation: Carbohydrate modified diet  Filed Weights   10/31/14 0936  Weight: 68.493 kg (151 lb)    History of present illness:  April Casey is a 81 y.o. female with h/o renal transplant and DM. She lives at home with a niece who has cancer. She does all of her ADLs. She had several readings of low blood sugars- 24, 39. She has been taking her insulin and PO meds as prescribed, Denies fever, chills, SOB, CP, no N/V/D. Her left leg has been hurting more than usual but patient still has been able to drink and eat normally. She does not think her urine output has been decreased or noticed a change of color. Family says she has decreased intake. Patient states she has not felt well for "years". Family says this AM she was confused, they gave OJ with improvement.  Hospital Course:  Patient is a pleasant 81 year old female with a past medical history of renal transplant, diabetes mellitus who had been taking taking Januvia for control of her diabetes, who was admitted to the medicine service on 10/31/2014 when she presented with complaints of hypoglycemia. Patient having blood  sugar readings in the 20s to 30s at home. Initial labs revealed a creatinine of 3.97 with BUN of 38 as hypoglycemia likely resulted from decrease clearance from acute on chronic kidney disease. She had been on Lantus 25 units sub-cutaneous daily at bedtime and Januvia. On the following day creatinine improved to 3.62. I spoke with Dr. Hyman Hopes of Nephrology who reviewed labs done at the office, reporting that her baseline creatinine was near 2.8-2.9.  She was maintained on IV fluids as her creatinine continued to trend down to 3.03 by 11/02/2014. Her blood sugars remain stable. She reported feeling much better and expressed desire to be discharged today. Follow-up on blood sugars as her Januvia was discontinued on discharge and Lantus decreased from 25 units to 15 units simultaneous daily.    Consultations:  Telephone consultation made to Dr. Hyman Hopes of nephrology  Discharge Exam: Filed Vitals:   11/02/14 0830  BP: 168/58  Pulse: 83  Temp:   Resp: 19    General: Patient is awake, alert, no acute distress, states feeling better, anxious to go home today. Cardiovascular: Regular rate and rhythm normal S1 is 2 no murmurs rubs or gallops Respiratory: Lungs are clear to auscultation bilaterally no wheezing rhonchi rales Abdomen: Soft nontender nondistended Extremities: No edema   Discharge Instructions   Discharge Instructions    Call MD for:  difficulty breathing, headache or visual disturbances    Complete by:  As directed      Call MD for:  extreme fatigue    Complete by:  As directed      Call  MD for:  hives    Complete by:  As directed      Call MD for:  persistant dizziness or light-headedness    Complete by:  As directed      Call MD for:  persistant nausea and vomiting    Complete by:  As directed      Call MD for:  redness, tenderness, or signs of infection (pain, swelling, redness, odor or green/yellow discharge around incision site)    Complete by:  As directed      Call MD for:   severe uncontrolled pain    Complete by:  As directed      Call MD for:  temperature >100.4    Complete by:  As directed      Diet - low sodium heart healthy    Complete by:  As directed      Increase activity slowly    Complete by:  As directed           Current Discharge Medication List    START taking these medications   Details  gabapentin (NEURONTIN) 100 MG capsule Take 1 capsule (100 mg total) by mouth 3 (three) times daily. Qty: 90 capsule, Refills: 0      CONTINUE these medications which have CHANGED   Details  insulin glargine (LANTUS) 100 UNIT/ML injection Inject 0.15 mLs (15 Units total) into the skin at bedtime. Qty: 10 mL, Refills: 11      CONTINUE these medications which have NOT CHANGED   Details  acetaminophen (TYLENOL) 325 MG tablet Take 650 mg by mouth every 6 (six) hours as needed (pain).    allopurinol (ZYLOPRIM) 100 MG tablet Take 100 mg by mouth daily.    aspirin 81 MG tablet Take 81 mg by mouth daily.    calcitRIOL (ROCALTROL) 0.25 MCG capsule Take 0.25 mcg by mouth daily.    !! labetalol (NORMODYNE) 200 MG tablet Take 200 mg by mouth every evening.  Refills: 5    !! labetalol (NORMODYNE) 300 MG tablet Take 300 mg by mouth daily with breakfast.  Refills: 5    levothyroxine (SYNTHROID, LEVOTHROID) 25 MCG tablet Take 25 mcg by mouth daily before breakfast.    magnesium oxide (MAG-OX) 400 MG tablet Take 400 mg by mouth daily.    mycophenolate (MYFORTIC) 180 MG EC tablet Take 180 mg by mouth daily.     omeprazole (PRILOSEC) 20 MG capsule Take 20 mg by mouth daily.    predniSONE (DELTASONE) 5 MG tablet Take 5 mg by mouth daily with breakfast.    rosuvastatin (CRESTOR) 20 MG tablet Take 20 mg by mouth daily.    tacrolimus (PROGRAF) 1 MG capsule Take 1 mg by mouth 2 (two) times daily.    meclizine (ANTIVERT) 25 MG tablet Take 25 mg by mouth 3 (three) times daily as needed for dizziness.     !! - Potential duplicate medications found. Please  discuss with provider.    STOP taking these medications     sitaGLIPtin (JANUVIA) 50 MG tablet      traMADol (ULTRAM) 50 MG tablet        Allergies  Allergen Reactions  . Amoxicillin     NAUSEATED  . Codeine     VOMITTING      The results of significant diagnostics from this hospitalization (including imaging, microbiology, ancillary and laboratory) are listed below for reference.    Significant Diagnostic Studies: No results found.  Microbiology: No results found for this or any previous  visit (from the past 240 hour(s)).   Labs: Basic Metabolic Panel:  Recent Labs Lab 10/31/14 1010 11/01/14 0344 11/02/14 0910  NA 142 139 142  K 3.6 3.7 4.1  CL 101 101 109  CO2 27 25 23   GLUCOSE 55* 151* 177*  BUN 38* 31* 23*  CREATININE 3.97* 3.62* 3.03*  CALCIUM 9.5 9.2 9.5   Liver Function Tests:  Recent Labs Lab 10/31/14 1010  AST 23  ALT 12*  ALKPHOS 51  BILITOT 0.6  PROT 5.8*  ALBUMIN 3.4*   No results for input(s): LIPASE, AMYLASE in the last 168 hours. No results for input(s): AMMONIA in the last 168 hours. CBC:  Recent Labs Lab 10/31/14 1010 11/01/14 0344  WBC 5.2 4.2  NEUTROABS 3.2  --   HGB 9.8* 10.7*  HCT 32.3* 33.8*  MCV 90.0 87.6  PLT 167 132*   Cardiac Enzymes: No results for input(s): CKTOTAL, CKMB, CKMBINDEX, TROPONINI in the last 168 hours. BNP: BNP (last 3 results) No results for input(s): BNP in the last 8760 hours.  ProBNP (last 3 results) No results for input(s): PROBNP in the last 8760 hours.  CBG:  Recent Labs Lab 11/01/14 1155 11/01/14 1238 11/01/14 1634 11/01/14 2132 11/02/14 0715  GLUCAP 59* 79 174* 179* 191*       Signed:  Luane Rochon  [facility] 11/02/2014, 11:05 AM

## 2014-11-02 NOTE — Progress Notes (Signed)
Pt BP was 173/70, 177/60, asymptomatic, unable to sleep for 2 nights, paged on call MD waiting for his response.

## 2014-11-07 ENCOUNTER — Encounter (HOSPITAL_COMMUNITY)
Admission: RE | Admit: 2014-11-07 | Discharge: 2014-11-07 | Disposition: A | Discharge status: Home / Self Care | Payer: BC Managed Care – PPO | Source: Ambulatory Visit | Attending: Nephrology | Admitting: Nephrology

## 2014-11-07 ENCOUNTER — Other Ambulatory Visit: Payer: Self-pay | Admitting: Family Medicine

## 2014-11-07 DIAGNOSIS — D631 Anemia in chronic kidney disease: Secondary | ICD-10-CM | POA: Insufficient documentation

## 2014-11-07 DIAGNOSIS — N184 Chronic kidney disease, stage 4 (severe): Secondary | ICD-10-CM | POA: Diagnosis present

## 2014-11-07 DIAGNOSIS — M79604 Pain in right leg: Secondary | ICD-10-CM

## 2014-11-07 DIAGNOSIS — Z79899 Other long term (current) drug therapy: Secondary | ICD-10-CM | POA: Insufficient documentation

## 2014-11-07 DIAGNOSIS — Z5181 Encounter for therapeutic drug level monitoring: Secondary | ICD-10-CM | POA: Diagnosis not present

## 2014-11-07 LAB — IRON AND TIBC
Iron: 80 ug/dL (ref 28–170)
SATURATION RATIOS: 34 % — AB (ref 10.4–31.8)
TIBC: 237 ug/dL — ABNORMAL LOW (ref 250–450)
UIBC: 157 ug/dL

## 2014-11-07 LAB — FERRITIN: Ferritin: 986 ng/mL — ABNORMAL HIGH (ref 11–307)

## 2014-11-07 LAB — POCT HEMOGLOBIN-HEMACUE: Hemoglobin: 9.4 g/dL — ABNORMAL LOW (ref 12.0–15.0)

## 2014-11-07 MED ORDER — DARBEPOETIN ALFA 150 MCG/0.3ML IJ SOSY
150.0000 ug | PREFILLED_SYRINGE | INTRAMUSCULAR | Status: DC
  Administered 2014-11-07: 150 ug via SUBCUTANEOUS

## 2014-11-07 MED ORDER — DARBEPOETIN ALFA 150 MCG/0.3ML IJ SOSY
PREFILLED_SYRINGE | INTRAMUSCULAR | Status: AC
  Filled 2014-11-07: qty 0.3

## 2014-12-05 ENCOUNTER — Encounter (HOSPITAL_COMMUNITY)
Admission: RE | Admit: 2014-12-05 | Discharge: 2014-12-05 | Disposition: A | Discharge status: Home / Self Care | Payer: BC Managed Care – PPO | Source: Ambulatory Visit | Attending: Nephrology | Admitting: Nephrology

## 2014-12-05 DIAGNOSIS — N184 Chronic kidney disease, stage 4 (severe): Secondary | ICD-10-CM | POA: Diagnosis present

## 2014-12-05 DIAGNOSIS — Z79899 Other long term (current) drug therapy: Secondary | ICD-10-CM | POA: Insufficient documentation

## 2014-12-05 DIAGNOSIS — Z5181 Encounter for therapeutic drug level monitoring: Secondary | ICD-10-CM | POA: Insufficient documentation

## 2014-12-05 DIAGNOSIS — D631 Anemia in chronic kidney disease: Secondary | ICD-10-CM | POA: Insufficient documentation

## 2014-12-05 LAB — POCT HEMOGLOBIN-HEMACUE: Hemoglobin: 9.5 g/dL — ABNORMAL LOW (ref 12.0–15.0)

## 2014-12-05 LAB — IRON AND TIBC
Iron: 63 ug/dL (ref 28–170)
Saturation Ratios: 27 % (ref 10.4–31.8)
TIBC: 231 ug/dL — AB (ref 250–450)
UIBC: 168 ug/dL

## 2014-12-05 LAB — FERRITIN: Ferritin: 851 ng/mL — ABNORMAL HIGH (ref 11–307)

## 2014-12-05 MED ORDER — DARBEPOETIN ALFA 150 MCG/0.3ML IJ SOSY
PREFILLED_SYRINGE | INTRAMUSCULAR | Status: AC
  Filled 2014-12-05: qty 0.3

## 2014-12-05 MED ORDER — DARBEPOETIN ALFA 150 MCG/0.3ML IJ SOSY
150.0000 ug | PREFILLED_SYRINGE | INTRAMUSCULAR | Status: DC
  Administered 2014-12-05: 150 ug via SUBCUTANEOUS

## 2014-12-18 ENCOUNTER — Inpatient Hospital Stay (HOSPITAL_COMMUNITY)
Admission: EM | Admit: 2014-12-18 | Discharge: 2014-12-22 | DRG: 871 | Disposition: A | Discharge status: Home / Self Care | Payer: Medicare Other | Attending: Internal Medicine | Admitting: Internal Medicine

## 2014-12-18 ENCOUNTER — Emergency Department (HOSPITAL_COMMUNITY): Payer: Medicare Other

## 2014-12-18 ENCOUNTER — Encounter (HOSPITAL_COMMUNITY): Payer: Self-pay | Admitting: Emergency Medicine

## 2014-12-18 DIAGNOSIS — G629 Polyneuropathy, unspecified: Secondary | ICD-10-CM | POA: Diagnosis present

## 2014-12-18 DIAGNOSIS — Z7982 Long term (current) use of aspirin: Secondary | ICD-10-CM | POA: Diagnosis not present

## 2014-12-18 DIAGNOSIS — I10 Essential (primary) hypertension: Secondary | ICD-10-CM | POA: Diagnosis not present

## 2014-12-18 DIAGNOSIS — E785 Hyperlipidemia, unspecified: Secondary | ICD-10-CM | POA: Diagnosis present

## 2014-12-18 DIAGNOSIS — D696 Thrombocytopenia, unspecified: Secondary | ICD-10-CM | POA: Diagnosis present

## 2014-12-18 DIAGNOSIS — G9341 Metabolic encephalopathy: Secondary | ICD-10-CM | POA: Diagnosis present

## 2014-12-18 DIAGNOSIS — Z6824 Body mass index (BMI) 24.0-24.9, adult: Secondary | ICD-10-CM | POA: Diagnosis not present

## 2014-12-18 DIAGNOSIS — K219 Gastro-esophageal reflux disease without esophagitis: Secondary | ICD-10-CM | POA: Diagnosis present

## 2014-12-18 DIAGNOSIS — E1151 Type 2 diabetes mellitus with diabetic peripheral angiopathy without gangrene: Secondary | ICD-10-CM | POA: Diagnosis present

## 2014-12-18 DIAGNOSIS — D631 Anemia in chronic kidney disease: Secondary | ICD-10-CM | POA: Diagnosis present

## 2014-12-18 DIAGNOSIS — G459 Transient cerebral ischemic attack, unspecified: Secondary | ICD-10-CM | POA: Diagnosis present

## 2014-12-18 DIAGNOSIS — G9340 Encephalopathy, unspecified: Secondary | ICD-10-CM | POA: Diagnosis present

## 2014-12-18 DIAGNOSIS — E1122 Type 2 diabetes mellitus with diabetic chronic kidney disease: Secondary | ICD-10-CM | POA: Diagnosis present

## 2014-12-18 DIAGNOSIS — E119 Type 2 diabetes mellitus without complications: Secondary | ICD-10-CM | POA: Diagnosis not present

## 2014-12-18 DIAGNOSIS — N183 Chronic kidney disease, stage 3 (moderate): Secondary | ICD-10-CM | POA: Diagnosis present

## 2014-12-18 DIAGNOSIS — B9620 Unspecified Escherichia coli [E. coli] as the cause of diseases classified elsewhere: Secondary | ICD-10-CM | POA: Diagnosis present

## 2014-12-18 DIAGNOSIS — Z881 Allergy status to other antibiotic agents status: Secondary | ICD-10-CM | POA: Diagnosis not present

## 2014-12-18 DIAGNOSIS — R0602 Shortness of breath: Secondary | ICD-10-CM

## 2014-12-18 DIAGNOSIS — E43 Unspecified severe protein-calorie malnutrition: Secondary | ICD-10-CM | POA: Diagnosis present

## 2014-12-18 DIAGNOSIS — N390 Urinary tract infection, site not specified: Secondary | ICD-10-CM | POA: Diagnosis present

## 2014-12-18 DIAGNOSIS — F22 Delusional disorders: Secondary | ICD-10-CM

## 2014-12-18 DIAGNOSIS — Z940 Kidney transplant status: Secondary | ICD-10-CM | POA: Diagnosis not present

## 2014-12-18 DIAGNOSIS — Z885 Allergy status to narcotic agent status: Secondary | ICD-10-CM | POA: Diagnosis not present

## 2014-12-18 DIAGNOSIS — Z794 Long term (current) use of insulin: Secondary | ICD-10-CM | POA: Diagnosis not present

## 2014-12-18 DIAGNOSIS — M109 Gout, unspecified: Secondary | ICD-10-CM | POA: Diagnosis present

## 2014-12-18 DIAGNOSIS — N179 Acute kidney failure, unspecified: Secondary | ICD-10-CM | POA: Diagnosis present

## 2014-12-18 DIAGNOSIS — E039 Hypothyroidism, unspecified: Secondary | ICD-10-CM | POA: Diagnosis present

## 2014-12-18 DIAGNOSIS — R509 Fever, unspecified: Secondary | ICD-10-CM | POA: Diagnosis present

## 2014-12-18 DIAGNOSIS — I129 Hypertensive chronic kidney disease with stage 1 through stage 4 chronic kidney disease, or unspecified chronic kidney disease: Secondary | ICD-10-CM | POA: Diagnosis present

## 2014-12-18 DIAGNOSIS — E876 Hypokalemia: Secondary | ICD-10-CM | POA: Diagnosis present

## 2014-12-18 DIAGNOSIS — A419 Sepsis, unspecified organism: Principal | ICD-10-CM | POA: Diagnosis present

## 2014-12-18 DIAGNOSIS — E860 Dehydration: Secondary | ICD-10-CM | POA: Diagnosis present

## 2014-12-18 DIAGNOSIS — N184 Chronic kidney disease, stage 4 (severe): Secondary | ICD-10-CM | POA: Diagnosis not present

## 2014-12-18 DIAGNOSIS — D649 Anemia, unspecified: Secondary | ICD-10-CM | POA: Diagnosis present

## 2014-12-18 MED ORDER — DEXTROSE 5 % IV SOLN
2.0000 g | Freq: Once | INTRAVENOUS | Status: AC
  Administered 2014-12-19: 2 g via INTRAVENOUS
  Filled 2014-12-18: qty 2

## 2014-12-18 MED ORDER — ACETAMINOPHEN 325 MG PO TABS: 650.0000 mg | ORAL_TABLET | Freq: Once | ORAL | Status: AC | PRN

## 2014-12-18 MED ORDER — LEVOFLOXACIN IN D5W 750 MG/150ML IV SOLN
750.0000 mg | Freq: Once | INTRAVENOUS | Status: AC
  Administered 2014-12-18: 750 mg via INTRAVENOUS
  Filled 2014-12-18: qty 150

## 2014-12-18 MED ORDER — VANCOMYCIN HCL IN DEXTROSE 1-5 GM/200ML-% IV SOLN
1000.0000 mg | Freq: Once | INTRAVENOUS | Status: AC
  Administered 2014-12-18: 1000 mg via INTRAVENOUS
  Filled 2014-12-18: qty 200

## 2014-12-18 NOTE — ED Notes (Signed)
pts lab results given to Dr. Freida BusmanAllen.

## 2014-12-18 NOTE — ED Notes (Signed)
Radiology at bedside

## 2014-12-18 NOTE — ED Notes (Signed)
Family at bedside. 

## 2014-12-18 NOTE — ED Notes (Addendum)
MD at bedside. Advised this RN that he does not need a rectal temp to be done at this time.

## 2014-12-18 NOTE — ED Notes (Addendum)
Pt arrives via [facility] for call about of ams and possible fever by patient's family. Pt has had kidney transplant, ems reported family did not state how long ago surgery occurred. Pt oriented to person and place, disoriented to time and situation. ns given by ems.

## 2014-12-18 NOTE — H&P (Signed)
[facility] History and Physical  April Casey WJX:[id number] DOB: 05-02-1932 DOA: 12/18/2014  Referring physician: ED physician PCP: Cala Bradford, MD  Specialists:   Chief Complaint: Fever, increased urinary frequency, confusion  HPI: April Casey is a 81 y.o. female with PMH of hypertension, hyperlipidemia, diabetes mellitus, GERD, hypothyroidism, gout, kidney transportation (on prednisone and tacrolimus), TIA, PVD, chronic kidney disease-stage III, who presents with fever, increased urinary frequency and confusion.  Patient has some mild confusion, but still could tell some of the medical history. History is also obtained by talking to her daughters. Patient were noted to be mildly confused today, has decreased oral intake, does not interact with others as normally she does. Patient states that she does not feel good. She states that she has increased urinary frequency, but no dysuria, burning on urination. She has mild dry cough, but no shortness of breath or chest pain. Patient does not have headache, neck pain, photophobia, abdominal pain, diarrhea, unilateral weakness, numbness or tingling sensations.  In ED, patient was found to have WBC 10.5, temperature 100.9, tachycardia, worsening renal function, negative chest x-ray, pending urinalysis. She is admitted to inpatient for further evaluation and treatment.  Where does patient live?   At home   Can patient participate in ADLs?   Little  Review of Systems:   General: has fevers, chills, no changes in body weight, has poor appetite, has fatigue HEENT: no blurry vision, hearing changes or sore throat Pulm: no dyspnea, has mild coughing, no wheezing CV: no chest pain, palpitations Abd: no nausea, vomiting, abdominal pain, diarrhea, constipation GU: no dysuria, burning on urination, has increased urinary frequency, no hematuria  Ext: no leg edema Neuro: no unilateral weakness, numbness, or tingling, no vision change or  hearing loss Skin: no rash MSK: No muscle spasm, no deformity, no limitation of range of movement in spin Heme: No easy bruising.  Travel history: No recent long distant travel.  Allergy:  Allergies  Allergen Reactions  . Amoxicillin     NAUSEATED  . Codeine     VOMITTING    Past Medical History  Diagnosis Date  . Gout   . CKD (chronic kidney disease)   . Diabetes   . TIA (transient ischemic attack)   . Dyslipidemia   . Anemia   . HTN (hypertension)   . PVD (peripheral vascular disease)   . Orthostatic hypotension 06/12/2014    Past Surgical History  Procedure Laterality Date  . Breast biopsy    . Tubal ligation    . Kidney transplant    . Av shunt for dialyisis      Social History:  reports that she has never smoked. She has never used smokeless tobacco. She reports that she does not drink alcohol or use illicit drugs.  Family History:  Family History  Problem Relation Age of Onset  . Hypertension Father   . Stroke Father   . Hypertension Mother   . Stroke Mother   . Diabetes Brother   . Heart attack Brother   . Heart failure Sister   . Diabetes Sister   . Heart attack Brother   . Cancer Brother      Prior to Admission medications   Medication Sig Start Date End Date Taking? Authorizing Provider  acetaminophen (TYLENOL) 325 MG tablet Take 650 mg by mouth every 6 (six) hours as needed (pain).   Yes Historical Provider, MD  allopurinol (ZYLOPRIM) 100 MG tablet Take 100 mg by mouth daily.   Yes  Historical Provider, MD  aspirin 81 MG tablet Take 81 mg by mouth daily.   Yes Historical Provider, MD  calcitRIOL (ROCALTROL) 0.25 MCG capsule Take 0.25 mcg by mouth daily.   Yes Historical Provider, MD  gabapentin (NEURONTIN) 100 MG capsule Take 1 capsule (100 mg total) by mouth 3 (three) times daily. 11/02/14  Yes April BennettEzequiel Zamora, MD  insulin glargine (LANTUS) 100 UNIT/ML injection Inject 0.15 mLs (15 Units total) into the skin at bedtime. 11/02/14  Yes April BennettEzequiel Zamora,  MD  labetalol (NORMODYNE) 200 MG tablet Take 200 mg by mouth 2 (two) times daily.  04/03/14  Yes Historical Provider, MD  levothyroxine (SYNTHROID, LEVOTHROID) 25 MCG tablet Take 25 mcg by mouth daily before breakfast.   Yes Historical Provider, MD  magnesium oxide (MAG-OX) 400 MG tablet Take 400 mg by mouth daily.   Yes Historical Provider, MD  omeprazole (PRILOSEC) 20 MG capsule Take 20 mg by mouth daily.   Yes Historical Provider, MD  predniSONE (DELTASONE) 5 MG tablet Take 5 mg by mouth daily with breakfast.   Yes Historical Provider, MD  rosuvastatin (CRESTOR) 20 MG tablet Take 20 mg by mouth daily.   Yes Historical Provider, MD  tacrolimus (PROGRAF) 1 MG capsule Take 1 mg by mouth 2 (two) times daily.   Yes Historical Provider, MD    Physical Exam: Filed Vitals:   12/18/14 2330 12/18/14 2345 12/18/14 2350 12/19/14 0000  BP: 165/55 178/51  172/48  Pulse: 93 93  102  Temp:      TempSrc:      Resp: 20   14  Weight:   65.772 kg (145 lb)   SpO2: 95% 96%  100%   General: Not in acute distress HEENT:       Eyes: PERRL, EOMI, no scleral icterus.       ENT: No discharge from the ears and nose, no pharynx injection, no tonsillar enlargement.        Neck: No JVD, no bruit, no mass felt. Heme: No neck lymph node enlargement. Cardiac: S1/S2, RRR, No murmurs, No gallops or rubs. Pulm:  No rales, wheezing, rhonchi or rubs. Abd: Soft, nondistended, nontender, no rebound pain, no organomegaly, BS present. No CVA tenderness Ext: No pitting leg edema bilaterally. 2+DP/PT pulse bilaterally. Has functioning AVF in left arm. Musculoskeletal: No joint deformities, No joint redness or warmth, no limitation of ROM in spin. Skin: No rashes.  Neuro: Alert, oriented to person and place, but not time, cranial nerves II-XII grossly intact, muscle strength 5/5 in all extremities, sensation to light touch intact. Brachial reflex 1+ bilaterally. Knee reflex 1+ bilaterally. Negative Babinski's sign.  Psych:  Patient is not psychotic, no suicidal or hemocidal ideation.  Labs on Admission:  Basic Metabolic Panel: No results for input(s): NA, K, CL, CO2, GLUCOSE, BUN, CREATININE, CALCIUM, MG, PHOS in the last 168 hours. Liver Function Tests: No results for input(s): AST, ALT, ALKPHOS, BILITOT, PROT, ALBUMIN in the last 168 hours. No results for input(s): LIPASE, AMYLASE in the last 168 hours. No results for input(s): AMMONIA in the last 168 hours. CBC: No results for input(s): WBC, NEUTROABS, HGB, HCT, MCV, PLT in the last 168 hours. Cardiac Enzymes: No results for input(s): CKTOTAL, CKMB, CKMBINDEX, TROPONINI in the last 168 hours.  BNP (last 3 results) No results for input(s): BNP in the last 8760 hours.  ProBNP (last 3 results) No results for input(s): PROBNP in the last 8760 hours.  CBG: No results for input(s): GLUCAP in the last  168 hours.  Radiological Exams on Admission: Dg Chest Port 1 View  12/18/2014   CLINICAL DATA:  81 year old female with shortness of breath  EXAM: PORTABLE CHEST - 1 VIEW  COMPARISON:  Radiograph dated 01/31/2011  FINDINGS: The heart size and mediastinal contours are within normal limits. Both lungs are clear. The visualized skeletal structures are unremarkable.  IMPRESSION: No active disease.   Electronically Signed   By: Elgie Collard M.D.   On: 12/18/2014 23:03    EKG: Independently reviewed.  Abnormal findings: T-wave inversion only in aVL, no ischemic change.   Assessment/Plan Principal Problem:   Fever Active Problems:   Gout   Acute renal failure superimposed on stage 3 chronic kidney disease   Diabetes   TIA (transient ischemic attack)   Dyslipidemia   Anemia   HTN (hypertension)   Acute encephalopathy  Fever: Etiology is not clear, but may be due to urinary tract infection given increased urinary frequency. Urinalysis is pending. Patient does not meet criteria for sepsis. Hemodynamically stable. Given her immunosuppression condition,  EDstarted antibodies with broader coverage. -  Admit to telemetry - Continue IV vancomycin, aztreonam and Levaquin which were started in emergency room - Follow up results of UA, urine and blood cx and amend antibiotic regimen if needed per sensitivity results - prn Zofran for nausea --will get Procalcitonin and trend lactic acid levels  -IVF: 500 cc of NS bolus, followed by 755 cc/h.  Acute encephalopathy: Likely secondary to infection, possibly UTI -On antibodies with broader coverage as above -Neuro check frequently  Acute renal failure superimposed on stage 3 chronic kidney disease: Likely due to prerenal secondary to dehydration. - IVF as above - Check FeNa - US-renal - Follow up renal function by BMP  Hx of kidney transplantation: Now has CKD-III. has functional AV fistula, not started dialysis yet. On prednisone 5 mg daily and tacrolimus. -Continue home medications -Give stress dose Solu-Cortef 50 milligrams 1 -Check tacrolimus level -Check cortisol level  Gout: stable. -Continue allopurinol  DM-II: Last A1c 6.5 on 10/31/14, well controled. Patient is taking Lantus at home -will decrease Lantus dose from 15-10 units daily -SSI  TIA (transient ischemic attack): -Aspirin  HLD: Last LDL was 31 on 10/24/09 -Continue home medications: Crestor  HTN (hypertension): -Labetalol   DVT ppx: SQ Heparin    Code Status: Full code Family Communication:  Yes, patient's 2 daughters at bed side Disposition Plan: Admit to inpatient   Date of Service 12/19/2014    Lorretta Harp [facility] Pager [phone]  If 7PM-7AM, please contact night-coverage [email or web address] Password [facility] 12/19/2014, 12:43 AM

## 2014-12-18 NOTE — ED Provider Notes (Signed)
CSN: [id number]     Arrival date & time 12/18/14  2208 History   First MD Initiated Contact with Patient 12/18/14 2228     Chief Complaint  Patient presents with  . Fever  . Altered Mental Status     (Consider location/radiation/quality/duration/timing/severity/associated sxs/prior Treatment) HPI Comments: Patient here with fever and altered mental status 1 day. No reported headache, photophobia, neck pain. No abdominal pain or chest pain. No dysuria or hematuria. No rashes noted. Patient is status post renal transplant several years ago but is not pre-dialysis due to the kidney failing. No reported cough or congestion. EMS was called and patient given saline 250 mL and improved. No other treatment used prior to arrival.  Patient is a 81 y.o. female presenting with fever and altered mental status. The history is provided by the patient and a relative.  Fever Altered Mental Status Associated symptoms: fever     Past Medical History  Diagnosis Date  . Gout   . CKD (chronic kidney disease)   . Diabetes   . TIA (transient ischemic attack)   . Dyslipidemia   . Anemia   . HTN (hypertension)   . PVD (peripheral vascular disease)   . Orthostatic hypotension 06/12/2014   Past Surgical History  Procedure Laterality Date  . Breast biopsy    . Tubal ligation    . Kidney transplant    . Av shunt for dialyisis     Family History  Problem Relation Age of Onset  . Hypertension Father   . Stroke Father   . Hypertension Mother   . Stroke Mother   . Diabetes Brother   . Heart attack Brother   . Heart failure Sister   . Diabetes Sister   . Heart attack Brother   . Cancer Brother    History  Substance Use Topics  . Smoking status: Never Smoker   . Smokeless tobacco: Never Used  . Alcohol Use: No   OB History    No data available     Review of Systems  Constitutional: Positive for fever.  All other systems reviewed and are negative.     Allergies  Amoxicillin and  Codeine  Home Medications   Prior to Admission medications   Medication Sig Start Date End Date Taking? Authorizing Provider  acetaminophen (TYLENOL) 325 MG tablet Take 650 mg by mouth every 6 (six) hours as needed (pain).    Historical Provider, MD  allopurinol (ZYLOPRIM) 100 MG tablet Take 100 mg by mouth daily.    Historical Provider, MD  aspirin 81 MG tablet Take 81 mg by mouth daily.    Historical Provider, MD  calcitRIOL (ROCALTROL) 0.25 MCG capsule Take 0.25 mcg by mouth daily.    Historical Provider, MD  gabapentin (NEURONTIN) 100 MG capsule Take 1 capsule (100 mg total) by mouth 3 (three) times daily. 11/02/14   Jeralyn BennettEzequiel Zamora, MD  insulin glargine (LANTUS) 100 UNIT/ML injection Inject 0.15 mLs (15 Units total) into the skin at bedtime. 11/02/14   Jeralyn BennettEzequiel Zamora, MD  labetalol (NORMODYNE) 200 MG tablet Take 200 mg by mouth every evening.  04/03/14   Historical Provider, MD  labetalol (NORMODYNE) 300 MG tablet Take 300 mg by mouth daily with breakfast.  04/03/14   Historical Provider, MD  levothyroxine (SYNTHROID, LEVOTHROID) 25 MCG tablet Take 25 mcg by mouth daily before breakfast.    Historical Provider, MD  magnesium oxide (MAG-OX) 400 MG tablet Take 400 mg by mouth daily.    Historical Provider, MD  meclizine (ANTIVERT) 25 MG tablet Take 25 mg by mouth 3 (three) times daily as needed for dizziness.    Historical Provider, MD  mycophenolate (MYFORTIC) 180 MG EC tablet Take 180 mg by mouth daily.     Historical Provider, MD  omeprazole (PRILOSEC) 20 MG capsule Take 20 mg by mouth daily.    Historical Provider, MD  predniSONE (DELTASONE) 5 MG tablet Take 5 mg by mouth daily with breakfast.    Historical Provider, MD  rosuvastatin (CRESTOR) 20 MG tablet Take 20 mg by mouth daily.    Historical Provider, MD  tacrolimus (PROGRAF) 1 MG capsule Take 1 mg by mouth 2 (two) times daily.    Historical Provider, MD   BP 160/53 mmHg  Pulse 95  Temp(Src) 100.9 F (38.3 C) (Oral)  Resp 20   SpO2 97% Physical Exam  Constitutional: She is oriented to person, place, and time. She appears well-developed and well-nourished.  Non-toxic appearance. No distress.  HENT:  Head: Normocephalic and atraumatic.  Eyes: Conjunctivae, EOM and lids are normal. Pupils are equal, round, and reactive to light.  Neck: Normal range of motion. Neck supple. No tracheal deviation present. No thyroid mass present.  Cardiovascular: Normal rate, regular rhythm and normal heart sounds.  Exam reveals no gallop.   No murmur heard. Pulmonary/Chest: Effort normal and breath sounds normal. No stridor. No respiratory distress. She has no decreased breath sounds. She has no wheezes. She has no rhonchi. She has no rales.  Abdominal: Soft. Normal appearance and bowel sounds are normal. She exhibits no distension. There is no tenderness. There is no rebound and no CVA tenderness.  Musculoskeletal: Normal range of motion. She exhibits no edema or tenderness.  Neurological: She is alert and oriented to person, place, and time. She has normal strength. No cranial nerve deficit or sensory deficit. GCS eye subscore is 4. GCS verbal subscore is 5. GCS motor subscore is 6.  Skin: Skin is warm and dry. No abrasion and no rash noted.  Psychiatric: She has a normal mood and affect. Her speech is normal and behavior is normal.  Nursing note and vitals reviewed.   ED Course  Procedures (including critical care time) Labs Review Labs Reviewed  CULTURE, BLOOD (ROUTINE X 2)  CULTURE, BLOOD (ROUTINE X 2)  URINE CULTURE  COMPREHENSIVE METABOLIC PANEL  URINALYSIS, ROUTINE W REFLEX MICROSCOPIC (NOT AT [facility])  CBC WITH DIFFERENTIAL/PLATELET  I-STAT CG4 LACTIC ACID, ED    Imaging Review No results found.   EKG Interpretation None      MDM   Final diagnoses:  SOB (shortness of breath)   Patient started on IV antibiotics and will be admitted to the hospitalist    Lorre Nick, MD 12/18/14 2356

## 2014-12-19 ENCOUNTER — Inpatient Hospital Stay (HOSPITAL_COMMUNITY): Payer: Medicare Other

## 2014-12-19 DIAGNOSIS — E43 Unspecified severe protein-calorie malnutrition: Secondary | ICD-10-CM | POA: Insufficient documentation

## 2014-12-19 DIAGNOSIS — E119 Type 2 diabetes mellitus without complications: Secondary | ICD-10-CM | POA: Insufficient documentation

## 2014-12-19 DIAGNOSIS — N390 Urinary tract infection, site not specified: Secondary | ICD-10-CM

## 2014-12-19 DIAGNOSIS — G9340 Encephalopathy, unspecified: Secondary | ICD-10-CM | POA: Diagnosis present

## 2014-12-19 DIAGNOSIS — A419 Sepsis, unspecified organism: Principal | ICD-10-CM

## 2014-12-19 LAB — URINALYSIS, ROUTINE W REFLEX MICROSCOPIC
Bilirubin Urine: NEGATIVE
GLUCOSE, UA: NEGATIVE mg/dL
KETONES UR: 15 mg/dL — AB
NITRITE: POSITIVE — AB
PROTEIN: 100 mg/dL — AB
Specific Gravity, Urine: 1.011 (ref 1.005–1.030)
UROBILINOGEN UA: 0.2 mg/dL (ref 0.0–1.0)
pH: 5.5 (ref 5.0–8.0)

## 2014-12-19 LAB — COMPREHENSIVE METABOLIC PANEL
ALT: 13 U/L — ABNORMAL LOW (ref 14–54)
AST: 24 U/L (ref 15–41)
Albumin: 3.8 g/dL (ref 3.5–5.0)
Alkaline Phosphatase: 54 U/L (ref 38–126)
Anion gap: 16 — ABNORMAL HIGH (ref 5–15)
BUN: 59 mg/dL — ABNORMAL HIGH (ref 6–20)
CALCIUM: 10.5 mg/dL — AB (ref 8.9–10.3)
CO2: 24 mmol/L (ref 22–32)
CREATININE: 3.95 mg/dL — AB (ref 0.44–1.00)
Chloride: 102 mmol/L (ref 101–111)
GFR calc non Af Amer: 10 mL/min — ABNORMAL LOW (ref >60)
GFR, EST AFRICAN AMERICAN: 11 mL/min — AB (ref >60)
GLUCOSE: 111 mg/dL — AB (ref 65–99)
Potassium: 3.8 mmol/L (ref 3.5–5.1)
Sodium: 142 mmol/L (ref 135–145)
Total Bilirubin: 1.2 mg/dL (ref 0.3–1.2)
Total Protein: 7.1 g/dL (ref 6.5–8.1)

## 2014-12-19 LAB — CBC WITH DIFFERENTIAL/PLATELET
BASOS PCT: 0 % (ref 0–1)
Basophils Absolute: 0 10*3/uL (ref 0.0–0.1)
EOS PCT: 0 % (ref 0–5)
Eosinophils Absolute: 0 10*3/uL (ref 0.0–0.7)
HCT: 38.8 % (ref 36.0–46.0)
HEMOGLOBIN: 12 g/dL (ref 12.0–15.0)
Lymphocytes Relative: 6 % — ABNORMAL LOW (ref 12–46)
Lymphs Abs: 0.6 10*3/uL — ABNORMAL LOW (ref 0.7–4.0)
MCH: 27.7 pg (ref 26.0–34.0)
MCHC: 30.9 g/dL (ref 30.0–36.0)
MCV: 89.6 fL (ref 78.0–100.0)
Monocytes Absolute: 0.6 10*3/uL (ref 0.1–1.0)
Monocytes Relative: 6 % (ref 3–12)
Neutro Abs: 9.2 10*3/uL — ABNORMAL HIGH (ref 1.7–7.7)
Neutrophils Relative %: 88 % — ABNORMAL HIGH (ref 43–77)
PLATELETS: 182 10*3/uL (ref 150–400)
RBC: 4.33 MIL/uL (ref 3.87–5.11)
RDW: 14.9 % (ref 11.5–15.5)
WBC: 10.5 10*3/uL (ref 4.0–10.5)

## 2014-12-19 LAB — PROTIME-INR
INR: 1.43 (ref 0.00–1.49)
Prothrombin Time: 17.5 seconds — ABNORMAL HIGH (ref 11.6–15.2)

## 2014-12-19 LAB — CBC
HCT: 33.4 % — ABNORMAL LOW (ref 36.0–46.0)
Hemoglobin: 10.2 g/dL — ABNORMAL LOW (ref 12.0–15.0)
MCH: 27.3 pg (ref 26.0–34.0)
MCHC: 30.5 g/dL (ref 30.0–36.0)
MCV: 89.3 fL (ref 78.0–100.0)
Platelets: 167 10*3/uL (ref 150–400)
RBC: 3.74 MIL/uL — AB (ref 3.87–5.11)
RDW: 15.1 % (ref 11.5–15.5)
WBC: 11.2 10*3/uL — ABNORMAL HIGH (ref 4.0–10.5)

## 2014-12-19 LAB — BASIC METABOLIC PANEL
Anion gap: 12 (ref 5–15)
BUN: 60 mg/dL — ABNORMAL HIGH (ref 6–20)
CO2: 23 mmol/L (ref 22–32)
CREATININE: 3.93 mg/dL — AB (ref 0.44–1.00)
Calcium: 9.5 mg/dL (ref 8.9–10.3)
Chloride: 105 mmol/L (ref 101–111)
GFR calc non Af Amer: 10 mL/min — ABNORMAL LOW (ref >60)
GFR, EST AFRICAN AMERICAN: 11 mL/min — AB (ref >60)
Glucose, Bld: 234 mg/dL — ABNORMAL HIGH (ref 65–99)
Potassium: 3.8 mmol/L (ref 3.5–5.1)
SODIUM: 140 mmol/L (ref 135–145)

## 2014-12-19 LAB — URINE MICROSCOPIC-ADD ON

## 2014-12-19 LAB — PROCALCITONIN: Procalcitonin: 10.53 ng/mL

## 2014-12-19 LAB — GLUCOSE, CAPILLARY
GLUCOSE-CAPILLARY: 355 mg/dL — AB (ref 65–99)
Glucose-Capillary: 180 mg/dL — ABNORMAL HIGH (ref 65–99)
Glucose-Capillary: 202 mg/dL — ABNORMAL HIGH (ref 65–99)
Glucose-Capillary: 247 mg/dL — ABNORMAL HIGH (ref 65–99)
Glucose-Capillary: 261 mg/dL — ABNORMAL HIGH (ref 65–99)

## 2014-12-19 LAB — LACTIC ACID, PLASMA: Lactic Acid, Venous: 1.5 mmol/L (ref 0.5–2.0)

## 2014-12-19 LAB — APTT: APTT: 37 s (ref 24–37)

## 2014-12-19 LAB — CORTISOL-AM, BLOOD: CORTISOL - AM: 40.8 ug/dL — AB (ref 6.7–22.6)

## 2014-12-19 MED ORDER — GLUCERNA SHAKE PO LIQD
237.0000 mL | Freq: Three times a day (TID) | ORAL | Status: DC
  Administered 2014-12-19 – 2014-12-21 (×4): 237 mL via ORAL

## 2014-12-19 MED ORDER — LEVOFLOXACIN IN D5W 500 MG/100ML IV SOLN: 500.0000 mg | INTRAVENOUS | Status: DC

## 2014-12-19 MED ORDER — CALCITRIOL 0.25 MCG PO CAPS
0.2500 ug | ORAL_CAPSULE | Freq: Every day | ORAL | Status: DC
  Administered 2014-12-19 – 2014-12-22 (×4): 0.25 ug via ORAL
  Filled 2014-12-19 (×5): qty 1

## 2014-12-19 MED ORDER — ONDANSETRON HCL 4 MG PO TABS: 4.0000 mg | ORAL_TABLET | Freq: Four times a day (QID) | ORAL | Status: DC | PRN

## 2014-12-19 MED ORDER — LEVOFLOXACIN IN D5W 500 MG/100ML IV SOLN
500.0000 mg | INTRAVENOUS | Status: DC
  Administered 2014-12-20: 500 mg via INTRAVENOUS
  Filled 2014-12-19: qty 100

## 2014-12-19 MED ORDER — PIPERACILLIN-TAZOBACTAM IN DEX 2-0.25 GM/50ML IV SOLN
2.2500 g | Freq: Three times a day (TID) | INTRAVENOUS | Status: DC
  Administered 2014-12-19 – 2014-12-22 (×9): 2.25 g via INTRAVENOUS
  Filled 2014-12-19 (×11): qty 50

## 2014-12-19 MED ORDER — AZTREONAM 1 G IJ SOLR
500.0000 mg | Freq: Three times a day (TID) | INTRAMUSCULAR | Status: DC
  Filled 2014-12-19 (×2): qty 0.5

## 2014-12-19 MED ORDER — DEXTROSE 5 % IV SOLN: 1.0000 g | Freq: Two times a day (BID) | INTRAVENOUS | Status: DC

## 2014-12-19 MED ORDER — ONDANSETRON HCL 4 MG/2ML IJ SOLN: 4.0000 mg | Freq: Four times a day (QID) | INTRAMUSCULAR | Status: DC | PRN

## 2014-12-19 MED ORDER — INSULIN GLARGINE 100 UNIT/ML ~~LOC~~ SOLN
10.0000 [IU] | Freq: Every day | SUBCUTANEOUS | Status: DC
  Administered 2014-12-19 – 2014-12-21 (×4): 10 [IU] via SUBCUTANEOUS
  Filled 2014-12-19 (×5): qty 0.1

## 2014-12-19 MED ORDER — SODIUM CHLORIDE 0.9 % IV BOLUS (SEPSIS)
500.0000 mL | Freq: Once | INTRAVENOUS | Status: AC
  Administered 2014-12-19: 500 mL via INTRAVENOUS

## 2014-12-19 MED ORDER — INSULIN ASPART 100 UNIT/ML ~~LOC~~ SOLN
0.0000 [IU] | Freq: Three times a day (TID) | SUBCUTANEOUS | Status: DC
  Administered 2014-12-19: 3 [IU] via SUBCUTANEOUS
  Administered 2014-12-19: 9 [IU] via SUBCUTANEOUS
  Administered 2014-12-19: 3 [IU] via SUBCUTANEOUS
  Administered 2014-12-20: 1 [IU] via SUBCUTANEOUS
  Administered 2014-12-20: 3 [IU] via SUBCUTANEOUS
  Administered 2014-12-21: 2 [IU] via SUBCUTANEOUS

## 2014-12-19 MED ORDER — ALUM & MAG HYDROXIDE-SIMETH 200-200-20 MG/5ML PO SUSP: 30.0000 mL | Freq: Four times a day (QID) | ORAL | Status: DC | PRN

## 2014-12-19 MED ORDER — ROSUVASTATIN CALCIUM 20 MG PO TABS
20.0000 mg | ORAL_TABLET | Freq: Every day | ORAL | Status: DC
  Administered 2014-12-19 – 2014-12-22 (×4): 20 mg via ORAL
  Filled 2014-12-19 (×5): qty 1

## 2014-12-19 MED ORDER — LEVOTHYROXINE SODIUM 25 MCG PO TABS
25.0000 ug | ORAL_TABLET | Freq: Every day | ORAL | Status: DC
Start: 2014-12-19 — End: 2014-12-22
  Administered 2014-12-19 – 2014-12-22 (×4): 25 ug via ORAL
  Filled 2014-12-19 (×6): qty 1

## 2014-12-19 MED ORDER — SODIUM CHLORIDE 0.9 % IV SOLN
INTRAVENOUS | Status: DC
  Administered 2014-12-19 – 2014-12-21 (×4): via INTRAVENOUS

## 2014-12-19 MED ORDER — MAGNESIUM OXIDE 400 (241.3 MG) MG PO TABS
400.0000 mg | ORAL_TABLET | Freq: Every day | ORAL | Status: DC
  Administered 2014-12-19 – 2014-12-22 (×4): 400 mg via ORAL
  Filled 2014-12-19 (×4): qty 1

## 2014-12-19 MED ORDER — PANTOPRAZOLE SODIUM 40 MG PO TBEC
40.0000 mg | DELAYED_RELEASE_TABLET | Freq: Every day | ORAL | Status: DC
  Administered 2014-12-19 – 2014-12-22 (×4): 40 mg via ORAL
  Filled 2014-12-19: qty 1
  Filled 2014-12-19: qty 2
  Filled 2014-12-19 (×2): qty 1

## 2014-12-19 MED ORDER — ACETAMINOPHEN 325 MG PO TABS: 650.0000 mg | ORAL_TABLET | Freq: Four times a day (QID) | ORAL | Status: DC | PRN

## 2014-12-19 MED ORDER — ALLOPURINOL 100 MG PO TABS
100.0000 mg | ORAL_TABLET | Freq: Every day | ORAL | Status: DC
  Administered 2014-12-19 – 2014-12-22 (×4): 100 mg via ORAL
  Filled 2014-12-19 (×5): qty 1

## 2014-12-19 MED ORDER — HYDROCORTISONE NA SUCCINATE PF 100 MG IJ SOLR
50.0000 mg | Freq: Once | INTRAMUSCULAR | Status: AC
  Administered 2014-12-19: 50 mg via INTRAVENOUS
  Filled 2014-12-19: qty 2

## 2014-12-19 MED ORDER — VANCOMYCIN HCL IN DEXTROSE 750-5 MG/150ML-% IV SOLN: 750.0000 mg | Freq: Two times a day (BID) | INTRAVENOUS | Status: DC

## 2014-12-19 MED ORDER — PREDNISONE 5 MG PO TABS
5.0000 mg | ORAL_TABLET | Freq: Every day | ORAL | Status: DC
  Administered 2014-12-19 – 2014-12-22 (×4): 5 mg via ORAL
  Filled 2014-12-19 (×5): qty 1

## 2014-12-19 MED ORDER — VANCOMYCIN HCL IN DEXTROSE 750-5 MG/150ML-% IV SOLN: 750.0000 mg | INTRAVENOUS | Status: DC

## 2014-12-19 MED ORDER — ASPIRIN 81 MG PO CHEW
81.0000 mg | CHEWABLE_TABLET | Freq: Every day | ORAL | Status: DC
  Administered 2014-12-19 – 2014-12-22 (×4): 81 mg via ORAL
  Filled 2014-12-19 (×4): qty 1

## 2014-12-19 MED ORDER — SODIUM CHLORIDE 0.9 % IJ SOLN
3.0000 mL | Freq: Two times a day (BID) | INTRAMUSCULAR | Status: DC
  Administered 2014-12-19 – 2014-12-21 (×4): 3 mL via INTRAVENOUS

## 2014-12-19 MED ORDER — HEPARIN SODIUM (PORCINE) 5000 UNIT/ML IJ SOLN
5000.0000 [IU] | Freq: Three times a day (TID) | INTRAMUSCULAR | Status: DC
  Administered 2014-12-19 – 2014-12-22 (×10): 5000 [IU] via SUBCUTANEOUS
  Filled 2014-12-19 (×13): qty 1

## 2014-12-19 MED ORDER — GABAPENTIN 100 MG PO CAPS
100.0000 mg | ORAL_CAPSULE | Freq: Three times a day (TID) | ORAL | Status: DC
Start: 2014-12-19 — End: 2014-12-22
  Administered 2014-12-19 – 2014-12-22 (×10): 100 mg via ORAL
  Filled 2014-12-19 (×14): qty 1

## 2014-12-19 MED ORDER — LABETALOL HCL 200 MG PO TABS
200.0000 mg | ORAL_TABLET | Freq: Two times a day (BID) | ORAL | Status: DC
  Administered 2014-12-19 – 2014-12-22 (×6): 200 mg via ORAL
  Filled 2014-12-19 (×10): qty 1

## 2014-12-19 MED ORDER — TACROLIMUS 1 MG PO CAPS
1.0000 mg | ORAL_CAPSULE | Freq: Two times a day (BID) | ORAL | Status: DC
  Administered 2014-12-19 – 2014-12-22 (×7): 1 mg via ORAL
  Filled 2014-12-19 (×10): qty 1

## 2014-12-19 NOTE — Progress Notes (Signed)
Admission note:  Arrival Method: Pt arrived on stretcher from ED with nurse tech Mental Orientation: Alert and oriented x 4. Occasional confusion  Telemetry: Telemetry box 6e14 applied. CCMD notified Assessment: Completed, see doc flowsheets Skin: Dry and intact IV: Right hand and right forearm IV. NS @75 , site clean, dry and intact Pain: Pt states no pain at this time Tubes: Left arm fistula. Thrill and bruit present Safety Measures: Bed in lowest position, non-slip socks placed, call light within reach Fall Prevention Safety Plan: Reviewed with patient and patient family. Pt is a high fall risk Admission Screening: Completed 6700 Orientation: Patient has been oriented to the unit, staff and to the room. Pt lying comfortably in bed with no further needs stated at this time. Family member present at bedside. Call light within reach, will continue to monitor.  Feliciana RossettiLaura Edelin Fryer, RN, BSN

## 2014-12-19 NOTE — Progress Notes (Signed)
ANTIBIOTIC CONSULT NOTE - INITIAL  Pharmacy Consult for Vancocin, Azactam, and Levaquin Indication: rule out sepsis  Allergies  Allergen Reactions  . Amoxicillin     NAUSEATED  . Codeine     VOMITTING    Patient Measurements: Height:  (157.5 cm) Weight: 137 lb 12.6 oz (62.5 kg) IBW/kg (Calculated) : 50.1  Vital Signs: Temp: 99.3 F (37.4 C) (07/15 0112) Temp Source: Oral (07/15 0112) BP: 149/50 mmHg (07/15 0112) Pulse Rate: 99 (07/15 0112)   Medical History: Past Medical History  Diagnosis Date  . Gout   . CKD (chronic kidney disease)   . Diabetes   . TIA (transient ischemic attack)   . Dyslipidemia   . Anemia   . HTN (hypertension)   . PVD (peripheral vascular disease)   . Orthostatic hypotension 06/12/2014    Medications:  Prescriptions prior to admission  Medication Sig Dispense Refill Last Dose  . acetaminophen (TYLENOL) 325 MG tablet Take 650 mg by mouth every 6 (six) hours as needed (pain).   12/17/2014 at Unknown time  . allopurinol (ZYLOPRIM) 100 MG tablet Take 100 mg by mouth daily.   12/17/2014 at Unknown time  . aspirin 81 MG tablet Take 81 mg by mouth daily.   12/17/2014 at Unknown time  . calcitRIOL (ROCALTROL) 0.25 MCG capsule Take 0.25 mcg by mouth daily.   12/17/2014 at Unknown time  . gabapentin (NEURONTIN) 100 MG capsule Take 1 capsule (100 mg total) by mouth 3 (three) times daily. 90 capsule 0 12/17/2014 at Unknown time  . insulin glargine (LANTUS) 100 UNIT/ML injection Inject 0.15 mLs (15 Units total) into the skin at bedtime. 10 mL 11 12/17/2014 at Unknown time  . labetalol (NORMODYNE) 200 MG tablet Take 200 mg by mouth 2 (two) times daily.   5 12/17/2014 at Unknown time  . levothyroxine (SYNTHROID, LEVOTHROID) 25 MCG tablet Take 25 mcg by mouth daily before breakfast.   12/17/2014 at Unknown time  . magnesium oxide (MAG-OX) 400 MG tablet Take 400 mg by mouth daily.   12/17/2014 at Unknown time  . omeprazole (PRILOSEC) 20 MG capsule Take 20 mg by  mouth daily.   12/17/2014 at Unknown time  . predniSONE (DELTASONE) 5 MG tablet Take 5 mg by mouth daily with breakfast.   12/17/2014 at Unknown time  . rosuvastatin (CRESTOR) 20 MG tablet Take 20 mg by mouth daily.   12/17/2014 at Unknown time  . tacrolimus (PROGRAF) 1 MG capsule Take 1 mg by mouth 2 (two) times daily.   12/17/2014 at Unknown time   Scheduled:  . allopurinol  100 mg Oral Daily  . aspirin  81 mg Oral Daily  . calcitRIOL  0.25 mcg Oral Daily  . gabapentin  100 mg Oral TID  . heparin  5,000 Units Subcutaneous 3 times per day  . insulin aspart  0-9 Units Subcutaneous TID WC  . insulin glargine  10 Units Subcutaneous QHS  . labetalol  200 mg Oral BID  . [START ON 12/20/2014] levofloxacin (LEVAQUIN) IV  500 mg Intravenous Q24H  . levothyroxine  25 mcg Oral QAC breakfast  . magnesium oxide  400 mg Oral Daily  . pantoprazole  40 mg Oral Daily  . predniSONE  5 mg Oral Q breakfast  . rosuvastatin  20 mg Oral Daily  . sodium chloride  3 mL Intravenous Q12H  . tacrolimus  1 mg Oral BID   Infusions:  . sodium chloride 75 mL/hr at 12/19/14 0145    Assessment: 81yo  female presents via EMS for AMS, found with leukocytosis and acute on chronic renal failure, concern for sepsis, to begin IV ABX.  Goal of Therapy:  Vancomycin trough level 15-20 mcg/ml  Plan:  Rec'd vancomycin 1000mg , Levaquin 750mg , and Azactam 2g in ED; will await further labs prior to dosing vanc (expect dose of 1000mg  IV Q48H if SCr reaches baseline) and start Azactam 500mg  IV Q8H and change Levaquin to 500mg  IV Q48H; monitor CBC, Cx, levels prn.  Vernard GamblesVeronda Milta Croson, PharmD, BCPS  12/19/2014,2:41 AM

## 2014-12-19 NOTE — Progress Notes (Signed)
Utilization review completed. Loveah Like, RN, BSN. 

## 2014-12-19 NOTE — ED Notes (Signed)
MD at bedside. 

## 2014-12-19 NOTE — Progress Notes (Addendum)
[facility] PROGRESS NOTE  Mertha Baarslgie H Buendia ZOX:[id number] DOB: [birth year] DOA: 12/18/2014 PCP: Cala BradfordWHITE,CYNTHIA S, MD  Assessment/Plan: Fevers, possible sepsis with UTI: - Pt is immunosuppressed - Thus far, UA is suggestive of UTI. Still awaiting results of pan-cultures - For now, continue IV vancomycin, zosyn and Levaquin with next dose of vanc is anticipated on 7/17.  - Pending results of cultures, would narrow abx spectrum soon and consider d/c vanc within the next 24hrs if patient remains afebrile - prn Zofran for nausea --will get Procalcitonin and trend lactic acid levels  - Clinically improving  Acute encephalopathy:  -Much improved -Likely secondary to infection, possibly UTI -On abx as per above. Would narrow coverage if afebrile overnight  Acute renal failure superimposed on stage 3 chronic kidney disease:  - Likely due to prerenal secondary to dehydration. - IVF as above - Renal US unremarkable  Hx of kidney transplantation:  -Now has CKD-III. has functional AV fistula, not started dialysis yet. On prednisone 5 mg daily and tacrolimus. -Continue home medications -Give stress dose Solu-Cortef 50 milligrams 1  Gout:  -stable. -Continue allopurinol  DM-II: Last A1c 6.5 on 10/31/14, well controled. Patient is taking Lantus at home -Initially decreased Lantus dose from 15-10 units daily on admit -glucose in the mid-200's -Will resume 15 units lantus -SSI  TIA (transient ischemic attack): -Aspirin  HLD:  -Last LDL was 31 on 10/24/09 -Continue home medications: Crestor  HTN (hypertension): -Labetalol   DVT ppx:  -SQ Heparin  Code Status: Full Family Communication: Pt in room, family at bedside (indicate person spoken with, relationship, and if by phone, the number) Disposition Plan: D/c when abx narrowed and patient remains stable   Consultants:    Procedures:    Antibiotics:  Vancomycin 7/15>>>  Aztreonam 7/15>>>7/15  Levaquin  7/15>>>  Zosyn 7/15>>>  HPI/Subjective: Feels much better today  Objective: Filed Vitals:   12/19/14 0436 12/19/14 0537 12/19/14 0744 12/19/14 1557  BP: 138/37 140/44 120/40 111/52  Pulse: 81 84 78 68  Temp: 99.9 F (37.7 C) 99.3 F (37.4 C) 98.2 F (36.8 C) 98.2 F (36.8 C)  TempSrc: Oral Oral Oral Oral  Resp: 16 18 16 17   Height:      Weight:      SpO2: 100% 99% 100% 99%    Intake/Output Summary (Last 24 hours) at 12/19/14 1721 Last data filed at 12/19/14 1451  Gross per 24 hour  Intake 1022.5 ml  Output    150 ml  Net  872.5 ml   Filed Weights   12/18/14 2350 12/19/14 0112  Weight: 65.772 kg (145 lb) 62.5 kg (137 lb 12.6 oz)    Exam:   General:  Awake, in nad  Cardiovascular: regular, s1, s2  Respiratory: normal resp effort, no wheezing  Abdomen: soft, nondistended  Musculoskeletal: perfused, no clubbing   Data Reviewed: Basic Metabolic Panel:  Recent Labs Lab 12/18/14 2205 12/19/14 0835  NA 142 140  K 3.8 3.8  CL 102 105  CO2 24 23  GLUCOSE 111* 234*  BUN 59* 60*  CREATININE 3.95* 3.93*  CALCIUM 10.5* 9.5   Liver Function Tests:  Recent Labs Lab 12/18/14 2205  AST 24  ALT 13*  ALKPHOS 54  BILITOT 1.2  PROT 7.1  ALBUMIN 3.8   No results for input(s): LIPASE, AMYLASE in the last 168 hours. No results for input(s): AMMONIA in the last 168 hours. CBC:  Recent Labs Lab 12/18/14 2205 12/19/14 0835  WBC 10.5 11.2*  NEUTROABS 9.2*  --  HGB 12.0 10.2*  HCT 38.8 33.4*  MCV 89.6 89.3  PLT 182 167   Cardiac Enzymes: No results for input(s): CKTOTAL, CKMB, CKMBINDEX, TROPONINI in the last 168 hours. BNP (last 3 results) No results for input(s): BNP in the last 8760 hours.  ProBNP (last 3 results) No results for input(s): PROBNP in the last 8760 hours.  CBG:  Recent Labs Lab 12/19/14 0125 12/19/14 0744 12/19/14 1150 12/19/14 1556  GLUCAP 180* 202* 247* 261*    Recent Results (from the past 240 hour(s))  Culture,  blood (routine x 2)     Status: None (Preliminary result)   Collection Time: 12/18/14 10:25 PM  Result Value Ref Range Status   Specimen Description BLOOD RIGHT HAND  Final   Special Requests BOTTLES DRAWN AEROBIC ONLY 3CC  Final   Culture NO GROWTH < 24 HOURS  Final   Report Status PENDING  Incomplete     Studies: US Renal  12/19/2014   CLINICAL DATA:  Patient with worsening renal function.  EXAM: RENAL / URINARY TRACT ULTRASOUND COMPLETE  COMPARISON:  None.  FINDINGS: Right Kidney:  Length: 5.4 cm. Atrophic. No hydronephrosis. Increased renal cortical echogenicity. Sub cm hypoechoic lesion, too small to characterize.  Left Kidney:  Length: 7.5 cm. Atrophic. Increased renal cortical echogenicity. No hydronephrosis.  Bladder:  Appears normal for degree of bladder distention.  Right upper quadrant renal transplant: Normal renal cortical thickness and echogenicity. No hydronephrosis.  Right lower quadrant renal transplant: Normal renal cortical thickness and echogenicity. No hydronephrosis.  IMPRESSION: No hydronephrosis within the right upper quadrant for right lower quadrant renal transplant.  Atrophic native kidneys.  Consider dedicated renal transplant Doppler evaluation as clinically indicated.   Electronically Signed   By: Annia Belt M.D.   On: 12/19/2014 11:34   Dg Chest Port 1 View  12/18/2014   CLINICAL DATA:  81 year old female with shortness of breath  EXAM: PORTABLE CHEST - 1 VIEW  COMPARISON:  Radiograph dated 01/31/2011  FINDINGS: The heart size and mediastinal contours are within normal limits. Both lungs are clear. The visualized skeletal structures are unremarkable.  IMPRESSION: No active disease.   Electronically Signed   By: Elgie Collard M.D.   On: 12/18/2014 23:03    Scheduled Meds: . allopurinol  100 mg Oral Daily  . aspirin  81 mg Oral Daily  . calcitRIOL  0.25 mcg Oral Daily  . feeding supplement (GLUCERNA SHAKE)  237 mL Oral TID BM  . gabapentin  100 mg Oral TID  .  heparin  5,000 Units Subcutaneous 3 times per day  . insulin aspart  0-9 Units Subcutaneous TID WC  . insulin glargine  10 Units Subcutaneous QHS  . labetalol  200 mg Oral BID  . [START ON 12/20/2014] levofloxacin (LEVAQUIN) IV  500 mg Intravenous Q48H  . levothyroxine  25 mcg Oral QAC breakfast  . magnesium oxide  400 mg Oral Daily  . pantoprazole  40 mg Oral Daily  . piperacillin-tazobactam (ZOSYN)  IV  2.25 g Intravenous 3 times per day  . predniSONE  5 mg Oral Q breakfast  . rosuvastatin  20 mg Oral Daily  . sodium chloride  3 mL Intravenous Q12H  . tacrolimus  1 mg Oral BID  . [START ON 12/21/2014] vancomycin  750 mg Intravenous Q72H   Continuous Infusions: . sodium chloride 75 mL/hr at 12/19/14 0145    Principal Problem:   Fever Active Problems:   Gout   Acute renal failure superimposed on  stage 3 chronic kidney disease   Diabetes   TIA (transient ischemic attack)   Dyslipidemia   Anemia   HTN (hypertension)   Acute encephalopathy   Protein-calorie malnutrition, severe    CHIU, STEPHEN K  [facility] Pager [phone]. If 7PM-7AM, please contact night-coverage at [email or web address], password [facility] 12/19/2014, 5:21 PM  LOS: 1 day

## 2014-12-19 NOTE — ED Notes (Signed)
Transporting patient to new room assignment. 

## 2014-12-19 NOTE — Progress Notes (Addendum)
PT Cancellation Note  Patient Details Name: April Casey H Hissong MRN: [id number] DOB: 1931/11/07   Cancelled Treatment:    Reason Eval/Treat Not Completed: Patient not medically ready.  Patient remains on bedrest per orders.  MD:  Please write activity orders when appropriate for patient.  PT will initiate evaluation at that time.  Thank you.  16:44:  Patient continues to have bedrest orders.  Will return tomorrow for PT evaluation if appropriate.  Vena AustriaDavis, Ronalee Scheunemann H 12/19/2014, 9:56 AM Durenda HurtSusan H. Renaldo Fiddleravis, PT, [facility] Acute Rehab Services Pager [phone]

## 2014-12-19 NOTE — Progress Notes (Addendum)
ANTIBIOTIC CONSULT NOTE -Follow up  Pharmacy Consult for Vancocin, , and Levaquin + Zosyn (see addendum) Indication: Fever,  rule out sepsis  Allergies  Allergen Reactions  . Amoxicillin     NAUSEATED  . Codeine     VOMITTING    Patient Measurements: Height: 5\' 2"  (157.5 cm) Weight: 137 lb 12.6 oz (62.5 kg) IBW/kg (Calculated) : 50.1  Vital Signs: Temp: 98.2 F (36.8 C) (07/15 0744) Temp Source: Oral (07/15 0744) BP: 120/40 mmHg (07/15 0744) Pulse Rate: 78 (07/15 0744)   Medical History: Past Medical History  Diagnosis Date  . Gout   . CKD (chronic kidney disease)   . Diabetes   . TIA (transient ischemic attack)   . Dyslipidemia   . Anemia   . HTN (hypertension)   . PVD (peripheral vascular disease)   . Orthostatic hypotension 06/12/2014    Medications:  Prescriptions prior to admission  Medication Sig Dispense Refill Last Dose  . acetaminophen (TYLENOL) 325 MG tablet Take 650 mg by mouth every 6 (six) hours as needed (pain).   12/17/2014 at Unknown time  . allopurinol (ZYLOPRIM) 100 MG tablet Take 100 mg by mouth daily.   12/17/2014 at Unknown time  . aspirin 81 MG tablet Take 81 mg by mouth daily.   12/17/2014 at Unknown time  . calcitRIOL (ROCALTROL) 0.25 MCG capsule Take 0.25 mcg by mouth daily.   12/17/2014 at Unknown time  . gabapentin (NEURONTIN) 100 MG capsule Take 1 capsule (100 mg total) by mouth 3 (three) times daily. 90 capsule 0 12/17/2014 at Unknown time  . insulin glargine (LANTUS) 100 UNIT/ML injection Inject 0.15 mLs (15 Units total) into the skin at bedtime. 10 mL 11 12/17/2014 at Unknown time  . labetalol (NORMODYNE) 200 MG tablet Take 200 mg by mouth 2 (two) times daily.   5 12/17/2014 at Unknown time  . levothyroxine (SYNTHROID, LEVOTHROID) 25 MCG tablet Take 25 mcg by mouth daily before breakfast.   12/17/2014 at Unknown time  . magnesium oxide (MAG-OX) 400 MG tablet Take 400 mg by mouth daily.   12/17/2014 at Unknown time  . omeprazole (PRILOSEC) 20 MG  capsule Take 20 mg by mouth daily.   12/17/2014 at Unknown time  . predniSONE (DELTASONE) 5 MG tablet Take 5 mg by mouth daily with breakfast.   12/17/2014 at Unknown time  . rosuvastatin (CRESTOR) 20 MG tablet Take 20 mg by mouth daily.   12/17/2014 at Unknown time  . tacrolimus (PROGRAF) 1 MG capsule Take 1 mg by mouth 2 (two) times daily.   12/17/2014 at Unknown time   Scheduled:  . allopurinol  100 mg Oral Daily  . aspirin  81 mg Oral Daily  . aztreonam  500 mg Intravenous 3 times per day  . calcitRIOL  0.25 mcg Oral Daily  . gabapentin  100 mg Oral TID  . heparin  5,000 Units Subcutaneous 3 times per day  . insulin aspart  0-9 Units Subcutaneous TID WC  . insulin glargine  10 Units Subcutaneous QHS  . labetalol  200 mg Oral BID  . [START ON 12/20/2014] levofloxacin (LEVAQUIN) IV  500 mg Intravenous Q48H  . levothyroxine  25 mcg Oral QAC breakfast  . magnesium oxide  400 mg Oral Daily  . pantoprazole  40 mg Oral Daily  . predniSONE  5 mg Oral Q breakfast  . rosuvastatin  20 mg Oral Daily  . sodium chloride  3 mL Intravenous Q12H  . tacrolimus  1 mg Oral BID  . [  START ON 12/21/2014] vancomycin  750 mg Intravenous Q72H   Infusions:  . sodium chloride 75 mL/hr at 12/19/14 0145    Assessment: 81yo female presented on 12/18/14 PM via EMS for AMS and increased urinary frequency, found with leukocytosis and acute on chronic renal failure, concern for sepsis, to begin IV ABX.  H/o kidney transplant. Has Acute renal failure superimposed on stage 3 chronic kidney disease: Likely due to prerenal secondary to dehydration. Dr. Rhona Leavens notes that patient has functional AV fistula, not started dialysis yet. Acute encephalopathy: Likely secondary to infection, possibly UTI Tm 100.9 , Tc 98.2, WBC 11.2K <10.5K,Procalcitonin = 10.5 SCr 3.93 slight decrease from 3.95, BUN 60  Received 1st doses of  vancomycin , Levaquin , and Azactam 2g in ED   Goal of Therapy:  Vancomycin trough level 15-20  mcg/ml  Plan:  Vancomycin  IV q72 hr (not due until 7/17 @ 10pm) f/u renal function, BMP, consider vancomycin random level prior to dose) Levaquin   IV Q48H Azactam  IV Q8H  Monitor CBC, Cx, levels prn.  Thank you for allowing pharmacy to be part of this patients care team. April Casey, RPh Clinical Pharmacist Pager: [phone] 12/19/2014,11:38 AM   -------------------------------------------------------------------------------------------------------------- Addendum:  Given the patient's "allergy" to Amoxicillin as nausea - will discontinue Azactam and change to Zosyn for empiric coverage. Discussed and approved by Dr. Rhona Leavens.  Plan 1. D/c Azactam 2. Start Zosyn 3.375g IV every 8 hours  April Casey, PharmD, BCPS Clinical Pharmacist Pager: [phone] 12/19/2014 1:55 PM

## 2014-12-19 NOTE — Progress Notes (Signed)
Initial Nutrition Assessment  DOCUMENTATION CODES:   Severe malnutrition in context of chronic illness   Pt meets criteria for severe MALNUTRITION in the context of chronic illness as evidenced by a 9.3% weight loss in 2 months, energy intake </= 75% for >/= 1 month, and severe muscle mass loss.  INTERVENTION:   Provide Glucerna Shake po TID, each supplement provides 220 kcal and 10 grams of protein.  Encourage adequate PO intake.   NUTRITION DIAGNOSIS:   Inadequate oral intake related to poor appetite as evidenced by meal completion < 25%.  GOAL:   Patient will meet greater than or equal to 90% of their needs  MONITOR:   PO intake, Supplement acceptance, Weight trends, Labs, I & O's  REASON FOR ASSESSMENT:   Malnutrition Screening Tool    ASSESSMENT:   81 y.o. female with PMH of hypertension, hyperlipidemia, diabetes mellitus, GERD, hypothyroidism, gout, kidney transportation (on prednisone and tacrolimus), TIA, PVD, chronic kidney disease-stage III, who presents with fever, increased urinary frequency and confusion.  Pt reports having a decreased appetite which has been ongoing over the past couple of years. She reports she has been trying to consume 3 meals a day however has been unsuccessful as she says her taste buds have been altered and food has no taste for her. Daughter reports pt is not eating well and portions have been small. Pt endorses weight loss. Per Epic weight records, pt with a 9.3% weight loss in 2 months. Pt does drink Boost at home once daily. Current meal completion is <25%. RD to order nutritional supplements to aid in caloric and protein needs. Pt was educated to continue supplementation at home at least twice daily especially if po is poor.   Nutrition-Focused physical exam completed. Findings are no fat depletion, moderate muscle depletion, and no edema.   Labs: Low GFR. High BUN and creatinine.   Diet Order:  Diet Carb Modified Fluid consistency::  Thin; Room service appropriate?: Yes  Skin:  Reviewed, no issues  Last BM:  7/14  Height:   Ht Readings from Last 1 Encounters:  12/19/14 5\' 2"  (1.575 m)    Weight:   Wt Readings from Last 1 Encounters:  12/19/14 137 lb 12.6 oz (62.5 kg)    Ideal Body Weight:  50 kg  Wt Readings from Last 10 Encounters:  12/19/14 137 lb 12.6 oz (62.5 kg)  10/31/14 151 lb (68.493 kg)  06/12/14 156 lb 3.2 oz (70.852 kg)  08/03/13 168 lb (76.204 kg)  04/19/13 169 lb (76.658 kg)    BMI:  Body mass index is 25.2 kg/(m^2).  Estimated Nutritional Needs:   Kcal:  1700-1850  Protein:  75-85 grams  Fluid:  1.7 - 1.8 L/day  EDUCATION NEEDS:   Education needs addressed  Roslyn SmilingStephanie Amelya Mabry, MS, RD, LDN Pager # [phone] After hours/ weekend pager # [phone]

## 2014-12-20 DIAGNOSIS — R509 Fever, unspecified: Secondary | ICD-10-CM

## 2014-12-20 LAB — CBC
HEMATOCRIT: 31.6 % — AB (ref 36.0–46.0)
HEMOGLOBIN: 9.8 g/dL — AB (ref 12.0–15.0)
MCH: 27.8 pg (ref 26.0–34.0)
MCHC: 31 g/dL (ref 30.0–36.0)
MCV: 89.8 fL (ref 78.0–100.0)
Platelets: 147 10*3/uL — ABNORMAL LOW (ref 150–400)
RBC: 3.52 MIL/uL — ABNORMAL LOW (ref 3.87–5.11)
RDW: 15.4 % (ref 11.5–15.5)
WBC: 8.9 10*3/uL (ref 4.0–10.5)

## 2014-12-20 LAB — GLUCOSE, CAPILLARY
GLUCOSE-CAPILLARY: 91 mg/dL (ref 65–99)
GLUCOSE-CAPILLARY: 213 mg/dL — AB (ref 65–99)
Glucose-Capillary: 272 mg/dL — ABNORMAL HIGH (ref 65–99)
Glucose-Capillary: 136 mg/dL — ABNORMAL HIGH (ref 65–99)
Glucose-Capillary: 149 mg/dL — ABNORMAL HIGH (ref 65–99)

## 2014-12-20 LAB — BASIC METABOLIC PANEL
Anion gap: 12 (ref 5–15)
BUN: 66 mg/dL — ABNORMAL HIGH (ref 6–20)
CALCIUM: 9.6 mg/dL (ref 8.9–10.3)
CHLORIDE: 104 mmol/L (ref 101–111)
CO2: 24 mmol/L (ref 22–32)
Creatinine, Ser: 4.09 mg/dL — ABNORMAL HIGH (ref 0.44–1.00)
GFR calc Af Amer: 11 mL/min — ABNORMAL LOW (ref >60)
GFR calc non Af Amer: 9 mL/min — ABNORMAL LOW (ref >60)
GLUCOSE: 118 mg/dL — AB (ref 65–99)
Potassium: 3.1 mmol/L — ABNORMAL LOW (ref 3.5–5.1)
Sodium: 140 mmol/L (ref 135–145)

## 2014-12-20 LAB — CREATININE, URINE, RANDOM: Creatinine, Urine: 90.47 mg/dL

## 2014-12-20 LAB — SODIUM, URINE, RANDOM: Sodium, Ur: 30 mmol/L

## 2014-12-20 MED ORDER — POTASSIUM CHLORIDE CRYS ER 20 MEQ PO TBCR
40.0000 meq | EXTENDED_RELEASE_TABLET | Freq: Once | ORAL | Status: AC
  Administered 2014-12-20: 40 meq via ORAL
  Filled 2014-12-20: qty 2

## 2014-12-20 NOTE — Evaluation (Signed)
Physical Therapy Evaluation Patient Details Name: April Casey MRN: [id number] DOB: 1931/11/28 Today's Date: 12/20/2014   History of Present Illness  pt is an 81 y/o female admitted with fever, increased urinary frequency and confusion, suspected due to UTI.  Clinical Impression  Pt admitted with/for confusion, fever likely due to UTI.  Pt currently limited functionally due to the problems listed below.  (see problems list.)  Pt will benefit from PT to maximize function and safety to be able to get home safely with available assist of family.     Follow Up Recommendations No PT follow up    Equipment Recommendations  None recommended by PT    Recommendations for Other Services       Precautions / Restrictions Precautions Precautions: Fall      Mobility  Bed Mobility               General bed mobility comments: sitting EOB on arrival  Transfers Overall transfer level: Needs assistance   Transfers: Sit to/from Stand Sit to Stand: Supervision         General transfer comment: safe transition  Ambulation/Gait Ambulation/Gait assistance: Supervision Ambulation Distance (Feet): 100 Feet Assistive device: None Gait Pattern/deviations: Step-through pattern Gait velocity: slower with little increase of speed on cue   General Gait Details: generally steady even with scanning and turns  Information systems managertairs            Wheelchair Mobility    Modified Rankin (Stroke Patients Only)       Balance Overall balance assessment: Needs assistance Sitting-balance support: No upper extremity supported Sitting balance-Leahy Scale: Good Sitting balance - Comments: accepted moderate challenge   Standing balance support: No upper extremity supported Standing balance-Leahy Scale: Fair                               Pertinent Vitals/Pain Pain Assessment: Faces Faces Pain Scale: Hurts a little bit Pain Intervention(s): Monitored during session    Home Living  Family/patient expects to be discharged to:: Private residence Living Arrangements: Other relatives (neice living with her) Available Help at Discharge: Family;Available PRN/intermittently Type of Home: House Home Access: Stairs to enter Entrance Stairs-Rails: LawyerLeft;Right Entrance Stairs-Number of Steps: 3 Home Layout: One level Home Equipment: Walker - 2 wheels;Cane - quad Additional Comments: doesn't get in the tub/shower anymore    Prior Function Level of Independence: Independent         Comments: talks about s/s consistent with peripheral neuropathy.     Hand Dominance        Extremity/Trunk Assessment   Upper Extremity Assessment: Defer to OT evaluation           Lower Extremity Assessment: Overall WFL for tasks assessed;Generalized weakness (proximal weakness and truncal weaknesses)         Communication   Communication: No difficulties  Cognition Arousal/Alertness: Awake/alert Behavior During Therapy: WFL for tasks assessed/performed Overall Cognitive Status: Within Functional Limits for tasks assessed                      General Comments      Exercises        Assessment/Plan    PT Assessment Patient needs continued PT services  PT Diagnosis Generalized weakness   PT Problem List Decreased strength;Decreased activity tolerance;Decreased balance;Decreased mobility  PT Treatment Interventions Gait training;Stair training;Functional mobility training;Therapeutic activities;Balance training;Patient/family education   PT Goals (Current goals can be found  in the Care Plan section) Acute Rehab PT Goals Patient Stated Goal: home eventually independent PT Goal Formulation: With patient Time For Goal Achievement: 12/27/14 Potential to Achieve Goals: Good    Frequency Min 3X/week   Barriers to discharge        Co-evaluation               End of Session   Activity Tolerance: Patient tolerated treatment well Patient left: in  bed;Other (comment);with family/visitor present (sitting EOB) Nurse Communication: Mobility status         Time: 6962-9528 PT Time Calculation (min) (ACUTE ONLY): 32 min   Charges:   PT Evaluation $Initial PT Evaluation Tier I: 1 Procedure PT Treatments $Gait Training: 8-22 mins   PT G Codes:        April Casey, April Casey 12/20/2014, 4:03 PM 12/20/2014  [place], PT [phone]  (pager)

## 2014-12-20 NOTE — Progress Notes (Signed)
OT Cancellation Note  Patient Details Name: April Casey MRN: [id number] DOB: 03-12-1932   Cancelled Treatment:    Reason Eval/Treat Not Completed: Other (comment). Pt on bedrest. Please update activity orders when appropriate for OT evaluation.  Earlie RavelingStraub, Jeni Duling L OTR/L [phone] 12/20/2014, 8:03 AM

## 2014-12-20 NOTE — Progress Notes (Addendum)
Patient ID: April Casey, female   DOB: 05-01-1932, 81 y.o.   MRN: [id number]  [facility] PROGRESS NOTE  April Casey:[id number] DOB: 02-14-1932 DOA: 12/18/2014 PCP: Vidal Schwalbe, MD  Brief narrative:    81 y.o. female with PMH of hypertension, hyperlipidemia, diabetes mellitus, GERD, hypothyroidism, gout, kidney transportation (on prednisone and tacrolimus), TIA, PVD, chronic kidney disease-stage III, who presented with fever, increased urinary frequency and confusion.  In ED, patient was found to have WBC 10.5, temperature 100.9, tachycardia, worsening renal function, negative chest x-ray, pending urinalysis.   Assessment/Plan:    Sepsis secondary to UTI, g- rods bacteremia likely from urinary source  - Pt met criteria for sepsis with HR up to 100 and RR up to 24, T 100.54F, source UTI - preliminary blood cultures with g- rods  - started on broad spectrum due to immunocompromised state but will stop vanc  - continue to narrow down once more results are back - follow up on final urine and blood culture   Acute encephalopathy, metabolic, secondary to UTI  - improved and better overall per family member  - still on broad spectrum ABX but plan on narrowing down   Hypokalemia - supplement and repeat BMP in AM  Acute renal failure superimposed on stage 3 chronic kidney disease:  - Likely due to prerenal secondary to dehydration. - Renal US unremarkable - Cr continues trending up despite IVF  - stop vanc  - repeat BMP in AM  Hx of kidney transplantation:  - Now has CKD-III. has functional AV fistula, not started dialysis yet - continue prednisone 5 mg daily and tacrolimus  Anemia of chronic disease, CKD - drop in Hg since admission from IVF - no signs of active bleeding - repeat CBC in AM  Acute thrombocytopenia - monitor closely for signs for bleeding - CBC in AM  Hypercalcemia - resolved with IVF  Gout:  - stable - Continue allopurinol  DM-II  with complications of neuropathy and CKD - Last A1c 6.5 on 10/31/14, well controled. Patient is taking Lantus at home - continue Lantus and SSI - continue neurontin as per home medical regimen   TIA (transient ischemic attack) - continue Aspirin  HLD - Last LDL was 31 on 10/24/09 - Continue home medications: Crestor  HTN (hypertension) - reasonable inpatient control - continue Labetalol   Severe PCM - in the context of chronic illness  - appreciate nutritionist assistance   DVT prophylaxis - Heparin SQ  Code Status: Full.  Family Communication:  plan of care discussed with the patient and daughter  Disposition Plan: Home when stable.   IV access:  Peripheral IV  Procedures and diagnostic studies:    US Renal 12/19/2014  No hydronephrosis within the right upper quadrant for right lower quadrant renal transplant.   Dg Chest Port 1 View 12/18/2014  No active disease.     Medical Consultants:  None  Other Consultants:  Nutritionist PT  IAnti-Infectives:   Vancomycin 7/15>>>7/16 Aztreonam 7/15>>>7/15 Levaquin 7/15>>> Zosyn 7/15>>>  Faye Ramsay, MD  [facility] Pager [phone]  If 7PM-7AM, please contact night-coverage [email or web address] Password [email or web address] 12/20/2014, 6:30 PM   LOS: 2 days   HPI/Subjective: No events overnight.   Objective: Filed Vitals:   12/19/14 2206 12/20/14 0443 12/20/14 0849 12/20/14 1635  BP: 124/35 152/52 135/50 113/39  Pulse: 75 83 104 69  Temp: 98.3 F (36.8 C) 98.5 F (36.9 C) 98.8 F (37.1 C) 98.2 F (36.8 C)  TempSrc:   Oral Oral  Resp: '18 18 18 17  '$ Height:      Weight: 62.596 kg (138 lb)     SpO2: 99% 97% 100% 100%    Intake/Output Summary (Last 24 hours) at 12/20/14 1830 Last data filed at 12/20/14 1823  Gross per 24 hour  Intake   1080 ml  Output    250 ml  Net    830 ml    Exam:   General:  Pt is alert, follows commands appropriately, not in acute distress  Cardiovascular: Regular rate and rhythm, no rubs, no  gallops  Respiratory: Clear to auscultation bilaterally, no wheezing, diminished breath sounds at bases   Abdomen: Soft, non tender, non distended, bowel sounds present, no guarding  Extremities: pulses DP and PT palpable bilaterally  Data Reviewed: Basic Metabolic Panel:  Recent Labs Lab 12/18/14 2205 12/19/14 0835 12/20/14 0451  NA 142 140 140  K 3.8 3.8 3.1*  CL 102 105 104  CO2 $Re'24 23 24  'Ovf$ GLUCOSE 111* 234* 118*  BUN 59* 60* 66*  CREATININE 3.95* 3.93* 4.09*  CALCIUM 10.5* 9.5 9.6   Liver Function Tests:  Recent Labs Lab 12/18/14 2205  AST 24  ALT 13*  ALKPHOS 54  BILITOT 1.2  PROT 7.1  ALBUMIN 3.8   CBC:  Recent Labs Lab 12/18/14 2205 12/19/14 0835 12/20/14 0451  WBC 10.5 11.2* 8.9  NEUTROABS 9.2*  --   --   HGB 12.0 10.2* 9.8*  HCT 38.8 33.4* 31.6*  MCV 89.6 89.3 89.8  PLT 182 167 147*   CBG:  Recent Labs Lab 12/19/14 1946 12/19/14 2203 12/20/14 0730 12/20/14 1149 12/20/14 1633  GLUCAP 355* 272* 91 136* 213*    Recent Results (from the past 240 hour(s))  Culture, blood (routine x 2)     Status: None (Preliminary result)   Collection Time: 12/18/14 10:25 PM  Result Value Ref Range Status   Specimen Description BLOOD RIGHT HAND  Final   Special Requests BOTTLES DRAWN AEROBIC ONLY 3CC  Final   Culture  Setup Time   Final    GRAM NEGATIVE RODS AEROBIC BOTTLE ONLY CRITICAL RESULT CALLED TO, READ BACK BY AND VERIFIED WITH: TO LDESHAZO(RN)  BY TCLEVELAND AT 12/20/14 AT 4:51AM    Culture NO GROWTH 2 DAYS  Final   Report Status PENDING  Incomplete  Urine culture     Status: None (Preliminary result)   Collection Time: 12/18/14 11:24 PM  Result Value Ref Range Status   Specimen Description URINE, RANDOM  Final   Special Requests NONE  Final   Culture CULTURE REINCUBATED FOR BETTER GROWTH  Final   Report Status PENDING  Incomplete     Scheduled Meds: . allopurinol  100 mg Oral Daily  . aspirin  81 mg Oral Daily  . calcitRIOL  0.25 mcg  Oral Daily  . gabapentin  100 mg Oral TID  . heparin  5,000 Units Subcutaneous 3 times per day  . insulin aspart  0-9 Units Subcutaneous TID WC  . insulin glargine  10 Units Subcutaneous QHS  . labetalol  200 mg Oral BID  . levofloxacin IV  500 mg Intravenous Q48H  . levothyroxine  25 mcg Oral QAC breakfast  . magnesium oxide  400 mg Oral Daily  . pantoprazole  40 mg Oral Daily  . piperacillin-tazobactam (ZOSYN)  IV  2.25 g Intravenous 3 times per day  . predniSONE  5 mg Oral Q breakfast  . rosuvastatin  20 mg Oral Daily  . tacrolimus  1 mg Oral BID  . [START ON 12/21/2014] vancomycin  750 mg Intravenous Q72H   Continuous Infusions: . sodium chloride 75 mL/hr at 12/20/14 1729

## 2014-12-20 NOTE — Progress Notes (Signed)
CRITICAL VALUE ALERT  Critical value received:  Positive blood cultures: gram negative in aerobic bottle  Date of notification:  12/20/14  Time of notification:  0451  Critical value read back:Yes.    Nurse who received alert:  Feliciana RossettiLaura Jessicalynn Deshong  MD notified (1st page):  Benedetto Coons. Callahan  Time of first page:  0457  MD notified (2nd page):  Time of second page:  Responding MD:  Benedetto Coons. Callahan  Time MD responded:  [phone]

## 2014-12-21 LAB — CBC
HCT: 28.3 % — ABNORMAL LOW (ref 36.0–46.0)
Hemoglobin: 8.7 g/dL — ABNORMAL LOW (ref 12.0–15.0)
MCH: 27.1 pg (ref 26.0–34.0)
MCHC: 30.7 g/dL (ref 30.0–36.0)
MCV: 88.2 fL (ref 78.0–100.0)
PLATELETS: 158 10*3/uL (ref 150–400)
RBC: 3.21 MIL/uL — ABNORMAL LOW (ref 3.87–5.11)
RDW: 15.3 % (ref 11.5–15.5)
WBC: 6.2 10*3/uL (ref 4.0–10.5)

## 2014-12-21 LAB — BASIC METABOLIC PANEL
Anion gap: 7 (ref 5–15)
BUN: 54 mg/dL — ABNORMAL HIGH (ref 6–20)
CALCIUM: 9.3 mg/dL (ref 8.9–10.3)
CO2: 22 mmol/L (ref 22–32)
CREATININE: 3.72 mg/dL — AB (ref 0.44–1.00)
Chloride: 112 mmol/L — ABNORMAL HIGH (ref 101–111)
GFR calc non Af Amer: 11 mL/min — ABNORMAL LOW (ref >60)
GFR, EST AFRICAN AMERICAN: 12 mL/min — AB (ref >60)
Glucose, Bld: 205 mg/dL — ABNORMAL HIGH (ref 65–99)
Potassium: 4.6 mmol/L (ref 3.5–5.1)
SODIUM: 141 mmol/L (ref 135–145)

## 2014-12-21 LAB — URINE CULTURE: Culture: >=100000

## 2014-12-21 LAB — GLUCOSE, CAPILLARY
GLUCOSE-CAPILLARY: 55 mg/dL — AB (ref 65–99)
GLUCOSE-CAPILLARY: 113 mg/dL — AB (ref 65–99)
Glucose-Capillary: 97 mg/dL (ref 65–99)
Glucose-Capillary: 191 mg/dL — ABNORMAL HIGH (ref 65–99)
Glucose-Capillary: 240 mg/dL — ABNORMAL HIGH (ref 65–99)

## 2014-12-21 LAB — CLOSTRIDIUM DIFFICILE BY PCR: Toxigenic C. Difficile by PCR: NEGATIVE

## 2014-12-21 NOTE — Progress Notes (Signed)
Hypoglycemic Event  CBG: 55  Treatment: carb snack  Symptoms: none  Follow-up CBG: Time: 0834 CBG Result:97  Possible Reasons for Event: unknown  Comments/MD notified: yes    April LappingRebecca A Casey  Remember to initiate Hypoglycemia Order Set & complete

## 2014-12-21 NOTE — Progress Notes (Signed)
Patient ID: April Casey, female   DOB: June 19, 1931, 81 y.o.   MRN: [id number]  [facility] PROGRESS NOTE  April Casey YYT:[id number] DOB: [birth year] DOA: 12/18/2014 PCP: Vidal Schwalbe, MD  Brief narrative:    81 y.o. female with PMH of hypertension, hyperlipidemia, diabetes mellitus, GERD, hypothyroidism, gout, kidney transportation (on prednisone and tacrolimus), TIA, PVD, chronic kidney disease-stage III, who presented with fever, increased urinary frequency and confusion.  In ED, patient was found to have WBC 10.5, temperature 100.9, tachycardia, worsening renal function, negative chest x-ray, pending urinalysis.   Assessment/Plan:    Sepsis secondary to UTI, g- rods bacteremia likely from UTI - Pt met criteria for sepsis with HR up to 100 and RR up to 24, T 100.4F, source E. Coli UTI - preliminary blood cultures with g- rods and final report still pending  - vancomycin has been discontinued - continue zosyn per sensitivity report on urine culture but will not change ABX yet until blood culture is back  - sepsis etiology resolving  Acute encephalopathy, metabolic, secondary to UTI  - improved and better overall per family member  - continue Zosyn as noted above   Hypokalemia - supplemented, BMP pending   Acute renal failure superimposed on stage 3 chronic kidney disease:  - Likely due to prerenal secondary to dehydration. - Renal US unremarkable - BMP pending this AM   Hx of kidney transplantation:  - Now has CKD-III. has functional AV fistula, not started dialysis yet - continue prednisone 5 mg daily and tacrolimus  Anemia of chronic disease, CKD - drop in Hg since admission from IVF - no signs of active bleeding, CBC pending thi a - repeat CBC in AM  Acute thrombocytopenia - monitor closely for signs for bleeding - CBC in AM  Hypercalcemia - resolved with IVF  Gout:  - stable - Continue allopurinol  DM-II with complications of neuropathy and  CKD - Last A1c 6.5 on 10/31/14, well controled. Patient is taking Lantus at home - continue Lantus and SSI - continue neurontin as per home medical regimen   TIA (transient ischemic attack) - continue Aspirin  HLD - Last LDL was 31 on 10/24/09 - Continue home medications: Crestor  HTN (hypertension) - reasonable inpatient control - continue Labetalol   Severe PCM - in the context of chronic illness  - appreciate nutritionist assistance   DVT prophylaxis - Heparin SQ  Code Status: Full.  Family Communication:  plan of care discussed with the patient and daughter  Disposition Plan: Home in 24 - 48 hours when blood cultures are back   IV access:  Peripheral IV  Procedures and diagnostic studies:    US Renal 12/19/2014  No hydronephrosis within the right upper quadrant for right lower quadrant renal transplant.   Dg Chest Port 1 View 12/18/2014  No active disease.     Medical Consultants:  None  Other Consultants:  Nutritionist PT  IAnti-Infectives:   Vancomycin 7/15>>>7/16 Aztreonam 7/15>>>7/15 Levaquin 7/15>>>7/17 Zosyn 7/15>>>  Faye Ramsay, MD  [facility] Pager [phone]  If 7PM-7AM, please contact night-coverage [email or web address] Password [email or web address] 12/21/2014, 12:12 PM   LOS: 3 days   HPI/Subjective: No events overnight.   Objective: Filed Vitals:   12/20/14 2225 12/21/14 0459 12/21/14 0500 12/21/14 0932  BP: 150/56 170/60  160/63  Pulse: 71 77  81  Temp: 97.8 F (36.6 C) 98.2 F (36.8 C)  98.1 F (36.7 C)  TempSrc: Oral Oral  Oral  Resp: _0 Height:  Weight: 60.782 kg (134 lb)  60.78 kg (133 lb 15.9 oz)   SpO2: 99% 99%  99%    Intake/Output Summary (Last 24 hours) at 12/21/14 1212 Last data filed at 12/21/14 1111  Gross per 24 hour  Intake 2958.75 ml  Output     80 ml  Net 2878.75 ml    Exam:   General:  Pt is alert, follows commands appropriately, not in acute distress  Cardiovascular: Regular rate and rhythm, no rubs, no  gallops  Respiratory: Clear to auscultation bilaterally, no wheezing, diminished breath sounds at bases   Abdomen: Soft, non tender, non distended, bowel sounds present, no guarding  Extremities: pulses DP and PT palpable bilaterally  Data Reviewed: Basic Metabolic Panel:  Recent Labs Lab 12/18/14 2205 12/19/14 0835 12/20/14 0451  NA 142 140 140  K 3.8 3.8 3.1*  CL 102 105 104  CO2 _0 GLUCOSE 111* 234* 118*  BUN 59* 60* 66*  CREATININE 3.95* 3.93* 4.09*  CALCIUM 10.5* 9.5 9.6   Liver Function Tests:  Recent Labs Lab 12/18/14 2205  AST 24  ALT 13*  ALKPHOS 54  BILITOT 1.2  PROT 7.1  ALBUMIN 3.8   CBC:  Recent Labs Lab 12/18/14 2205 12/19/14 0835 12/20/14 0451  WBC 10.5 11.2* 8.9  NEUTROABS 9.2*  --   --   HGB 12.0 10.2* 9.8*  HCT 38.8 33.4* 31.6*  MCV 89.6 89.3 89.8  PLT 182 167 147*   CBG:  Recent Labs Lab 12/20/14 1149 12/20/14 1633 12/20/14 2221 12/21/14 0758 12/21/14 0834  GLUCAP 136* 213* 149* 55* 97    Recent Results (from the past 240 hour(s))  Culture, blood (routine x 2)     Status: None (Preliminary result)   Collection Time: 12/18/14 10:25 PM  Result Value Ref Range Status   Specimen Description BLOOD RIGHT HAND  Final   Special Requests BOTTLES DRAWN AEROBIC ONLY 3CC  Final   Culture  Setup Time   Final    GRAM NEGATIVE RODS AEROBIC BOTTLE ONLY CRITICAL RESULT CALLED TO, READ BACK BY AND VERIFIED WITH: TO LDESHAZO(RN)  BY TCLEVELAND AT 12/20/14 AT 4:51AM    Culture NO GROWTH 2 DAYS  Final   Report Status PENDING  Incomplete  Urine culture     Status: None   Collection Time: 12/18/14 11:24 PM  Result Value Ref Range Status   Specimen Description URINE, RANDOM  Final   Special Requests NONE  Final   Culture >=100,000 COLONIES/mL ESCHERICHIA COLI  Final   Report Status 12/21/2014 FINAL  Final   Organism ID, Bacteria ESCHERICHIA COLI  Final      Susceptibility   Escherichia coli - MIC*    AMPICILLIN >=32 RESISTANT  Resistant     CEFAZOLIN <=4 SENSITIVE Sensitive     CEFTRIAXONE <=1 SENSITIVE Sensitive     CIPROFLOXACIN >=4 RESISTANT Resistant     GENTAMICIN <=1 SENSITIVE Sensitive     IMIPENEM <=0.25 SENSITIVE Sensitive     NITROFURANTOIN <=16 SENSITIVE Sensitive     TRIMETH/SULFA <=20 SENSITIVE Sensitive     AMPICILLIN/SULBACTAM 16 INTERMEDIATE Intermediate     PIP/TAZO <=4 SENSITIVE Sensitive     * >=100,000 COLONIES/mL ESCHERICHIA COLI  Clostridium Difficile by PCR (not at [facility])     Status: None   Collection Time: 12/21/14  4:50 AM  Result Value Ref Range Status   C difficile by pcr NEGATIVE NEGATIVE Final     Scheduled Meds: . allopurinol  100 mg Oral  Daily  . aspirin  81 mg Oral Daily  . calcitRIOL  0.25 mcg Oral Daily  . gabapentin  100 mg Oral TID  . heparin  5,000 Units Subcutaneous 3 times per day  . insulin aspart  0-9 Units Subcutaneous TID WC  . insulin glargine  10 Units Subcutaneous QHS  . labetalol  200 mg Oral BID  . levofloxacin IV  500 mg Intravenous Q48H  . levothyroxine  25 mcg Oral QAC breakfast  . magnesium oxide  400 mg Oral Daily  . pantoprazole  40 mg Oral Daily  . piperacillin-tazobactam (ZOSYN)  IV  2.25 g Intravenous 3 times per day  . predniSONE  5 mg Oral Q breakfast  . rosuvastatin  20 mg Oral Daily  . tacrolimus  1 mg Oral BID  . [START ON 12/21/2014] vancomycin  750 mg Intravenous Q72H   Continuous Infusions:

## 2014-12-21 NOTE — Progress Notes (Signed)
Pt had 4 loose stools this shift, on call Claiborne Billingsallahan NP notified. Made an order to send a sample for C dif PCR and place the pt on enteric precaution. Above order acknowledged and carried out. Pt currently on enteric precaution isolation.

## 2014-12-22 LAB — BASIC METABOLIC PANEL
Anion gap: 8 (ref 5–15)
BUN: 49 mg/dL — ABNORMAL HIGH (ref 6–20)
CALCIUM: 9.2 mg/dL (ref 8.9–10.3)
CO2: 20 mmol/L — ABNORMAL LOW (ref 22–32)
Chloride: 114 mmol/L — ABNORMAL HIGH (ref 101–111)
Creatinine, Ser: 3.45 mg/dL — ABNORMAL HIGH (ref 0.44–1.00)
GFR calc Af Amer: 13 mL/min — ABNORMAL LOW (ref >60)
GFR calc non Af Amer: 12 mL/min — ABNORMAL LOW (ref >60)
GLUCOSE: 116 mg/dL — AB (ref 65–99)
Potassium: 3.8 mmol/L (ref 3.5–5.1)
SODIUM: 142 mmol/L (ref 135–145)

## 2014-12-22 LAB — CBC
HEMATOCRIT: 27.1 % — AB (ref 36.0–46.0)
HEMOGLOBIN: 8.6 g/dL — AB (ref 12.0–15.0)
MCH: 27.5 pg (ref 26.0–34.0)
MCHC: 31.7 g/dL (ref 30.0–36.0)
MCV: 86.6 fL (ref 78.0–100.0)
Platelets: 155 10*3/uL (ref 150–400)
RBC: 3.13 MIL/uL — ABNORMAL LOW (ref 3.87–5.11)
RDW: 15.5 % (ref 11.5–15.5)
WBC: 6.8 10*3/uL (ref 4.0–10.5)

## 2014-12-22 LAB — CULTURE, BLOOD (ROUTINE X 2)

## 2014-12-22 LAB — GLUCOSE, CAPILLARY: Glucose-Capillary: 76 mg/dL (ref 65–99)

## 2014-12-22 LAB — CG4 I-STAT (LACTIC ACID): LACTIC ACID, VENOUS: 3.3 mmol/L — AB (ref 0.5–2.0)

## 2014-12-22 MED ORDER — CIPROFLOXACIN HCL 500 MG PO TABS: 500.0000 mg | ORAL_TABLET | Freq: Two times a day (BID) | ORAL | Status: DC

## 2014-12-22 MED ORDER — CEFUROXIME AXETIL 500 MG PO TABS
500.0000 mg | ORAL_TABLET | Freq: Two times a day (BID) | ORAL | Status: DC
Start: 2014-12-22 — End: 2014-12-22

## 2014-12-22 MED ORDER — CEFUROXIME AXETIL 500 MG PO TABS: 500.0000 mg | ORAL_TABLET | Freq: Two times a day (BID) | ORAL | Status: DC

## 2014-12-22 MED ORDER — WHITE PETROLATUM GEL
Status: AC
  Administered 2014-12-22: 1
  Filled 2014-12-22: qty 1

## 2014-12-22 NOTE — Progress Notes (Signed)
OT Cancellation Note  Patient Details Name: April Casey MRN: [id number] DOB: [birth year]   Cancelled Treatment:    Reason Eval/Treat Not Completed: OT screened, no needs identified, will sign off. Pt ready to D/C with nursing going over D/C instruction. Per PT note yesterday pt was S 100 feet without AD. Pt reports that today she has been getting herself up to the bathroom without issue and does not foresee any issues with BADLs at home.  Evette GeorgesLeonard, Celso Granja Eva [phone] 12/22/2014, 11:18 AM

## 2014-12-22 NOTE — Discharge Summary (Signed)
Physician Discharge Summary  April Casey MBE:[id number] DOB: 10-Jan-1932 DOA: 12/18/2014  PCP: Vidal Schwalbe, MD  Admit date: 12/18/2014 Discharge date: 12/22/2014  Recommendations for Outpatient Follow-up:  1. Pt will need to follow up with PCP in 2-3 weeks post discharge 2. Please obtain BMP to evaluate electrolytes and kidney function 3. Please also check CBC to evaluate Hg and Hct levels 4. Pt to continue taking Ceftin upon discharge to complete therapy for UTI an bacteremia E. Coli   Discharge Diagnoses:  Principal Problem:   Fever Active Problems:   Gout   Acute renal failure superimposed on stage 3 chronic kidney disease   Diabetes   TIA (transient ischemic attack)   Dyslipidemia   Anemia   HTN (hypertension)   Acute encephalopathy   Protein-calorie malnutrition, severe  Discharge Condition: Stable  Diet recommendation: Heart healthy diet discussed in details   Brief narrative:    81 y.o. female with PMH of hypertension, hyperlipidemia, diabetes mellitus, GERD, hypothyroidism, gout, kidney transportation (on prednisone and tacrolimus), TIA, PVD, chronic kidney disease-stage III, who presented with fever, increased urinary frequency and confusion.  In ED, patient was found to have WBC 10.5, temperature 100.9, tachycardia, worsening renal function, negative chest x-ray, pending urinalysis.   Assessment/Plan:    Sepsis secondary to UTI, g- rods bacteremia likely from UTI - Pt met criteria for sepsis with HR up to 100 and RR up to 24, T 100.76F, source E. Coli UTI - preliminary blood cultures with g- rods and final report still pending  - vancomycin has been discontinued - continued zosyn and based on sensitivity report we have changed it to Ceftin  - sepsis etiology resolved   Acute encephalopathy, metabolic, secondary to UTI  - improved and better overall per family member  - continue Zosyn as noted above   Hypokalemia - supplemented, WNL  Acute renal  failure superimposed on stage 3 chronic kidney disease:  - Likely due to prerenal secondary to dehydration. - Renal US unremarkable - Cr continues trending down   Hx of kidney transplantation:  - Now has CKD-III. 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 Patient is taking Lantus at home - continue Lantus and SSI - continue neurontin as per home medical regimen   TIA (transient ischemic attack) - continue Aspirin  HLD - Last LDL was 31 on 10/24/09 - Continue home medications: Crestor  HTN (hypertension) - reasonable inpatient control - continue Labetalol   Severe PCM - in the context of chronic illness  - appreciate nutritionist assistance   DVT prophylaxis - Heparin SQ  Code Status: Full.  Family Communication: plan of care discussed with the patient and daughter  Disposition Plan: Home   IV access:  Peripheral IV  Procedures and diagnostic studies:   US Renal 12/19/2014 No hydronephrosis within the right upper quadrant for right lower quadrant renal transplant.   Dg Chest Port 1 View 12/18/2014 No active disease.   Medical Consultants:  None  Other Consultants:  Nutritionist PT  IAnti-Infectives:   Vancomycin 7/15>>>7/16 Aztreonam 7/15>>>7/15 Levaquin 7/15>>>7/17 Zosyn 7/15>>> 7/18 changed  to Ceftin upon discharge to complete therapy        Discharge Exam: Filed Vitals:   12/22/14 0820  BP: 144/60  Pulse: 76  Temp: 98.1 F (36.7 C)  Resp: 17   Filed Vitals:   12/21/14 1744 12/21/14 2059 12/22/14 0452  12/22/14 0820  BP: 158/65 155/60 137/67 144/60  Pulse: 87 85 81 76  Temp: 98.1 F (36.7 C) 98.4 F (36.9 C) 97.9 F (36.6 C) 98.1 F (36.7 C)  TempSrc: Oral Oral Oral Oral  Resp: $Remo'18 18 16 17  'ZaVvN$ Height:      Weight:   61.29 kg (135 lb 1.9 oz)   SpO2: 97% 97% 98% 98%    General: Pt is alert, follows commands appropriately, not in acute distress Cardiovascular: Regular rate and rhythm, S1/S2 +, no murmurs, no rubs, no gallops Respiratory: Clear to auscultation bilaterally, no wheezing, no crackles, no rhonchi Abdominal: Soft, non tender, non distended, bowel sounds +, no guarding Extremities: no edema, no cyanosis, pulses palpable bilaterally DP and PT Neuro: Grossly nonfocal  Discharge Instructions  Discharge Instructions    Diet - low sodium heart healthy    Complete by:  As directed      Increase activity slowly    Complete by:  As directed             Medication List    TAKE these medications        acetaminophen 325 MG tablet  Commonly known as:  TYLENOL  Take 650 mg by mouth every 6 (six) hours as needed (pain).     allopurinol 100 MG tablet  Commonly known as:  ZYLOPRIM  Take 100 mg by mouth daily.     aspirin 81 MG tablet  Take 81 mg by mouth daily.     calcitRIOL 0.25 MCG capsule  Commonly known as:  ROCALTROL  Take 0.25 mcg by mouth daily.     cefUROXime 500 MG tablet  Commonly known as:  CEFTIN  Take 1 tablet (500 mg total) by mouth 2 (two) times daily with a meal.     gabapentin 100 MG capsule  Commonly known as:  NEURONTIN  Take 1 capsule (100 mg total) by mouth 3 (three) times daily.     insulin glargine 100 UNIT/ML injection  Commonly known as:  LANTUS  Inject 0.15 mLs (15 Units total) into the skin at bedtime.     labetalol 200 MG tablet  Commonly known as:  NORMODYNE  Take 200 mg by mouth 2 (two) times daily.     levothyroxine 25 MCG tablet  Commonly known as:  SYNTHROID, LEVOTHROID  Take 25 mcg by mouth daily before breakfast.      magnesium oxide 400 MG tablet  Commonly known as:  MAG-OX  Take 400 mg by mouth daily.     omeprazole 20 MG capsule  Commonly known as:  PRILOSEC  Take 20 mg by mouth daily.     predniSONE 5 MG tablet  Commonly known as:  DELTASONE  Take 5 mg by mouth daily with breakfast.     rosuvastatin 20 MG tablet  Commonly known as:  CRESTOR  Take 20 mg by mouth daily.     tacrolimus 1 MG capsule  Commonly known as:  PROGRAF  Take 1 mg by mouth 2 (two) times daily.            Follow-up Information    Follow up with Vidal Schwalbe, MD.   Specialty:  Family Medicine  Contact information:   [address] [place] [address] [phone]       Call Faye Ramsay, MD.   Specialty:  Internal Medicine   Why:  As needed call my cell phone [phone]   Contact information:   [address] [place] [address] [phone]        The results of significant diagnostics from this hospitalization (including imaging, microbiology, ancillary and laboratory) are listed below for reference.     Microbiology: Recent Results (from the past 240 hour(s))  Culture, blood (routine x 2)     Status: None (Preliminary result)   Collection Time: 12/18/14 10:25 PM  Result Value Ref Range Status   Specimen Description BLOOD RIGHT HAND  Final   Special Requests BOTTLES DRAWN AEROBIC ONLY 3CC  Final   Culture  Setup Time   Final    GRAM NEGATIVE RODS AEROBIC BOTTLE ONLY CRITICAL RESULT CALLED TO, READ BACK BY AND VERIFIED WITH: TO LDESHAZO(RN)  BY TCLEVELAND AT 12/20/14 AT 4:51AM    Culture GRAM NEGATIVE RODS  Final   Report Status PENDING  Incomplete  Urine culture     Status: None   Collection Time: 12/18/14 11:24 PM  Result Value Ref Range Status   Specimen Description URINE, RANDOM  Final   Special Requests NONE  Final   Culture >=100,000 COLONIES/mL ESCHERICHIA COLI  Final   Report Status 12/21/2014 FINAL  Final   Organism ID, Bacteria  ESCHERICHIA COLI  Final      Susceptibility   Escherichia coli - MIC*    AMPICILLIN >=32 RESISTANT Resistant     CEFAZOLIN <=4 SENSITIVE Sensitive     CEFTRIAXONE <=1 SENSITIVE Sensitive     CIPROFLOXACIN >=4 RESISTANT Resistant     GENTAMICIN <=1 SENSITIVE Sensitive     IMIPENEM <=0.25 SENSITIVE Sensitive     NITROFURANTOIN <=16 SENSITIVE Sensitive     TRIMETH/SULFA <=20 SENSITIVE Sensitive     AMPICILLIN/SULBACTAM 16 INTERMEDIATE Intermediate     PIP/TAZO <=4 SENSITIVE Sensitive     * >=100,000 COLONIES/mL ESCHERICHIA COLI  Clostridium Difficile by PCR (not at [facility])     Status: None   Collection Time: 12/21/14  4:50 AM  Result Value Ref Range Status   C difficile by pcr NEGATIVE NEGATIVE Final     Labs: Basic Metabolic Panel:  Recent Labs Lab 12/18/14 2205 12/19/14 0835 12/20/14 0451 12/21/14 1516 12/22/14 0548  NA 142 140 140 141 142  K 3.8 3.8 3.1* 4.6 3.8  CL 102 105 104 112* 114*  CO2 $Re'24 23 24 22 'Pqj$ 20*  GLUCOSE 111* 234* 118* 205* 116*  BUN 59* 60* 66* 54* 49*  CREATININE 3.95* 3.93* 4.09* 3.72* 3.45*  CALCIUM 10.5* 9.5 9.6 9.3 9.2   Liver Function Tests:  Recent Labs Lab 12/18/14 2205  AST 24  ALT 13*  ALKPHOS 54  BILITOT 1.2  PROT 7.1  ALBUMIN 3.8   CBC:  Recent Labs Lab 12/18/14 2205 12/19/14 0835 12/20/14 0451 12/21/14 1516 12/22/14 0548  WBC 10.5 11.2* 8.9 6.2 6.8  NEUTROABS 9.2*  --   --   --   --   HGB 12.0 10.2* 9.8* 8.7* 8.6*  HCT 38.8 33.4* 31.6* 28.3* 27.1*  MCV 89.6 89.3 89.8 88.2 86.6  PLT 182 167 147* 158 155   CBG:  Recent Labs Lab 12/21/14 0834 12/21/14 1202 12/21/14 1642 12/21/14 2103 12/22/14 0816  GLUCAP 97 113* 191* 240* 76   SIGNED: Time coordinating discharge:  30 minutes  Faye Ramsay, MD  [facility] 12/22/2014, 9:46 AM Pager [phone]  If 7PM-7AM, please contact night-coverage [email or web address] Password [email or web address]

## 2014-12-22 NOTE — Progress Notes (Signed)
Discharge instructions and medications discussed with patient.  Prescription given to patient.  All questions answered.  

## 2014-12-22 NOTE — Progress Notes (Signed)
ANTIBIOTIC CONSULT NOTE -Follow up  Pharmacy Consult Zosyn > Ceftin Indication: Fever, sepsis, UTI  Allergies  Allergen Reactions  . Amoxicillin     NAUSEATED  . Codeine     VOMITTING    Patient Measurements: Height:  (157.5 cm) Weight: 135 lb 1.9 oz (61.29 kg) IBW/kg (Calculated) : 50.1  Vital Signs: Temp: 98.1 F (36.7 C) (07/18 0820) Temp Source: Oral (07/18 0820) BP: 144/60 mmHg (07/18 0820) Pulse Rate: 76 (07/18 0820)   Medical History: Past Medical History  Diagnosis Date  . Gout   . CKD (chronic kidney disease)   . Diabetes   . TIA (transient ischemic attack)   . Dyslipidemia   . Anemia   . HTN (hypertension)   . PVD (peripheral vascular disease)   . Orthostatic hypotension 06/12/2014    Medications:  Prescriptions prior to admission  Medication Sig Dispense Refill Last Dose  . acetaminophen (TYLENOL) 325 MG tablet Take 650 mg by mouth every 6 (six) hours as needed (pain).   12/17/2014 at Unknown time  . allopurinol (ZYLOPRIM) 100 MG tablet Take 100 mg by mouth daily.   12/17/2014 at Unknown time  . aspirin 81 MG tablet Take 81 mg by mouth daily.   12/17/2014 at Unknown time  . calcitRIOL (ROCALTROL) 0.25 MCG capsule Take 0.25 mcg by mouth daily.   12/17/2014 at Unknown time  . gabapentin (NEURONTIN) 100 MG capsule Take 1 capsule (100 mg total) by mouth 3 (three) times daily. 90 capsule 0 12/17/2014 at Unknown time  . insulin glargine (LANTUS) 100 UNIT/ML injection Inject 0.15 mLs (15 Units total) into the skin at bedtime. 10 mL 11 12/17/2014 at Unknown time  . labetalol (NORMODYNE) 200 MG tablet Take 200 mg by mouth 2 (two) times daily.   5 12/17/2014 at Unknown time  . levothyroxine (SYNTHROID, LEVOTHROID) 25 MCG tablet Take 25 mcg by mouth daily before breakfast.   12/17/2014 at Unknown time  . magnesium oxide (MAG-OX) 400 MG tablet Take 400 mg by mouth daily.   12/17/2014 at Unknown time  . omeprazole (PRILOSEC) 20 MG capsule Take 20 mg by mouth daily.    12/17/2014 at Unknown time  . predniSONE (DELTASONE) 5 MG tablet Take 5 mg by mouth daily with breakfast.   12/17/2014 at Unknown time  . rosuvastatin (CRESTOR) 20 MG tablet Take 20 mg by mouth daily.   12/17/2014 at Unknown time  . tacrolimus (PROGRAF) 1 MG capsule Take 1 mg by mouth 2 (two) times daily.   12/17/2014 at Unknown time   Scheduled:  . allopurinol  100 mg Oral Daily  . aspirin  81 mg Oral Daily  . calcitRIOL  0.25 mcg Oral Daily  . feeding supplement (GLUCERNA SHAKE)  237 mL Oral TID BM  . gabapentin  100 mg Oral TID  . heparin  5,000 Units Subcutaneous 3 times per day  . insulin aspart  0-9 Units Subcutaneous TID WC  . insulin glargine  10 Units Subcutaneous QHS  . labetalol  200 mg Oral BID  . levothyroxine  25 mcg Oral QAC breakfast  . magnesium oxide  400 mg Oral Daily  . pantoprazole  40 mg Oral Daily  . piperacillin-tazobactam (ZOSYN)  IV  2.25 g Intravenous 3 times per day  . predniSONE  5 mg Oral Q breakfast  . rosuvastatin  20 mg Oral Daily  . sodium chloride  3 mL Intravenous Q12H  . tacrolimus  1 mg Oral BID    Assessment: 81yo female  presented on 12/18/14 PM via EMS for AMS and increased urinary frequency, found with leukocytosis and acute on chronic renal failure, concern for sepsis, to begin IV ABX.  H/o kidney transplant. Has Acute renal failure superimposed on stage 3 chronic kidney disease: Likely due to prerenal secondary to dehydration. Dr. Rhona Leavenshiu notes that patient has functional AV fistula, not started dialysis yet. Acute encephalopathy: Likely secondary to infection. Started on Zosyn but will transition to PO abx to complete therapy.  7/14 Urine + E.coli (sens to Zosyn)  7/14 blood cx's + E.coli (sens to Zosyn)   Plan:  d/c Zosyn 2.25g/8h  Initiate Ceftin 500 mg twice daily Monitor CBC and s/sx of infx f/u BMP, renal function (adjust abx as needed)   Sherron MondayAubrey N. Jones, PharmD Clinical Pharmacy Resident Pager: [phone] 12/22/2014 11:10 AM  I have  read the above note and agree.  Tad MooreJessica Karry Barrilleaux, Pharm D, BCPS  Clinical Pharmacist Pager [phone]  12/22/2014 12:42 PM

## 2014-12-22 NOTE — Care Management Important Message (Signed)
Important Message  Patient Details  Name: April Casey MRN: [id number] Date of Birth: [birth year]   Medicare Important Message Given:  Yes-second notification given    Orson AloeMegan P Quenesha Douglass 12/22/2014, 1:59 PM

## 2014-12-22 NOTE — Discharge Instructions (Signed)

## 2014-12-23 LAB — TACROLIMUS LEVEL: TACROLIMUS (FK506) - LABCORP: NOT DETECTED ng/mL (ref 2.0–20.0)

## 2014-12-30 ENCOUNTER — Encounter (HOSPITAL_COMMUNITY): Payer: Self-pay | Admitting: Emergency Medicine

## 2014-12-30 ENCOUNTER — Emergency Department (HOSPITAL_COMMUNITY)
Admission: EM | Admit: 2014-12-30 | Discharge: 2014-12-30 | Disposition: A | Discharge status: Home / Self Care | Payer: Medicare Other | Attending: Emergency Medicine | Admitting: Emergency Medicine

## 2014-12-30 DIAGNOSIS — E119 Type 2 diabetes mellitus without complications: Secondary | ICD-10-CM | POA: Insufficient documentation

## 2014-12-30 DIAGNOSIS — Z794 Long term (current) use of insulin: Secondary | ICD-10-CM | POA: Diagnosis not present

## 2014-12-30 DIAGNOSIS — Z8673 Personal history of transient ischemic attack (TIA), and cerebral infarction without residual deficits: Secondary | ICD-10-CM | POA: Diagnosis not present

## 2014-12-30 DIAGNOSIS — M109 Gout, unspecified: Secondary | ICD-10-CM | POA: Diagnosis not present

## 2014-12-30 DIAGNOSIS — Z7952 Long term (current) use of systemic steroids: Secondary | ICD-10-CM | POA: Diagnosis not present

## 2014-12-30 DIAGNOSIS — N390 Urinary tract infection, site not specified: Secondary | ICD-10-CM | POA: Diagnosis not present

## 2014-12-30 DIAGNOSIS — Z7982 Long term (current) use of aspirin: Secondary | ICD-10-CM | POA: Diagnosis not present

## 2014-12-30 DIAGNOSIS — Z880 Allergy status to penicillin: Secondary | ICD-10-CM | POA: Insufficient documentation

## 2014-12-30 DIAGNOSIS — Z79899 Other long term (current) drug therapy: Secondary | ICD-10-CM | POA: Insufficient documentation

## 2014-12-30 DIAGNOSIS — Z862 Personal history of diseases of the blood and blood-forming organs and certain disorders involving the immune mechanism: Secondary | ICD-10-CM | POA: Insufficient documentation

## 2014-12-30 DIAGNOSIS — N189 Chronic kidney disease, unspecified: Secondary | ICD-10-CM | POA: Diagnosis not present

## 2014-12-30 DIAGNOSIS — I129 Hypertensive chronic kidney disease with stage 1 through stage 4 chronic kidney disease, or unspecified chronic kidney disease: Secondary | ICD-10-CM | POA: Insufficient documentation

## 2014-12-30 DIAGNOSIS — E785 Hyperlipidemia, unspecified: Secondary | ICD-10-CM | POA: Diagnosis not present

## 2014-12-30 DIAGNOSIS — Z940 Kidney transplant status: Secondary | ICD-10-CM | POA: Diagnosis not present

## 2014-12-30 DIAGNOSIS — R531 Weakness: Secondary | ICD-10-CM | POA: Diagnosis present

## 2014-12-30 LAB — URINE MICROSCOPIC-ADD ON

## 2014-12-30 LAB — URINALYSIS, ROUTINE W REFLEX MICROSCOPIC
BILIRUBIN URINE: NEGATIVE
Glucose, UA: NEGATIVE mg/dL
Ketones, ur: NEGATIVE mg/dL
Nitrite: NEGATIVE
Protein, ur: 100 mg/dL — AB
Specific Gravity, Urine: 1.012 (ref 1.005–1.030)
Urobilinogen, UA: 0.2 mg/dL (ref 0.0–1.0)
pH: 5.5 (ref 5.0–8.0)

## 2014-12-30 MED ORDER — CEPHALEXIN 250 MG PO CAPS: 250.0000 mg | ORAL_CAPSULE | Freq: Every day | ORAL | Status: DC

## 2014-12-30 MED ORDER — CEPHALEXIN 250 MG PO CAPS
250.0000 mg | ORAL_CAPSULE | Freq: Once | ORAL | Status: AC
  Administered 2014-12-30: 250 mg via ORAL
  Filled 2014-12-30: qty 1

## 2014-12-30 NOTE — ED Notes (Signed)
Daughter stated, My mother is mad as hell cause i brought her here.  shes a kidney transplant and has a UTI , she was treated but only took her medicine for one day, now I can see the results, she dizzy, stays sleepy.

## 2014-12-30 NOTE — ED Provider Notes (Signed)
CSN: [id number]     Arrival date & time 12/30/14  1442 History   First MD Initiated Contact with Patient 12/30/14 1805     Chief Complaint  Patient presents with  . Urinary Tract Infection  . Dizziness  . Weakness     (Consider location/radiation/quality/duration/timing/severity/associated sxs/prior Treatment) HPI   April Casey is a 81 y.o. female who is here for evaluation of altered mental status, and weakness. She was discharged from the hospital about 10 days ago, after treatment for fever, with documented bacteremia and UTI. The following discharge, she took only one dose of Ceftin but then stopped it for a report from a doctor that she was resistant to it. Looking at the chart is not clear if and when that happened. The patient has done well until the last 2 days when she was somewhat confused, about things such as explaining how to make a telephone call. She has been able to eat. Is been no documented fever. She is taking her usual medications. Patient's daughter is with her and states that she has returned to her baseline, at the time of ED evaluation. There are no other known modifying factors.   Past Medical History  Diagnosis Date  . Gout   . CKD (chronic kidney disease)   . Diabetes   . TIA (transient ischemic attack)   . Dyslipidemia   . Anemia   . HTN (hypertension)   . PVD (peripheral vascular disease)   . Orthostatic hypotension 06/12/2014   Past Surgical History  Procedure Laterality Date  . Breast biopsy    . Tubal ligation    . Kidney transplant    . Av shunt for dialyisis     Family History  Problem Relation Age of Onset  . Hypertension Father   . Stroke Father   . Hypertension Mother   . Stroke Mother   . Diabetes Brother   . Heart attack Brother   . Heart failure Sister   . Diabetes Sister   . Heart attack Brother   . Cancer Brother    History  Substance Use Topics  . Smoking status: Never Smoker   . Smokeless tobacco: Never Used  . Alcohol  Use: No   OB History    No data available     Review of Systems  All other systems reviewed and are negative.     Allergies  Amoxicillin and Codeine  Home Medications   Prior to Admission medications   Medication Sig Start Date End Date Taking? Authorizing Provider  acetaminophen (TYLENOL) 325 MG tablet Take 650 mg by mouth every 6 (six) hours as needed (pain).    Historical Provider, MD  allopurinol (ZYLOPRIM) 100 MG tablet Take 100 mg by mouth daily.    Historical Provider, MD  aspirin 81 MG tablet Take 81 mg by mouth daily.    Historical Provider, MD  calcitRIOL (ROCALTROL) 0.25 MCG capsule Take 0.25 mcg by mouth daily.    Historical Provider, MD  cefUROXime (CEFTIN) 500 MG tablet Take 1 tablet (500 mg total) by mouth 2 (two) times daily with a meal. 12/22/14   Dorothea Ogle, MD  gabapentin (NEURONTIN) 100 MG capsule Take 1 capsule (100 mg total) by mouth 3 (three) times daily. 11/02/14   Jeralyn Bennett, MD  insulin glargine (LANTUS) 100 UNIT/ML injection Inject 0.15 mLs (15 Units total) into the skin at bedtime. 11/02/14   Jeralyn Bennett, MD  labetalol (NORMODYNE) 200 MG tablet Take 200 mg by mouth 2 (  two) times daily.  04/03/14   Historical Provider, MD  levothyroxine (SYNTHROID, LEVOTHROID) 25 MCG tablet Take 25 mcg by mouth daily before breakfast.    Historical Provider, MD  magnesium oxide (MAG-OX) 400 MG tablet Take 400 mg by mouth daily.    Historical Provider, MD  omeprazole (PRILOSEC) 20 MG capsule Take 20 mg by mouth daily.    Historical Provider, MD  predniSONE (DELTASONE) 5 MG tablet Take 5 mg by mouth daily with breakfast.    Historical Provider, MD  rosuvastatin (CRESTOR) 20 MG tablet Take 20 mg by mouth daily.    Historical Provider, MD  tacrolimus (PROGRAF) 1 MG capsule Take 1 mg by mouth 2 (two) times daily.    Historical Provider, MD   BP 142/79 mmHg  Pulse 64  Temp(Src) 98.6 F (37 C) (Oral)  Resp 17  SpO2 99% Physical Exam  Constitutional: She is  oriented to person, place, and time. She appears well-developed. No distress.  Elderly, frail  HENT:  Head: Normocephalic and atraumatic.  Right Ear: External ear normal.  Left Ear: External ear normal.  Eyes: Conjunctivae and EOM are normal. Pupils are equal, round, and reactive to light.  Neck: Normal range of motion and phonation normal. Neck supple.  Cardiovascular: Normal rate, regular rhythm and normal heart sounds.   Pulmonary/Chest: Effort normal and breath sounds normal. She exhibits no bony tenderness.  Abdominal: Soft. There is no tenderness.  Musculoskeletal: Normal range of motion.  Neurological: She is alert and oriented to person, place, and time. No cranial nerve deficit or sensory deficit. She exhibits normal muscle tone. Coordination normal.  No dysarthria, aphasia or nystagmus  Skin: Skin is warm, dry and intact.  Psychiatric: She has a normal mood and affect. Her behavior is normal.  Nursing note and vitals reviewed.   ED Course  Procedures (including critical care time)  Medications  cephALEXin (KEFLEX) capsule 250 mg (250 mg Oral Given 12/30/14 2027)    Patient Vitals for the past 24 hrs:  BP Temp Temp src Pulse Resp SpO2  12/30/14 2028 (!) 132/39 mmHg - - 76 - 100 %  12/30/14 2011 (!) 140/43 mmHg - - 74 20 98 %  12/30/14 1804 142/79 mmHg - - 64 17 99 %  12/30/14 1450 (!) 113/42 mmHg 98.6 F (37 C) Oral 75 20 100 %    Findings discussed with patient and daughter, all questions answered.  Labs Review Labs Reviewed  URINALYSIS, ROUTINE W REFLEX MICROSCOPIC (NOT AT [facility]) - Abnormal; Notable for the following:    APPearance TURBID (*)    Hgb urine dipstick LARGE (*)    Protein, ur 100 (*)    Leukocytes, UA LARGE (*)    All other components within normal limits  URINE MICROSCOPIC-ADD ON - Abnormal; Notable for the following:    Bacteria, UA FEW (*)    All other components within normal limits    Imaging Review No results found.   EKG  Interpretation None      MDM   Final diagnoses:  UTI (lower urinary tract infection)    Nonspecific transient confusion, resolved. Likely urinary tract infection, which is persistent. No clinical indicators for severe bacterial infection, impending vascular collapse or metabolic instability.  Nursing Notes Reviewed/ Care Coordinated Applicable Imaging Reviewed Interpretation of Laboratory Data incorporated into ED treatment  The patient appears reasonably screened and/or stabilized for discharge and I doubt any other medical condition or other [facility] requiring further screening, evaluation, or treatment in the ED  at this time prior to discharge.  Plan: Home Medications- Keflex; Home Treatments- rest; return here if the recommended treatment, does not improve the symptoms; Recommended follow up- PCP 1 week   Prescription for Keflex 250 mg #10 daily, [facility], by me  Mancel Bale, MD 12/30/14 2131

## 2014-12-30 NOTE — ED Notes (Signed)
Pt. Stated, i have no idea why Im here, maybe you can ask my daughter.

## 2014-12-30 NOTE — ED Notes (Signed)
Laughing talking

## 2014-12-30 NOTE — Discharge Instructions (Signed)

## 2014-12-30 NOTE — ED Notes (Signed)
The pt reports that she has told other people dioes she have to repeat it for me.  Apparently she had a   uti 9 days ago.  Still having problems no pain

## 2015-01-01 ENCOUNTER — Other Ambulatory Visit (HOSPITAL_COMMUNITY): Payer: Self-pay | Admitting: *Deleted

## 2015-01-02 ENCOUNTER — Encounter (HOSPITAL_COMMUNITY)
Admission: RE | Admit: 2015-01-02 | Discharge: 2015-01-02 | Disposition: A | Discharge status: Home / Self Care | Payer: BC Managed Care – PPO | Source: Ambulatory Visit | Attending: Nephrology | Admitting: Nephrology

## 2015-01-02 DIAGNOSIS — N184 Chronic kidney disease, stage 4 (severe): Secondary | ICD-10-CM | POA: Diagnosis not present

## 2015-01-02 LAB — POCT HEMOGLOBIN-HEMACUE: HEMOGLOBIN: 10.9 g/dL — AB (ref 12.0–15.0)

## 2015-01-02 MED ORDER — DARBEPOETIN ALFA 200 MCG/0.4ML IJ SOSY
200.0000 ug | PREFILLED_SYRINGE | INTRAMUSCULAR | Status: DC
  Administered 2015-01-02: 200 ug via SUBCUTANEOUS

## 2015-01-02 MED ORDER — DARBEPOETIN ALFA 200 MCG/0.4ML IJ SOSY
PREFILLED_SYRINGE | INTRAMUSCULAR | Status: AC
  Filled 2015-01-02: qty 0.4

## 2015-01-14 ENCOUNTER — Inpatient Hospital Stay (HOSPITAL_COMMUNITY)
Admission: EM | Admit: 2015-01-14 | Discharge: 2015-01-18 | DRG: 871 | Disposition: A | Discharge status: Home / Self Care | Payer: BC Managed Care – PPO | Attending: Internal Medicine | Admitting: Internal Medicine

## 2015-01-14 DIAGNOSIS — D631 Anemia in chronic kidney disease: Secondary | ICD-10-CM | POA: Diagnosis present

## 2015-01-14 DIAGNOSIS — N184 Chronic kidney disease, stage 4 (severe): Secondary | ICD-10-CM | POA: Diagnosis present

## 2015-01-14 DIAGNOSIS — B9620 Unspecified Escherichia coli [E. coli] as the cause of diseases classified elsewhere: Secondary | ICD-10-CM | POA: Diagnosis present

## 2015-01-14 DIAGNOSIS — K5792 Diverticulitis of intestine, part unspecified, without perforation or abscess without bleeding: Secondary | ICD-10-CM | POA: Diagnosis present

## 2015-01-14 DIAGNOSIS — I248 Other forms of acute ischemic heart disease: Secondary | ICD-10-CM | POA: Diagnosis present

## 2015-01-14 DIAGNOSIS — E785 Hyperlipidemia, unspecified: Secondary | ICD-10-CM | POA: Diagnosis present

## 2015-01-14 DIAGNOSIS — I129 Hypertensive chronic kidney disease with stage 1 through stage 4 chronic kidney disease, or unspecified chronic kidney disease: Secondary | ICD-10-CM | POA: Diagnosis present

## 2015-01-14 DIAGNOSIS — W19XXXA Unspecified fall, initial encounter: Secondary | ICD-10-CM

## 2015-01-14 DIAGNOSIS — Z7982 Long term (current) use of aspirin: Secondary | ICD-10-CM

## 2015-01-14 DIAGNOSIS — Z79899 Other long term (current) drug therapy: Secondary | ICD-10-CM

## 2015-01-14 DIAGNOSIS — Z940 Kidney transplant status: Secondary | ICD-10-CM

## 2015-01-14 DIAGNOSIS — G9340 Encephalopathy, unspecified: Secondary | ICD-10-CM

## 2015-01-14 DIAGNOSIS — E039 Hypothyroidism, unspecified: Secondary | ICD-10-CM | POA: Diagnosis present

## 2015-01-14 DIAGNOSIS — R4182 Altered mental status, unspecified: Secondary | ICD-10-CM | POA: Diagnosis not present

## 2015-01-14 DIAGNOSIS — A419 Sepsis, unspecified organism: Secondary | ICD-10-CM | POA: Diagnosis not present

## 2015-01-14 DIAGNOSIS — M109 Gout, unspecified: Secondary | ICD-10-CM | POA: Diagnosis present

## 2015-01-14 DIAGNOSIS — E119 Type 2 diabetes mellitus without complications: Secondary | ICD-10-CM

## 2015-01-14 DIAGNOSIS — E440 Moderate protein-calorie malnutrition: Secondary | ICD-10-CM | POA: Diagnosis present

## 2015-01-14 DIAGNOSIS — Z885 Allergy status to narcotic agent status: Secondary | ICD-10-CM

## 2015-01-14 DIAGNOSIS — Z794 Long term (current) use of insulin: Secondary | ICD-10-CM

## 2015-01-14 DIAGNOSIS — J189 Pneumonia, unspecified organism: Secondary | ICD-10-CM | POA: Diagnosis present

## 2015-01-14 DIAGNOSIS — Z881 Allergy status to other antibiotic agents status: Secondary | ICD-10-CM

## 2015-01-14 DIAGNOSIS — Z8673 Personal history of transient ischemic attack (TIA), and cerebral infarction without residual deficits: Secondary | ICD-10-CM

## 2015-01-14 DIAGNOSIS — I10 Essential (primary) hypertension: Secondary | ICD-10-CM | POA: Diagnosis present

## 2015-01-14 DIAGNOSIS — N390 Urinary tract infection, site not specified: Secondary | ICD-10-CM

## 2015-01-14 HISTORY — DX: Family history of other specified conditions: Z84.89

## 2015-01-14 LAB — CBG MONITORING, ED: Glucose-Capillary: 175 mg/dL — ABNORMAL HIGH (ref 65–99)

## 2015-01-14 MED ORDER — SODIUM CHLORIDE 0.9 % IV BOLUS (SEPSIS)
1000.0000 mL | Freq: Once | INTRAVENOUS | Status: AC
  Administered 2015-01-15: 1000 mL via INTRAVENOUS

## 2015-01-14 MED ORDER — DEXTROSE 5 % IV SOLN
2.0000 g | Freq: Once | INTRAVENOUS | Status: AC
  Administered 2015-01-15: 2 g via INTRAVENOUS
  Filled 2015-01-14: qty 2

## 2015-01-14 MED ORDER — LEVOFLOXACIN IN D5W 750 MG/150ML IV SOLN
750.0000 mg | Freq: Once | INTRAVENOUS | Status: AC
  Administered 2015-01-15: 750 mg via INTRAVENOUS
  Filled 2015-01-14: qty 150

## 2015-01-14 MED ORDER — VANCOMYCIN HCL IN DEXTROSE 1-5 GM/200ML-% IV SOLN: 1000.0000 mg | Freq: Once | INTRAVENOUS | Status: DC

## 2015-01-14 NOTE — ED Notes (Signed)
Per EMS:  Pt's family found pt unresponsive at her home this morning (approx 9am) and had to break the door down.  Pt was taken to a friend's house (EMS not called).  Pt's mental status decreased throughout the day and pt is now barely responsive to voice.  Pt able to identify herself by name.  Pt is restricted on the left arm for Dialysis.  Pt also taking narcotic pain medicines.  Pupils are reactive.  Pt had an episode like this approx 2 weeks ago.  Family sts they feel she is not taking her meds at the same time every day (BP meds, etc) and pt may be over taking her pain medicines.

## 2015-01-14 NOTE — ED Provider Notes (Addendum)
CSN: [id number]     Arrival date & time 01/14/15  2248 History   This chart was scribed for April Crumble, MD by Freida Busman, ED Scribe. This patient was seen in room D33C/D33C and the patient's care was started 11:19 PM.    Chief Complaint  Patient presents with  . Altered Mental Status   LEVEL 5 CAVEAT DUE TO Acuity of Condition    The history is provided by a relative (Daughter). No language interpreter was used.    HPI Comments:  April Casey is a 81 y.o. female with a PMHx of DM, TIA and HTN who presents to the Emergency Department with daughter s/p fall today. Per daughter pt was found by a family member on the floor of the kitchen between 12p and 1p today. Daughter states pt was alert at the time but pt's mental status has declined throughout the day. Daughter reports subsequent bladder incontinence and decreased responsiveness. Daughter denies recent cough. No alleviating factors noted. Pt recently completed 10 day course of antibiotics for UTI; daughter unsure if symptoms improved.   Past Medical History  Diagnosis Date  . Gout   . CKD (chronic kidney disease)   . Diabetes   . TIA (transient ischemic attack)   . Dyslipidemia   . Anemia   . HTN (hypertension)   . PVD (peripheral vascular disease)   . Orthostatic hypotension 06/12/2014   Past Surgical History  Procedure Laterality Date  . Breast biopsy    . Tubal ligation    . Kidney transplant    . Av shunt for dialyisis     Family History  Problem Relation Age of Onset  . Hypertension Father   . Stroke Father   . Hypertension Mother   . Stroke Mother   . Diabetes Brother   . Heart attack Brother   . Heart failure Sister   . Diabetes Sister   . Heart attack Brother   . Cancer Brother    Social History  Substance Use Topics  . Smoking status: Never Smoker   . Smokeless tobacco: Never Used  . Alcohol Use: No   OB History    No data available     Review of Systems  Unable to perform ROS: Acuity of  condition    A complete 10 system review of systems was obtained and all systems are negative except as noted in the HPI and PMH.    Allergies  Amoxicillin and Codeine  Home Medications   Prior to Admission medications   Medication Sig Start Date End Date Taking? Authorizing Provider  acetaminophen (TYLENOL) 325 MG tablet Take 650 mg by mouth every 6 (six) hours as needed (pain).    Historical Provider, MD  allopurinol (ZYLOPRIM) 100 MG tablet Take 100 mg by mouth daily.    Historical Provider, MD  aspirin 81 MG tablet Take 81 mg by mouth daily.    Historical Provider, MD  calcitRIOL (ROCALTROL) 0.25 MCG capsule Take 0.25 mcg by mouth daily.    Historical Provider, MD  cefUROXime (CEFTIN) 500 MG tablet Take 1 tablet (500 mg total) by mouth 2 (two) times daily with a meal. 12/22/14   Dorothea Ogle, MD  cephALEXin (KEFLEX) 250 MG capsule Take 1 capsule (250 mg total) by mouth daily. 12/30/14   Mancel Bale, MD  gabapentin (NEURONTIN) 100 MG capsule Take 1 capsule (100 mg total) by mouth 3 (three) times daily. 11/02/14   Jeralyn Bennett, MD  insulin glargine (LANTUS) 100 UNIT/ML injection  Inject 0.15 mLs (15 Units total) into the skin at bedtime. 11/02/14   Jeralyn Bennett, MD  labetalol (NORMODYNE) 200 MG tablet Take 200 mg by mouth 2 (two) times daily.  04/03/14   Historical Provider, MD  levothyroxine (SYNTHROID, LEVOTHROID) 25 MCG tablet Take 25 mcg by mouth daily before breakfast.    Historical Provider, MD  magnesium oxide (MAG-OX) 400 MG tablet Take 400 mg by mouth daily.    Historical Provider, MD  omeprazole (PRILOSEC) 20 MG capsule Take 20 mg by mouth daily.    Historical Provider, MD  predniSONE (DELTASONE) 5 MG tablet Take 5 mg by mouth daily with breakfast.    Historical Provider, MD  rosuvastatin (CRESTOR) 20 MG tablet Take 20 mg by mouth daily.    Historical Provider, MD  tacrolimus (PROGRAF) 1 MG capsule Take 1 mg by mouth 2 (two) times daily.    Historical Provider, MD   BP  100/40 mmHg  Pulse 81  Temp(Src) 100.1 F (37.8 C) (Oral)  Resp 20  Ht 5\' 3"  (1.6 m)  Wt 135 lb (61.236 kg)  BMI 23.92 kg/m2  SpO2 94% Physical Exam  Constitutional: She appears well-developed and well-nourished. No distress.  HENT:  Head: Normocephalic and atraumatic.  Nose: Nose normal.  Mouth/Throat: Oropharynx is clear and moist. No oropharyngeal exudate.  Eyes: Conjunctivae and EOM are normal. Pupils are equal, round, and reactive to light. No scleral icterus.  Neck: Normal range of motion. Neck supple. No JVD present. No tracheal deviation present. No thyromegaly present.  Cardiovascular: Normal rate, regular rhythm and normal heart sounds.  Exam reveals no gallop and no friction rub.   No murmur heard. Pulmonary/Chest: Effort normal and breath sounds normal. No respiratory distress. She has no wheezes. She exhibits no tenderness.  Abdominal: Soft. Bowel sounds are normal. She exhibits no distension and no mass. There is no tenderness. There is no rebound and no guarding.  Musculoskeletal: Normal range of motion. She exhibits edema. She exhibits no tenderness.  Lymphadenopathy:    She has no cervical adenopathy.  Neurological: She exhibits normal muscle tone.  Minimal response to physical stimuli Does not follow commands  Skin: Skin is warm and dry. No rash noted. No erythema. No pallor.  Ecchy RLQ abd  Nursing note and vitals reviewed.   ED Course  Procedures   DIAGNOSTIC STUDIES:  Oxygen Saturation is 94% on RA, normal by my interpretation.    COORDINATION OF CARE:  11:26 PM Angiocath insertion Performed by: April Casey  Consent: Verbal consent obtained. Risks and benefits: risks, benefits and alternatives were discussed Time out: Immediately prior to procedure a "time out" was called to verify the correct patient, procedure, equipment, support staff and site/side marked as required.  Preparation: Patient was prepped and draped in the usual sterile  fashion.  Vein Location: R brachial  Ultrasound Guided  Gauge: 20G  Normal blood return and flush without difficulty Patient tolerance: Patient tolerated the procedure well with no immediate complications.    11:33 PM Discussed treatment plan with daughter at bedside and she agreed to plan.  Labs Review Labs Reviewed  COMPREHENSIVE METABOLIC PANEL - Abnormal; Notable for the following:    Potassium 5.3 (*)    Chloride 98 (*)    Glucose, Bld 175 (*)    BUN 65 (*)    Creatinine, Ser 4.49 (*)    Calcium 10.4 (*)    Albumin 3.1 (*)    ALT 12 (*)    GFR calc non Af [place]  8 (*)    GFR calc Af Amer 10 (*)    All other components within normal limits  LIPASE, BLOOD - Abnormal; Notable for the following:    Lipase 18 (*)    All other components within normal limits  PROTIME-INR - Abnormal; Notable for the following:    Prothrombin Time 15.8 (*)    All other components within normal limits  URINALYSIS, ROUTINE W REFLEX MICROSCOPIC (NOT AT [facility]) - Abnormal; Notable for the following:    Color, Urine AMBER (*)    APPearance CLOUDY (*)    Hgb urine dipstick LARGE (*)    Protein, ur >300 (*)    Leukocytes, UA MODERATE (*)    All other components within normal limits  URINE MICROSCOPIC-ADD ON - Abnormal; Notable for the following:    Bacteria, UA MANY (*)    All other components within normal limits  I-STAT CG4 LACTIC ACID, ED - Abnormal; Notable for the following:    Lactic Acid, Venous 3.21 (*)    All other components within normal limits  CBG MONITORING, ED - Abnormal; Notable for the following:    Glucose-Capillary 175 (*)    All other components within normal limits  I-STAT TROPOININ, ED - Abnormal; Notable for the following:    Troponin i, poc 0.20 (*)    All other components within normal limits  I-STAT CHEM 8, ED - Abnormal; Notable for the following:    Chloride 98 (*)    BUN 65 (*)    Creatinine, Ser 4.30 (*)    Glucose, Bld 174 (*)    All other components within  normal limits  URINE CULTURE  CULTURE, BLOOD (ROUTINE X 2)  CULTURE, BLOOD (ROUTINE X 2)  CBC WITH DIFFERENTIAL/PLATELET  CBC WITH DIFFERENTIAL/PLATELET  I-STAT CG4 LACTIC ACID, ED  I-STAT CG4 LACTIC ACID, ED    Imaging Review No results found.   EKG Interpretation   Date/Time:  Wednesday January 14 2015 23:00:59 EDT Ventricular Rate:  81 PR Interval:  154 QRS Duration: 73 QT Interval:  369 QTC Calculation: 428 R Axis:   -26 Text Interpretation:  Sinus rhythm LVH with secondary repolarization  abnormality Anterior Q waves, possibly due to LVH No significant change  since last tracing Confirmed by KNAPP  MD-J, JON ([id number]) on 01/14/2015  11:19:45 PM      MDM   Final diagnoses:  Altered mental state    Patient presents emergency department for altered mental status. Is likely due to sepsis as the patient has fever, hypotension and recently was diagnosed with a UTI. Family states she completed a 10 day course of antibiotics.  IV access was urgently obtained. Patient was given 2 L of IV fluid as well as bilateral inspection and for treatment. His elevated to 3.2, troponin is 0.2 (likely demand ischemia versus end-stage renal disease).  White blood cell count, urinalysis, chest x-ray are currently pending. I spoke with Dr. Teddy Spike will try hospitalist admit the patient to step down unit for further management. He is also requesting CT scan of the head which was ordered. Urinalysis reveals an infection.  CRITICAL CARE Performed by: April Casey   Total critical care time: 35 min - sepsis  Critical care time was exclusive of separately billable procedures and treating other patients.  Critical care was necessary to treat or prevent imminent or life-threatening deterioration.  Critical care was time spent personally by me on the following activities: development of treatment plan with patient and/or surrogate as well as nursing,  discussions with consultants, evaluation of  patient's response to treatment, examination of patient, obtaining history from patient or surrogate, ordering and performing treatments and interventions, ordering and review of laboratory studies, ordering and review of radiographic studies, pulse oximetry and re-evaluation of patient's condition.   I personally performed the services described in this documentation, which was scribed in my presence. The recorded information has been reviewed and is accurate.     April Crumble, MD 01/15/15 [phone]

## 2015-01-15 ENCOUNTER — Inpatient Hospital Stay (HOSPITAL_COMMUNITY): Payer: BC Managed Care – PPO

## 2015-01-15 ENCOUNTER — Encounter (HOSPITAL_COMMUNITY): Payer: Self-pay

## 2015-01-15 ENCOUNTER — Emergency Department (HOSPITAL_COMMUNITY): Payer: BC Managed Care – PPO

## 2015-01-15 DIAGNOSIS — R4182 Altered mental status, unspecified: Secondary | ICD-10-CM | POA: Diagnosis present

## 2015-01-15 DIAGNOSIS — A4151 Sepsis due to Escherichia coli [E. coli]: Secondary | ICD-10-CM | POA: Diagnosis not present

## 2015-01-15 DIAGNOSIS — D631 Anemia in chronic kidney disease: Secondary | ICD-10-CM | POA: Diagnosis present

## 2015-01-15 DIAGNOSIS — A419 Sepsis, unspecified organism: Secondary | ICD-10-CM | POA: Diagnosis not present

## 2015-01-15 DIAGNOSIS — Z794 Long term (current) use of insulin: Secondary | ICD-10-CM | POA: Diagnosis not present

## 2015-01-15 DIAGNOSIS — Z7982 Long term (current) use of aspirin: Secondary | ICD-10-CM | POA: Diagnosis not present

## 2015-01-15 DIAGNOSIS — Z79899 Other long term (current) drug therapy: Secondary | ICD-10-CM | POA: Diagnosis not present

## 2015-01-15 DIAGNOSIS — G9340 Encephalopathy, unspecified: Secondary | ICD-10-CM | POA: Diagnosis present

## 2015-01-15 DIAGNOSIS — M109 Gout, unspecified: Secondary | ICD-10-CM | POA: Diagnosis present

## 2015-01-15 DIAGNOSIS — Z8673 Personal history of transient ischemic attack (TIA), and cerebral infarction without residual deficits: Secondary | ICD-10-CM | POA: Diagnosis not present

## 2015-01-15 DIAGNOSIS — Z885 Allergy status to narcotic agent status: Secondary | ICD-10-CM | POA: Diagnosis not present

## 2015-01-15 DIAGNOSIS — J189 Pneumonia, unspecified organism: Secondary | ICD-10-CM | POA: Diagnosis present

## 2015-01-15 DIAGNOSIS — N390 Urinary tract infection, site not specified: Secondary | ICD-10-CM | POA: Diagnosis present

## 2015-01-15 DIAGNOSIS — E039 Hypothyroidism, unspecified: Secondary | ICD-10-CM | POA: Diagnosis present

## 2015-01-15 DIAGNOSIS — Z881 Allergy status to other antibiotic agents status: Secondary | ICD-10-CM | POA: Diagnosis not present

## 2015-01-15 DIAGNOSIS — B9620 Unspecified Escherichia coli [E. coli] as the cause of diseases classified elsewhere: Secondary | ICD-10-CM | POA: Diagnosis present

## 2015-01-15 DIAGNOSIS — I129 Hypertensive chronic kidney disease with stage 1 through stage 4 chronic kidney disease, or unspecified chronic kidney disease: Secondary | ICD-10-CM | POA: Diagnosis present

## 2015-01-15 DIAGNOSIS — E440 Moderate protein-calorie malnutrition: Secondary | ICD-10-CM | POA: Diagnosis present

## 2015-01-15 DIAGNOSIS — I10 Essential (primary) hypertension: Secondary | ICD-10-CM | POA: Diagnosis not present

## 2015-01-15 DIAGNOSIS — Z940 Kidney transplant status: Secondary | ICD-10-CM | POA: Diagnosis not present

## 2015-01-15 DIAGNOSIS — E785 Hyperlipidemia, unspecified: Secondary | ICD-10-CM | POA: Diagnosis present

## 2015-01-15 DIAGNOSIS — K5792 Diverticulitis of intestine, part unspecified, without perforation or abscess without bleeding: Secondary | ICD-10-CM | POA: Diagnosis present

## 2015-01-15 DIAGNOSIS — N184 Chronic kidney disease, stage 4 (severe): Secondary | ICD-10-CM | POA: Diagnosis present

## 2015-01-15 DIAGNOSIS — E119 Type 2 diabetes mellitus without complications: Secondary | ICD-10-CM | POA: Diagnosis not present

## 2015-01-15 DIAGNOSIS — I248 Other forms of acute ischemic heart disease: Secondary | ICD-10-CM | POA: Diagnosis present

## 2015-01-15 LAB — CBC WITH DIFFERENTIAL/PLATELET
BASOS ABS: 0 10*3/uL (ref 0.0–0.1)
Basophils Absolute: 0 10*3/uL (ref 0.0–0.1)
Basophils Relative: 0 % (ref 0–1)
Basophils Relative: 0 % (ref 0–1)
EOS ABS: 0.2 10*3/uL (ref 0.0–0.7)
EOS PCT: 1 % (ref 0–5)
Eosinophils Absolute: 0 10*3/uL (ref 0.0–0.7)
Eosinophils Relative: 0 % (ref 0–5)
HCT: 37.1 % (ref 36.0–46.0)
HEMATOCRIT: 28.6 % — AB (ref 36.0–46.0)
Hemoglobin: 11.4 g/dL — ABNORMAL LOW (ref 12.0–15.0)
Hemoglobin: 8.8 g/dL — ABNORMAL LOW (ref 12.0–15.0)
LYMPHS PCT: 6 % — AB (ref 12–46)
Lymphocytes Relative: 8 % — ABNORMAL LOW (ref 12–46)
Lymphs Abs: 1.2 10*3/uL (ref 0.7–4.0)
Lymphs Abs: 0.8 10*3/uL (ref 0.7–4.0)
MCH: 28.5 pg (ref 26.0–34.0)
MCH: 28.4 pg (ref 26.0–34.0)
MCHC: 30.7 g/dL (ref 30.0–36.0)
MCHC: 30.8 g/dL (ref 30.0–36.0)
MCV: 92.8 fL (ref 78.0–100.0)
MCV: 92.3 fL (ref 78.0–100.0)
MONO ABS: 0.8 10*3/uL (ref 0.1–1.0)
Monocytes Absolute: 0.9 10*3/uL (ref 0.1–1.0)
Monocytes Relative: 6 % (ref 3–12)
Monocytes Relative: 6 % (ref 3–12)
NEUTROS PCT: 85 % — AB (ref 43–77)
Neutro Abs: 12.8 10*3/uL — ABNORMAL HIGH (ref 1.7–7.7)
Neutro Abs: 10.7 10*3/uL — ABNORMAL HIGH (ref 1.7–7.7)
Neutrophils Relative %: 87 % — ABNORMAL HIGH (ref 43–77)
Platelets: UNDETERMINED 10*3/uL (ref 150–400)
Platelets: 146 10*3/uL — ABNORMAL LOW (ref 150–400)
RBC: 4 MIL/uL (ref 3.87–5.11)
RBC: 3.1 MIL/uL — ABNORMAL LOW (ref 3.87–5.11)
RDW: 17 % — ABNORMAL HIGH (ref 11.5–15.5)
RDW: 17 % — ABNORMAL HIGH (ref 11.5–15.5)
WBC: 15.1 10*3/uL — ABNORMAL HIGH (ref 4.0–10.5)
WBC: 12.3 10*3/uL — AB (ref 4.0–10.5)

## 2015-01-15 LAB — I-STAT CG4 LACTIC ACID, ED
LACTIC ACID, VENOUS: 3.21 mmol/L — AB (ref 0.5–2.0)
Lactic Acid, Venous: 1.32 mmol/L (ref 0.5–2.0)

## 2015-01-15 LAB — I-STAT TROPONIN, ED
Troponin i, poc: 0.17 ng/mL (ref 0.00–0.08)
Troponin i, poc: 0.2 ng/mL (ref 0.00–0.08)

## 2015-01-15 LAB — I-STAT CHEM 8, ED
BUN: 65 mg/dL — ABNORMAL HIGH (ref 6–20)
Calcium, Ion: 1.27 mmol/L (ref 1.13–1.30)
Chloride: 98 mmol/L — ABNORMAL LOW (ref 101–111)
Creatinine, Ser: 4.3 mg/dL — ABNORMAL HIGH (ref 0.44–1.00)
Glucose, Bld: 174 mg/dL — ABNORMAL HIGH (ref 65–99)
HCT: 37 % (ref 36.0–46.0)
Hemoglobin: 12.6 g/dL (ref 12.0–15.0)
Potassium: 5.1 mmol/L (ref 3.5–5.1)
Sodium: 136 mmol/L (ref 135–145)
TCO2: 27 mmol/L (ref 0–100)

## 2015-01-15 LAB — PROTIME-INR
INR: 1.25 (ref 0.00–1.49)
Prothrombin Time: 15.8 seconds — ABNORMAL HIGH (ref 11.6–15.2)

## 2015-01-15 LAB — COMPREHENSIVE METABOLIC PANEL
ALBUMIN: 3.1 g/dL — AB (ref 3.5–5.0)
ALT: 9 U/L — ABNORMAL LOW (ref 14–54)
ALT: 12 U/L — AB (ref 14–54)
ANION GAP: 14 (ref 5–15)
AST: 25 U/L (ref 15–41)
AST: 20 U/L (ref 15–41)
Albumin: 2.3 g/dL — ABNORMAL LOW (ref 3.5–5.0)
Alkaline Phosphatase: 61 U/L (ref 38–126)
Alkaline Phosphatase: 44 U/L (ref 38–126)
Anion gap: 10 (ref 5–15)
BILIRUBIN TOTAL: 1 mg/dL (ref 0.3–1.2)
BUN: 65 mg/dL — ABNORMAL HIGH (ref 6–20)
BUN: 65 mg/dL — ABNORMAL HIGH (ref 6–20)
CALCIUM: 8.9 mg/dL (ref 8.9–10.3)
CALCIUM: 10.4 mg/dL — AB (ref 8.9–10.3)
CHLORIDE: 104 mmol/L (ref 101–111)
CO2: 26 mmol/L (ref 22–32)
CO2: 25 mmol/L (ref 22–32)
CREATININE: 4.56 mg/dL — AB (ref 0.44–1.00)
Chloride: 98 mmol/L — ABNORMAL LOW (ref 101–111)
Creatinine, Ser: 4.49 mg/dL — ABNORMAL HIGH (ref 0.44–1.00)
GFR calc non Af Amer: 8 mL/min — ABNORMAL LOW (ref >60)
GFR calc non Af Amer: 8 mL/min — ABNORMAL LOW (ref >60)
GFR, EST AFRICAN AMERICAN: 10 mL/min — AB (ref >60)
GFR, EST AFRICAN AMERICAN: 10 mL/min — AB (ref >60)
GLUCOSE: 126 mg/dL — AB (ref 65–99)
GLUCOSE: 175 mg/dL — AB (ref 65–99)
Potassium: 5.3 mmol/L — ABNORMAL HIGH (ref 3.5–5.1)
Potassium: 5 mmol/L (ref 3.5–5.1)
Sodium: 138 mmol/L (ref 135–145)
Sodium: 139 mmol/L (ref 135–145)
TOTAL PROTEIN: 6.5 g/dL (ref 6.5–8.1)
Total Bilirubin: 0.7 mg/dL (ref 0.3–1.2)
Total Protein: 4.8 g/dL — ABNORMAL LOW (ref 6.5–8.1)

## 2015-01-15 LAB — PROCALCITONIN: Procalcitonin: 23.84 ng/mL

## 2015-01-15 LAB — URINE MICROSCOPIC-ADD ON

## 2015-01-15 LAB — CBG MONITORING, ED
GLUCOSE-CAPILLARY: 152 mg/dL — AB (ref 65–99)
Glucose-Capillary: 177 mg/dL — ABNORMAL HIGH (ref 65–99)

## 2015-01-15 LAB — URINALYSIS, ROUTINE W REFLEX MICROSCOPIC
Bilirubin Urine: NEGATIVE
Glucose, UA: NEGATIVE mg/dL
Ketones, ur: NEGATIVE mg/dL
NITRITE: NEGATIVE
PH: 5.5 (ref 5.0–8.0)
Protein, ur: >300 mg/dL — AB
SPECIFIC GRAVITY, URINE: 1.015 (ref 1.005–1.030)
UROBILINOGEN UA: 0.2 mg/dL (ref 0.0–1.0)

## 2015-01-15 LAB — LACTIC ACID, PLASMA
LACTIC ACID, VENOUS: 1.1 mmol/L (ref 0.5–2.0)
Lactic Acid, Venous: 1.6 mmol/L (ref 0.5–2.0)

## 2015-01-15 LAB — GLUCOSE, CAPILLARY
Glucose-Capillary: 228 mg/dL — ABNORMAL HIGH (ref 65–99)
Glucose-Capillary: 261 mg/dL — ABNORMAL HIGH (ref 65–99)

## 2015-01-15 LAB — APTT: aPTT: 32 seconds (ref 24–37)

## 2015-01-15 LAB — LIPASE, BLOOD: Lipase: 18 U/L — ABNORMAL LOW (ref 22–51)

## 2015-01-15 MED ORDER — MORPHINE SULFATE 2 MG/ML IJ SOLN
2.0000 mg | Freq: Once | INTRAMUSCULAR | Status: AC
  Administered 2015-01-15: 2 mg via INTRAVENOUS
  Filled 2015-01-15: qty 1

## 2015-01-15 MED ORDER — TACROLIMUS 1 MG PO CAPS
1.0000 mg | ORAL_CAPSULE | Freq: Two times a day (BID) | ORAL | Status: DC
  Administered 2015-01-15 – 2015-01-18 (×7): 1 mg via ORAL
  Filled 2015-01-15 (×9): qty 1

## 2015-01-15 MED ORDER — ROSUVASTATIN CALCIUM 20 MG PO TABS
20.0000 mg | ORAL_TABLET | Freq: Every day | ORAL | Status: DC
  Administered 2015-01-15 – 2015-01-18 (×4): 20 mg via ORAL
  Filled 2015-01-15 (×5): qty 1

## 2015-01-15 MED ORDER — HYDROCORTISONE SOD SUCCINATE 100 MG PF FOR IT USE: 50.0000 mg | Freq: Three times a day (TID) | INTRAMUSCULAR | Status: DC

## 2015-01-15 MED ORDER — VANCOMYCIN HCL IN DEXTROSE 1-5 GM/200ML-% IV SOLN
1000.0000 mg | INTRAVENOUS | Status: DC
  Filled 2015-01-15: qty 200

## 2015-01-15 MED ORDER — HYDROCORTISONE NA SUCCINATE PF 100 MG IJ SOLR
50.0000 mg | Freq: Once | INTRAMUSCULAR | Status: AC
  Administered 2015-01-15: 50 mg via INTRAVENOUS
  Filled 2015-01-15: qty 2

## 2015-01-15 MED ORDER — ALLOPURINOL 100 MG PO TABS
100.0000 mg | ORAL_TABLET | Freq: Every day | ORAL | Status: DC
  Administered 2015-01-15 – 2015-01-18 (×4): 100 mg via ORAL
  Filled 2015-01-15 (×5): qty 1

## 2015-01-15 MED ORDER — MAGNESIUM OXIDE 400 (241.3 MG) MG PO TABS
400.0000 mg | ORAL_TABLET | Freq: Every day | ORAL | Status: DC
Start: 2015-01-15 — End: 2015-01-18
  Administered 2015-01-15 – 2015-01-18 (×4): 400 mg via ORAL
  Filled 2015-01-15 (×5): qty 1

## 2015-01-15 MED ORDER — PANTOPRAZOLE SODIUM 40 MG PO TBEC
40.0000 mg | DELAYED_RELEASE_TABLET | Freq: Every day | ORAL | Status: DC
  Administered 2015-01-15 – 2015-01-18 (×4): 40 mg via ORAL
  Filled 2015-01-15 (×5): qty 1

## 2015-01-15 MED ORDER — CALCITRIOL 0.25 MCG PO CAPS
0.2500 ug | ORAL_CAPSULE | Freq: Every day | ORAL | Status: DC
  Administered 2015-01-15 – 2015-01-18 (×4): 0.25 ug via ORAL
  Filled 2015-01-15 (×4): qty 1

## 2015-01-15 MED ORDER — HYDROCORTISONE NA SUCCINATE PF 100 MG IJ SOLR
50.0000 mg | Freq: Three times a day (TID) | INTRAMUSCULAR | Status: DC
Start: 2015-01-15 — End: 2015-01-15

## 2015-01-15 MED ORDER — ONDANSETRON HCL 4 MG PO TABS: 4.0000 mg | ORAL_TABLET | Freq: Four times a day (QID) | ORAL | Status: DC | PRN

## 2015-01-15 MED ORDER — INSULIN ASPART 100 UNIT/ML ~~LOC~~ SOLN
0.0000 [IU] | Freq: Three times a day (TID) | SUBCUTANEOUS | Status: DC
  Administered 2015-01-15 – 2015-01-16 (×2): 3 [IU] via SUBCUTANEOUS
  Administered 2015-01-16: 2 [IU] via SUBCUTANEOUS
  Administered 2015-01-16: 4 [IU] via SUBCUTANEOUS
  Administered 2015-01-17 (×2): 1 [IU] via SUBCUTANEOUS
  Filled 2015-01-15 (×2): qty 1

## 2015-01-15 MED ORDER — SODIUM CHLORIDE 0.9 % IV SOLN
1250.0000 mg | Freq: Once | INTRAVENOUS | Status: AC
  Administered 2015-01-15: 1250 mg via INTRAVENOUS
  Filled 2015-01-15: qty 1250

## 2015-01-15 MED ORDER — LEVOTHYROXINE SODIUM 25 MCG PO TABS
25.0000 ug | ORAL_TABLET | Freq: Every day | ORAL | Status: DC
  Administered 2015-01-15 – 2015-01-18 (×4): 25 ug via ORAL
  Filled 2015-01-15 (×6): qty 1

## 2015-01-15 MED ORDER — ACETAMINOPHEN 650 MG RE SUPP
975.0000 mg | Freq: Once | RECTAL | Status: AC
  Administered 2015-01-15: 975 mg via RECTAL
  Filled 2015-01-15 (×2): qty 1

## 2015-01-15 MED ORDER — PREDNISONE 5 MG PO TABS
5.0000 mg | ORAL_TABLET | Freq: Every day | ORAL | Status: DC
  Filled 2015-01-15 (×2): qty 1

## 2015-01-15 MED ORDER — GABAPENTIN 100 MG PO CAPS
100.0000 mg | ORAL_CAPSULE | Freq: Three times a day (TID) | ORAL | Status: DC
  Administered 2015-01-15 – 2015-01-17 (×3): 100 mg via ORAL
  Filled 2015-01-15 (×11): qty 1

## 2015-01-15 MED ORDER — HYDROCORTISONE NA SUCCINATE PF 100 MG IJ SOLR
50.0000 mg | Freq: Three times a day (TID) | INTRAMUSCULAR | Status: DC
  Administered 2015-01-15 – 2015-01-16 (×3): 50 mg via INTRAVENOUS
  Filled 2015-01-15 (×4): qty 2

## 2015-01-15 MED ORDER — ACETAMINOPHEN 500 MG PO TABS: 1000.0000 mg | ORAL_TABLET | Freq: Once | ORAL | Status: DC

## 2015-01-15 MED ORDER — HYDROCORTISONE SOD SUCCINATE 100 MG PF FOR IT USE: 50.0000 mg | Freq: Once | INTRAMUSCULAR | Status: DC

## 2015-01-15 MED ORDER — TAB-A-VITE/IRON PO TABS
1.0000 | ORAL_TABLET | Freq: Every day | ORAL | Status: DC
  Administered 2015-01-15 – 2015-01-18 (×4): 1 via ORAL
  Filled 2015-01-15 (×5): qty 1

## 2015-01-15 MED ORDER — ACETAMINOPHEN 325 MG PO TABS: 650.0000 mg | ORAL_TABLET | Freq: Four times a day (QID) | ORAL | Status: DC | PRN

## 2015-01-15 MED ORDER — INSULIN GLARGINE 100 UNIT/ML ~~LOC~~ SOLN
15.0000 [IU] | Freq: Every day | SUBCUTANEOUS | Status: DC
  Administered 2015-01-15 – 2015-01-17 (×3): 15 [IU] via SUBCUTANEOUS
  Filled 2015-01-15 (×7): qty 0.15

## 2015-01-15 MED ORDER — ONDANSETRON HCL 4 MG/2ML IJ SOLN: 4.0000 mg | Freq: Four times a day (QID) | INTRAMUSCULAR | Status: DC | PRN

## 2015-01-15 MED ORDER — ASPIRIN EC 81 MG PO TBEC
81.0000 mg | DELAYED_RELEASE_TABLET | Freq: Every day | ORAL | Status: DC
  Administered 2015-01-15 – 2015-01-18 (×4): 81 mg via ORAL
  Filled 2015-01-15 (×5): qty 1

## 2015-01-15 MED ORDER — ACETAMINOPHEN 650 MG RE SUPP
650.0000 mg | Freq: Four times a day (QID) | RECTAL | Status: DC | PRN
Start: 2015-01-15 — End: 2015-01-18

## 2015-01-15 MED ORDER — PIPERACILLIN-TAZOBACTAM IN DEX 2-0.25 GM/50ML IV SOLN
2.2500 g | Freq: Three times a day (TID) | INTRAVENOUS | Status: DC
  Administered 2015-01-15 – 2015-01-18 (×10): 2.25 g via INTRAVENOUS
  Filled 2015-01-15 (×15): qty 50

## 2015-01-15 MED ORDER — SODIUM CHLORIDE 0.9 % IV SOLN
INTRAVENOUS | Status: DC
  Administered 2015-01-15: 09:00:00 via INTRAVENOUS
  Administered 2015-01-16: 1000 mL via INTRAVENOUS

## 2015-01-15 MED ORDER — LEVOFLOXACIN IN D5W 500 MG/100ML IV SOLN: 500.0000 mg | INTRAVENOUS | Status: DC

## 2015-01-15 MED ORDER — DEXTROSE 5 % IV SOLN
500.0000 mg | Freq: Three times a day (TID) | INTRAVENOUS | Status: DC
  Filled 2015-01-15: qty 0.5

## 2015-01-15 MED ORDER — ENOXAPARIN SODIUM 30 MG/0.3ML ~~LOC~~ SOLN
30.0000 mg | Freq: Every day | SUBCUTANEOUS | Status: DC
  Administered 2015-01-15 – 2015-01-18 (×4): 30 mg via SUBCUTANEOUS
  Filled 2015-01-15 (×5): qty 0.3

## 2015-01-15 MED ORDER — HYDROCORTISONE SOD SUCCINATE 100 MG PF FOR IT USE
50.0000 mg | Freq: Three times a day (TID) | INTRAMUSCULAR | Status: DC
  Filled 2015-01-15 (×2): qty 0.5

## 2015-01-15 NOTE — ED Notes (Signed)
Heart healthy diet ordered. 

## 2015-01-15 NOTE — ED Notes (Signed)
Lab called and sts CBC does not have enough blood to run.  Made them aware of difficulty obtaining.  Will put in additional order if able to collect.

## 2015-01-15 NOTE — ED Notes (Signed)
called micro, only 1 set of blood cultures has been drawn.

## 2015-01-15 NOTE — ED Notes (Signed)
Iv team at bedside attempting second IV.

## 2015-01-15 NOTE — ED Notes (Addendum)
Pt attempting to roll herself over in bed.  Pt found twice now attempting to roll on stomach.  Pt seems aggitated/anxious.  Pt attempting to pull herself out of the bed, but not fully aware of RN's attempts to soothe.  Pt denies pain.  States she is comfortable but continues to pull herself about in the bed and twitch her legs, as if she is in pain.  Pt continues to state "oh lordy, oh lordy", but when asked if she is okay, pt sts yes.  MD made aware

## 2015-01-15 NOTE — Progress Notes (Signed)
PATIENT DETAILS Name: April Casey Age: 81 y.o. Sex: female Date of Birth: 09/21/1931 Admit Date: 01/14/2015 Admitting Physician Eduard Clos, MD ZOX:[id number],[id number] S, MD  Subjective: Awake and alert-family members at bedside. Seems to have improved significantly and then on initial presentation  Assessment/Plan: Active Problems: Acute encephalopathy: Secondary to UTI with sepsis. CT head negative for acute abnormalities. Significantly improved with IV antibiotics. Follow  UTI with sepsis: Improving with IV fluids and IV antibiotics. Follow cultures. Encephalopathy has significantly improved  Acute on chronic kidney disease stage IV with history of renal transplant: Mild worsening of creatinine, suspect likely secondary to prerenal azotemia in the setting of sepsis/UTI. Continue IV fluids and recheck electrolytes in a.m. Per family and patient-She does not want to be placed back on hemodialysis if and when kidney failure becomes ESRD  History of renal transplant: Continue Prograf and steroids-taper hydrocortisone rapidly in the next few days to usual dosing of prednisone  Type 2 diabetes: CBGs stable, continue 15 units of Lantus and SSI.  Hypothyroidism: Continue with levothyroxine  Dyslipidemia: Continue with statin  Hypertension: BP soft on admission-antihypertensives currently on hold-resume in the next few days  Disposition: Remain inpatient  Antimicrobial agents  See below  Anti-infectives    Start     Dose/Rate Route Frequency Ordered Stop   01/17/15 0000  vancomycin (VANCOCIN) IVPB 1000 mg/200 mL premix     1,000 mg 200 mL/hr over 60 Minutes Intravenous Every 48 hours 01/15/15 0025     01/16/15 2300  levofloxacin (LEVAQUIN) IVPB 500 mg  Status:  Discontinued     500 mg 100 mL/hr over 60 Minutes Intravenous Every 48 hours 01/15/15 0025 01/15/15 0554   01/15/15 1000  aztreonam (AZACTAM) 500 mg in dextrose 5 % 50 mL IVPB  Status:   Discontinued     500 mg 100 mL/hr over 30 Minutes Intravenous 3 times per day 01/15/15 0025 01/15/15 0606   01/15/15 0700  piperacillin-tazobactam (ZOSYN) IVPB 2.25 g     2.25 g 100 mL/hr over 30 Minutes Intravenous 3 times per day 01/15/15 0607     01/15/15 0030  vancomycin (VANCOCIN) 1,250 mg in sodium chloride 0.9 % 250 mL IVPB     1,250 mg 166.7 mL/hr over 90 Minutes Intravenous  Once 01/15/15 0021 01/15/15 0420   01/14/15 2345  levofloxacin (LEVAQUIN) IVPB 750 mg     750 mg 100 mL/hr over 90 Minutes Intravenous  Once 01/14/15 2335 01/15/15 0248   01/14/15 2345  aztreonam (AZACTAM) 2 g in dextrose 5 % 50 mL IVPB     2 g 100 mL/hr over 30 Minutes Intravenous  Once 01/14/15 2335 01/15/15 0212   01/14/15 2345  vancomycin (VANCOCIN) IVPB 1000 mg/200 mL premix  Status:  Discontinued     1,000 mg 200 mL/hr over 60 Minutes Intravenous  Once 01/14/15 2335 01/15/15 0021      DVT Prophylaxis: Prophylactic Lovenox  Code Status: Full code   Family Communication Daughter and granddaughter at bedside  Procedures: nONE  CONSULTS:  None  Time spent 40 minutes-Greater than 50% of this time was spent in counseling, explanation of diagnosis, planning of further management, and coordination of care.  MEDICATIONS: Scheduled Meds: . allopurinol  100 mg Oral Daily  . aspirin EC  81 mg Oral Daily  . calcitRIOL  0.25 mcg Oral Daily  . enoxaparin (LOVENOX) injection  30 mg Subcutaneous Daily  . gabapentin  100 mg Oral TID  . hydrocortisone sod succinate (SOLU-CORTEF) inj  50 mg Intravenous Q8H  . insulin aspart  0-9 Units Subcutaneous TID WC  . insulin glargine  15 Units Subcutaneous QHS  . levothyroxine  25 mcg Oral QAC breakfast  . magnesium oxide  400 mg Oral Daily  . pantoprazole  40 mg Oral Daily  . piperacillin-tazobactam (ZOSYN)  IV  2.25 g Intravenous 3 times per day  . rosuvastatin  20 mg Oral Daily  . tacrolimus  1 mg Oral BID  . [START ON 01/17/2015] vancomycin  1,000 mg  Intravenous Q48H   Continuous Infusions: . sodium chloride 100 mL/hr at 01/15/15 0837   PRN Meds:.acetaminophen **OR** acetaminophen, ondansetron **OR** ondansetron (ZOFRAN) IV    PHYSICAL EXAM: Vital signs in last 24 hours: Filed Vitals:   01/15/15 1230 01/15/15 1245 01/15/15 1300 01/15/15 1315  BP: 116/48 118/51 112/51 120/54  Pulse: 72 72 72 73  Temp:      TempSrc:      Resp:      Height:      Weight:      SpO2: 97% 97% 98% 98%    Weight change:  Filed Weights   01/14/15 2257  Weight: 61.236 kg (135 lb)   Body mass index is 23.92 kg/(m^2).   Gen Exam: Awake and alert with clear speech.   Neck: Supple, No JVD.   Chest: B/L Clear.   CVS: S1 S2 Regular, no murmurs.  Abdomen: soft, BS +, non tender, non distended.  Extremities: no edema, lower extremities warm to touch. Neurologic: Non Focal.   Skin: No Rash.   Wounds: N/A.   Intake/Output from previous day:  Intake/Output Summary (Last 24 hours) at 01/15/15 1409 Last data filed at 01/15/15 0420  Gross per 24 hour  Intake   2500 ml  Output    100 ml  Net   2400 ml     LAB RESULTS: CBC  Recent Labs Lab 01/14/15 2358 01/15/15 0154 01/15/15 0630  WBC  --  15.1* 12.3*  HGB 12.6 11.4* 8.8*  HCT 37.0 37.1 28.6*  PLT  --  PLATELET CLUMPS NOTED ON SMEAR, UNABLE TO ESTIMATE 146*  MCV  --  92.8 92.3  MCH  --  28.5 28.4  MCHC  --  30.7 30.8  RDW  --  17.0* 17.0*  LYMPHSABS  --  1.2 0.8  MONOABS  --  0.9 0.8  EOSABS  --  0.2 0.0  BASOSABS  --  0.0 0.0    Chemistries   Recent Labs Lab 01/14/15 2353 01/14/15 2358 01/15/15 0630  NA 138 136 139  K 5.3* 5.1 5.0  CL 98* 98* 104  CO2 26  --  25  GLUCOSE 175* 174* 126*  BUN 65* 65* 65*  CREATININE 4.49* 4.30* 4.56*  CALCIUM 10.4*  --  8.9    CBG:  Recent Labs Lab 01/14/15 2311 01/15/15 0853 01/15/15 1153  GLUCAP 175* 152* 177*    GFR Estimated Creatinine Clearance: 8.1 mL/min (by C-G formula based on Cr of 4.56).  Coagulation  profile  Recent Labs Lab 01/15/15 0055  INR 1.25    Cardiac Enzymes No results for input(s): CKMB, TROPONINI, MYOGLOBIN in the last 168 hours.  Invalid input(s): CK  Invalid input(s): POCBNP No results for input(s): DDIMER in the last 72 hours. No results for input(s): HGBA1C in the last 72 hours. No results for input(s): CHOL, HDL, LDLCALC, TRIG, CHOLHDL, LDLDIRECT in the last 72 hours. No  results for input(s): TSH, T4TOTAL, T3FREE, THYROIDAB in the last 72 hours.  Invalid input(s): FREET3 No results for input(s): VITAMINB12, FOLATE, FERRITIN, TIBC, IRON, RETICCTPCT in the last 72 hours.  Recent Labs  01/14/15 2353  LIPASE 18*    Urine Studies No results for input(s): UHGB, CRYS in the last 72 hours.  Invalid input(s): UACOL, UAPR, USPG, UPH, UTP, UGL, UKET, UBIL, UNIT, UROB, ULEU, UEPI, UWBC, URBC, UBAC, CAST, UCOM, BILUA  MICROBIOLOGY: No results found for this or any previous visit (from the past 240 hour(s)).  RADIOLOGY STUDIES/RESULTS: Dg Chest 2 View  01/15/2015   CLINICAL DATA:  Patient found unresponsive.  Initial encounter.  EXAM: CHEST  2 VIEW  COMPARISON:  Chest radiograph performed 12/18/2014  FINDINGS: The lungs are hypoexpanded. Vascular crowding and vascular congestion are seen. Left basilar airspace opacity may reflect atelectasis or pneumonia. No pleural effusion or pneumothorax is seen.  The heart is borderline normal in size. No acute osseous abnormalities are seen. Diffuse calcification is seen along the abdominal aorta.  IMPRESSION: Lungs hypoexpanded. Vascular congestion noted. Left basilar airspace opacity may reflect atelectasis or pneumonia.   Electronically Signed   By: Roanna Raider M.D.   On: 01/15/2015 02:12   Dg Pelvis 1-2 Views  01/15/2015   CLINICAL DATA:  Fall, confusion  EXAM: PELVIS - 1-2 VIEW  COMPARISON:  None.  FINDINGS: Single frontal view of the pelvis submitted. Diffuse osteopenia. No acute fracture or subluxation. Atherosclerotic  calcifications of iliac and femoral arteries.  IMPRESSION: Negative.   Electronically Signed   By: Natasha Mead M.D.   On: 01/15/2015 08:28   Ct Head Wo Contrast  01/15/2015   CLINICAL DATA:  Patient found unresponsive. Acute encephalopathy. Initial encounter.  EXAM: CT HEAD WITHOUT CONTRAST  TECHNIQUE: Contiguous axial images were obtained from the base of the skull through the vertex without intravenous contrast.  COMPARISON:  CT of the head performed 01/11/2010  FINDINGS: There is no evidence of acute infarction, mass lesion, or intra- or extra-axial hemorrhage on CT.  Scattered periventricular and subcortical white matter change likely reflects small vessel ischemic microangiopathy. Chronic lacunar infarcts are seen at the left basal ganglia. A chronic lacunar infarct is noted at the right cerebellar hemisphere.  The posterior fossa, including the cerebellum, brainstem and fourth ventricle, is within normal limits. The third and lateral ventricles, and basal ganglia are unremarkable in appearance. The cerebral hemispheres are symmetric in appearance, with normal gray-white differentiation. No mass effect or midline shift is seen.  There is no evidence of fracture; visualized osseous structures are unremarkable in appearance. The orbits are within normal limits. There is opacification of the left maxillary sinus. The remaining paranasal sinuses and mastoid air cells are well-aerated. No significant soft tissue abnormalities are seen.  IMPRESSION: 1. No acute intracranial pathology seen on CT. 2. Scattered small vessel ischemic microangiopathy. 3. Chronic lacunar infarcts at the left basal ganglia, and chronic lacunar infarct at the right cerebellar hemisphere. 4. Opacification of the left maxillary sinus.   Electronically Signed   By: Roanna Raider M.D.   On: 01/15/2015 05:20   US Renal  12/19/2014   CLINICAL DATA:  Patient with worsening renal function.  EXAM: RENAL / URINARY TRACT ULTRASOUND COMPLETE   COMPARISON:  None.  FINDINGS: Right Kidney:  Length: 5.4 cm. Atrophic. No hydronephrosis. Increased renal cortical echogenicity. Sub cm hypoechoic lesion, too small to characterize.  Left Kidney:  Length: 7.5 cm. Atrophic. Increased renal cortical echogenicity. No hydronephrosis.  Bladder:  Appears normal for degree of bladder distention.  Right upper quadrant renal transplant: Normal renal cortical thickness and echogenicity. No hydronephrosis.  Right lower quadrant renal transplant: Normal renal cortical thickness and echogenicity. No hydronephrosis.  IMPRESSION: No hydronephrosis within the right upper quadrant for right lower quadrant renal transplant.  Atrophic native kidneys.  Consider dedicated renal transplant Doppler evaluation as clinically indicated.   Electronically Signed   By: Annia Belt M.D.   On: 12/19/2014 11:34   Dg Chest Port 1 View  12/18/2014   CLINICAL DATA:  81 year old female with shortness of breath  EXAM: PORTABLE CHEST - 1 VIEW  COMPARISON:  Radiograph dated 01/31/2011  FINDINGS: The heart size and mediastinal contours are within normal limits. Both lungs are clear. The visualized skeletal structures are unremarkable.  IMPRESSION: No active disease.   Electronically Signed   By: Elgie Collard M.D.   On: 12/18/2014 23:03    Jeoffrey Massed, MD  [facility] Pager:[phone]  If 7PM-7AM, please contact night-coverage [email or web address] Password [email or web address] 01/15/2015, 2:09 PM   LOS: 0 days

## 2015-01-15 NOTE — ED Notes (Signed)
Patient transported to CT 

## 2015-01-15 NOTE — H&P (Signed)
[facility] History and Physical  April Casey UJW:[id number] DOB: 12-13-1931 DOA: 01/14/2015  Referring physician: Dr.Oni. PCP: Cala Bradford, MD  Specialists: Dr.Dunham  Chief Complaint: Altered mental status.  History obtained from patient's daughter.  HPI: April Casey is a 81 y.o. female history of renal transplant, chronic kidney disease, diabetes mellitus, hypertension, hypothyroidism, peripheral vascular disease was brought to the ER after patient was found to be increasingly confused. As per patient daughter patient was not answering her phone calls last evening around 4 PM when patient's nephew went and checked on her and she was found to be on the floor. EMS was called and patient refused to come to the ER. Patient later was taken to a neighbors house across the street and over the patient's son found that patient was increasingly confused and lethargic. Patient was brought to the ER patient was found to be febrile with elevated lactic acid levels. UA showing features consistent with UTI and chest x-ray showing possible infiltrates. As per patient's daughter patient did not have any shortness of breath chest pain nausea vomiting diarrhea. On my exam patient is confused but follows commands. Denies any chest pain or abdominal pain.   Review of Systems: As presented in the history of presenting illness, rest negative.  Past Medical History  Diagnosis Date  . Gout   . CKD (chronic kidney disease)   . Diabetes   . TIA (transient ischemic attack)   . Dyslipidemia   . Anemia   . HTN (hypertension)   . PVD (peripheral vascular disease)   . Orthostatic hypotension 06/12/2014   Past Surgical History  Procedure Laterality Date  . Breast biopsy    . Tubal ligation    . Kidney transplant    . Av shunt for dialyisis     Social History:  reports that she has never smoked. She has never used smokeless tobacco. She reports that she does not drink alcohol or use illicit  drugs. Where does patient live home. Can patient participate in ADLs? Yes.  Allergies  Allergen Reactions  . Amoxicillin     NAUSEATED  . Codeine     VOMITTING    Family History:  Family History  Problem Relation Age of Onset  . Hypertension Father   . Stroke Father   . Hypertension Mother   . Stroke Mother   . Diabetes Brother   . Heart attack Brother   . Heart failure Sister   . Diabetes Sister   . Heart attack Brother   . Cancer Brother       Prior to Admission medications   Medication Sig Start Date End Date Taking? Authorizing Provider  acetaminophen (TYLENOL) 325 MG tablet Take 650 mg by mouth every 6 (six) hours as needed (pain).   Yes Historical Provider, MD  gabapentin (NEURONTIN) 100 MG capsule Take 1 capsule (100 mg total) by mouth 3 (three) times daily. 11/02/14  Yes Jeralyn Bennett, MD  insulin glargine (LANTUS) 100 UNIT/ML injection Inject 0.15 mLs (15 Units total) into the skin at bedtime. 11/02/14  Yes Jeralyn Bennett, MD  labetalol (NORMODYNE) 200 MG tablet Take 200 mg by mouth 2 (two) times daily.  04/03/14  Yes Historical Provider, MD  tacrolimus (PROGRAF) 1 MG capsule Take 1 mg by mouth 2 (two) times daily.   Yes Historical Provider, MD  allopurinol (ZYLOPRIM) 100 MG tablet Take 100 mg by mouth daily.    Historical Provider, MD  aspirin 81 MG tablet Take 81 mg by mouth  daily.    Historical Provider, MD  calcitRIOL (ROCALTROL) 0.25 MCG capsule Take 0.25 mcg by mouth daily.    Historical Provider, MD  cefUROXime (CEFTIN) 500 MG tablet Take 1 tablet (500 mg total) by mouth 2 (two) times daily with a meal. Patient not taking: Reported on 01/15/2015 12/22/14   Dorothea Ogle, MD  cephALEXin (KEFLEX) 250 MG capsule Take 1 capsule (250 mg total) by mouth daily. Patient not taking: Reported on 01/15/2015 12/30/14   Mancel Bale, MD  levothyroxine (SYNTHROID, LEVOTHROID) 25 MCG tablet Take 25 mcg by mouth daily before breakfast.    Historical Provider, MD  magnesium  oxide (MAG-OX) 400 MG tablet Take 400 mg by mouth daily.    Historical Provider, MD  omeprazole (PRILOSEC) 20 MG capsule Take 20 mg by mouth daily.    Historical Provider, MD  predniSONE (DELTASONE) 5 MG tablet Take 5 mg by mouth daily with breakfast.    Historical Provider, MD  rosuvastatin (CRESTOR) 20 MG tablet Take 20 mg by mouth daily.    Historical Provider, MD    Physical Exam: Filed Vitals:   01/15/15 0315 01/15/15 0330 01/15/15 0415 01/15/15 0524  BP: 109/72 118/44 114/40   Pulse: 89 84 80   Temp:    98.7 F (37.1 C)  TempSrc:    Rectal  Resp: 25 18 16    Height:      Weight:      SpO2: 98% 94% 94%      General:  Moderately built and poorly nourished.  Eyes: Anicteric no pallor.  ENT: No discharge from the ears eyes nose and mouth.  Neck: No mass felt. No neck rigidity.  Cardiovascular: S1-S2 heard.  Respiratory: No rhonchi or crepitations.  Abdomen: Soft nontender bowel sounds present.  Skin: No rash.  Musculoskeletal: No edema.  Psychiatric: Appears confused.  Neurologic: Mildly lethargic and oriented to her name only. Moves all extremities.  Labs on Admission:  Basic Metabolic Panel:  Recent Labs Lab 01/14/15 2353 01/14/15 2358  NA 138 136  K 5.3* 5.1  CL 98* 98*  CO2 26  --   GLUCOSE 175* 174*  BUN 65* 65*  CREATININE 4.49* 4.30*  CALCIUM 10.4*  --    Liver Function Tests:  Recent Labs Lab 01/14/15 2353  AST 25  ALT 12*  ALKPHOS 61  BILITOT 0.7  PROT 6.5  ALBUMIN 3.1*    Recent Labs Lab 01/14/15 2353  LIPASE 18*   No results for input(s): AMMONIA in the last 168 hours. CBC:  Recent Labs Lab 01/14/15 2358 01/15/15 0154  WBC  --  15.1*  NEUTROABS  --  12.8*  HGB 12.6 11.4*  HCT 37.0 37.1  MCV  --  92.8  PLT  --  PLATELET CLUMPS NOTED ON SMEAR, UNABLE TO ESTIMATE   Cardiac Enzymes: No results for input(s): CKTOTAL, CKMB, CKMBINDEX, TROPONINI in the last 168 hours.  BNP (last 3 results) No results for input(s): BNP  in the last 8760 hours.  ProBNP (last 3 results) No results for input(s): PROBNP in the last 8760 hours.  CBG:  Recent Labs Lab 01/14/15 2311  GLUCAP 175*    Radiological Exams on Admission: Dg Chest 2 View  01/15/2015   CLINICAL DATA:  Patient found unresponsive.  Initial encounter.  EXAM: CHEST  2 VIEW  COMPARISON:  Chest radiograph performed 12/18/2014  FINDINGS: The lungs are hypoexpanded. Vascular crowding and vascular congestion are seen. Left basilar airspace opacity may reflect atelectasis or pneumonia. No pleural  effusion or pneumothorax is seen.  The heart is borderline normal in size. No acute osseous abnormalities are seen. Diffuse calcification is seen along the abdominal aorta.  IMPRESSION: Lungs hypoexpanded. Vascular congestion noted. Left basilar airspace opacity may reflect atelectasis or pneumonia.   Electronically Signed   By: Roanna Raider M.D.   On: 01/15/2015 02:12   Ct Head Wo Contrast  01/15/2015   CLINICAL DATA:  Patient found unresponsive. Acute encephalopathy. Initial encounter.  EXAM: CT HEAD WITHOUT CONTRAST  TECHNIQUE: Contiguous axial images were obtained from the base of the skull through the vertex without intravenous contrast.  COMPARISON:  CT of the head performed 01/11/2010  FINDINGS: There is no evidence of acute infarction, mass lesion, or intra- or extra-axial hemorrhage on CT.  Scattered periventricular and subcortical white matter change likely reflects small vessel ischemic microangiopathy. Chronic lacunar infarcts are seen at the left basal ganglia. A chronic lacunar infarct is noted at the right cerebellar hemisphere.  The posterior fossa, including the cerebellum, brainstem and fourth ventricle, is within normal limits. The third and lateral ventricles, and basal ganglia are unremarkable in appearance. The cerebral hemispheres are symmetric in appearance, with normal gray-white differentiation. No mass effect or midline shift is seen.  There is no  evidence of fracture; visualized osseous structures are unremarkable in appearance. The orbits are within normal limits. There is opacification of the left maxillary sinus. The remaining paranasal sinuses and mastoid air cells are well-aerated. No significant soft tissue abnormalities are seen.  IMPRESSION: 1. No acute intracranial pathology seen on CT. 2. Scattered small vessel ischemic microangiopathy. 3. Chronic lacunar infarcts at the left basal ganglia, and chronic lacunar infarct at the right cerebellar hemisphere. 4. Opacification of the left maxillary sinus.   Electronically Signed   By: Roanna Raider M.D.   On: 01/15/2015 05:20    EKG: Independently reviewed. Normal sinus rhythm with poor R-wave progression.  Assessment/Plan Active Problems:   HTN (hypertension)   Acute encephalopathy   Diabetes mellitus without complication   Sepsis   UTI (lower urinary tract infection)   1. Sepsis - most likely source UTI with also possible pneumonia and sinusitis. Patient has been placed on sepsis protocol. Continue with hydration and caution the patient also has chronic kidney disease. Patient is on vancomycin and Zosyn. Follow cultures and lactic acid and for calcitonin levels. 2. Acute encephalopathy most likely from sepsis - closely observe. 3. Chronic kidney disease with renal transplant - I have placed patient on stress dose steroids. Closely observe metabolic panel and intake output. 4. Diverticulitis mellitus type II on Lantus insulin which will be continued. 5. Hypertension - for now we will hold off antihypertensives and I have placed patient on when necessary IV hydralazine. 6. Hyperlipidemia on statins. 7. Hypothyroidism on Synthroid.  I have reviewed the patient's old charts and labs and personally reviewed x-rays and EKG.   DVT Prophylaxis Lovenox.  Code Status: Full code.  Family Communication: Patient's daughter.  Disposition Plan: Admit to inpatient.    Dewaine Morocho  N. [facility] Pager [phone].  If 7PM-7AM, please contact night-coverage [email or web address] Password [facility] 01/15/2015, 5:28 AM

## 2015-01-15 NOTE — Progress Notes (Signed)
Assumed care of pt from Adrienne. Pt states no complaints no distress. Resting-watching TV. 

## 2015-01-15 NOTE — ED Notes (Signed)
Pharmacy to send medications.

## 2015-01-15 NOTE — Clinical Documentation Improvement (Signed)
MD's, NP's, and PA's   Documentation of "CKD" in patient with h/o Renal transplant.  Please provide stage of CKD?  Thank you   Possible Clinical Conditions?   CKD Stage I - GFR > OR = 90 CKD Stage II - GFR 60-80 CKD Stage III - GFR 30-59 CKD Stage IV - GFR 15-29 CKD Stage V - GFR < 15  Other condition Cannot Clinically determine     Risk Factors: Pneumonia, Sepsis, Encephalopathy, Demand Ischemia, h/o renal transplant  Diagnostics:  BUN 6 - 20 mg/dL 65 (H) 65 (H) 65 (H) 49 (H)    Creatinine, Ser 0.44 - 1.00 mg/dL 1.61 (H) 0.96 (H) 0.45 (H         GFR calc Af Amer >60 mL/min 10 (L)  60 mL/min" class="[email or web address]" style="cursor: pointer; background-color: rgb(228, 223, 214);" onmouseover='jscript: var varStyle="underline"; var bgColor="#[id number]"; this.style.backgroundColor=bgColor; var children=this.getElementsByTagName("div"); for(var child=0;child 10 (L)CM          Treatment: Evaluated/Monitored  Thank You, April Casey ,RN Clinical Documentation Specialist:  [phone]  [facility]- Health Information Management

## 2015-01-15 NOTE — ED Notes (Signed)
EMT at bedside to monitor pt.

## 2015-01-15 NOTE — Progress Notes (Signed)
Initial Nutrition Assessment  DOCUMENTATION CODES:   Non-severe (moderate) malnutrition in context of chronic illness  INTERVENTION:   -Magic Cup TID -MVI daily  NUTRITION DIAGNOSIS:   Malnutrition related to chronic illness as evidenced by mild depletion of muscle mass, mild depletion of body fat.  GOAL:   Patient will meet greater than or equal to 90% of their needs  MONITOR:   PO intake, Supplement acceptance, Labs, Weight trends, Skin, I & O's  REASON FOR ASSESSMENT:   Consult Assessment of nutrition requirement/status  ASSESSMENT:   April Casey is a 81 y.o. female history of renal transplant, chronic kidney disease, diabetes mellitus, hypertension, hypothyroidism, peripheral vascular disease was brought to the ER after patient was found to be increasingly confused. As per patient daughter patient was not answering her phone calls last evening around 4 PM when patient's nephew went and checked on her and she was found to be on the floor. EMS was called and patient refused to come to the ER. Patient later was taken to a neighbors house across the street and over the patient's son found that patient was increasingly confused and lethargic. Patient was brought to the ER patient was found to be febrile with elevated lactic acid levels. UA showing features consistent with UTI and chest x-ray showing possible infiltrates. As per patient's daughter patient did not have any shortness of breath chest pain nausea vomiting diarrhea. On my exam patient is confused but follows commands. Denies any chest pain or abdominal pain.   Pt admitted with sepsis related to UTI and acute encephalopathy.   Hx obtained by pt and pt daughter at bedside. Both reports hx of ongoing wt loss and poor appetite. Per pt daughter UBW is 165#. She reveals pt started losing weight around 1 year ago and estimated a 30# (18.2%) since that time frame. Documented wt hx reveals a 13.5% wt loss over the past 6 months.    Pt reports appetite is decreased because "everything tastes nasty". She denies any difficulty chewing or swallowing foods, however, food does not taste right. Pt daughter added that she was offered some applesauce and a few other food items in the ER, however, pt was unable to consume any of these items.   Pt endorses that she used to drink Boost at home, but no longer does, as the milky-type supplements give her diarrhea. Spent the majority of visit discussing other supplement options, including Boost Breeze, Ensure, and Prostat, however, pt refused them all ("I just can't do it"). Pt was amenable to trying Magic Cup with extensive amount of coaxing from this RD and daughter.   Nutrition-Focused physical exam completed. Findings are mild to moderate fat depletion, mild to moderate muscle depletion, and no edema.   Discussed importance of good PO intake to promote healing.  Diet Order:  Diet heart healthy/carb modified Room service appropriate?: Yes; Fluid consistency:: Thin  Skin:  Reviewed, no issues  Last BM:  PTA  Height:   Ht Readings from Last 1 Encounters:  01/14/15  (1.6 m)    Weight:   Wt Readings from Last 1 Encounters:  01/14/15 135 lb (61.236 kg)    Ideal Body Weight:  52.3 kg  BMI:  Body mass index is 23.92 kg/(m^2).  Estimated Nutritional Needs:   Kcal:  1650-1850  Protein:  80-95 grams  Fluid:  >1.6 L  EDUCATION NEEDS:   Education needs addressed  Kurk Corniel A. Mayford Knife, RD, LDN, CDE Pager: [phone] After hours Pager: [phone]

## 2015-01-15 NOTE — ED Notes (Signed)
CBG = 177  Brett Canales RN informed of result.

## 2015-01-15 NOTE — Progress Notes (Addendum)
ANTIBIOTIC CONSULT NOTE - INITIAL  Pharmacy Consult for Levaquin, Aztreonam, and Vancomycin ->Vancomycin and Zosyn Indication: sepsis  Allergies  Allergen Reactions  . Amoxicillin     NAUSEATED  . Codeine     VOMITTING    Patient Measurements: Height: 5\' 3"  (160 cm) Weight: 135 lb (61.236 kg) IBW/kg (Calculated) : 52.4  Vital Signs: Temp: 100.1 F (37.8 C) (08/10 2257) Temp Source: Oral (08/10 2257) BP: 99/47 mmHg (08/10 2300) Pulse Rate: 80 (08/10 2300) Intake/Output from previous day:   Intake/Output from this shift:    Labs:  Recent Labs  01/14/15 2358  HGB 12.6  CREATININE 4.30*   Estimated Creatinine Clearance: 8.6 mL/min (by C-G formula based on Cr of 4.3). No results for input(s): VANCOTROUGH, VANCOPEAK, VANCORANDOM, GENTTROUGH, GENTPEAK, GENTRANDOM, TOBRATROUGH, TOBRAPEAK, TOBRARND, AMIKACINPEAK, AMIKACINTROU, AMIKACIN in the last 72 hours.   Microbiology: Recent Results (from the past 720 hour(s))  Culture, blood (routine x 2)     Status: None   Collection Time: 12/18/14 10:25 PM  Result Value Ref Range Status   Specimen Description BLOOD RIGHT HAND  Final   Special Requests BOTTLES DRAWN AEROBIC ONLY 3CC  Final   Culture  Setup Time   Final    GRAM NEGATIVE RODS AEROBIC BOTTLE ONLY CRITICAL RESULT CALLED TO, READ BACK BY AND VERIFIED WITH: TO LDESHAZO(RN)  BY TCLEVELAND AT 12/20/14 AT 4:51AM    Culture ESCHERICHIA COLI  Final   Report Status 12/22/2014 FINAL  Final   Organism ID, Bacteria ESCHERICHIA COLI  Final      Susceptibility   Escherichia coli - MIC*    AMPICILLIN >=32 RESISTANT Resistant     CEFAZOLIN <=4 SENSITIVE Sensitive     CEFEPIME <=1 SENSITIVE Sensitive     CEFTAZIDIME <=1 SENSITIVE Sensitive     CEFTRIAXONE <=1 SENSITIVE Sensitive     CIPROFLOXACIN >=4 RESISTANT Resistant     GENTAMICIN <=1 SENSITIVE Sensitive     IMIPENEM <=0.25 SENSITIVE Sensitive     TRIMETH/SULFA <=20 SENSITIVE Sensitive     AMPICILLIN/SULBACTAM >=32  RESISTANT Resistant     PIP/TAZO <=4 SENSITIVE Sensitive     * ESCHERICHIA COLI  Urine culture     Status: None   Collection Time: 12/18/14 11:24 PM  Result Value Ref Range Status   Specimen Description URINE, RANDOM  Final   Special Requests NONE  Final   Culture >=100,000 COLONIES/mL ESCHERICHIA COLI  Final   Report Status 12/21/2014 FINAL  Final   Organism ID, Bacteria ESCHERICHIA COLI  Final      Susceptibility   Escherichia coli - MIC*    AMPICILLIN >=32 RESISTANT Resistant     CEFAZOLIN <=4 SENSITIVE Sensitive     CEFTRIAXONE <=1 SENSITIVE Sensitive     CIPROFLOXACIN >=4 RESISTANT Resistant     GENTAMICIN <=1 SENSITIVE Sensitive     IMIPENEM <=0.25 SENSITIVE Sensitive     NITROFURANTOIN <=16 SENSITIVE Sensitive     TRIMETH/SULFA <=20 SENSITIVE Sensitive     AMPICILLIN/SULBACTAM 16 INTERMEDIATE Intermediate     PIP/TAZO <=4 SENSITIVE Sensitive     * >=100,000 COLONIES/mL ESCHERICHIA COLI  Clostridium Difficile by PCR (not at [facility])     Status: None   Collection Time: 12/21/14  4:50 AM  Result Value Ref Range Status   Toxigenic C Difficile by pcr NEGATIVE NEGATIVE Final    Medical History: Past Medical History  Diagnosis Date  . Gout   . CKD (chronic kidney disease)   . Diabetes   . TIA (transient  ischemic attack)   . Dyslipidemia   . Anemia   . HTN (hypertension)   . PVD (peripheral vascular disease)   . Orthostatic hypotension 06/12/2014    Medications:  Awaiting home med rec  Assessment: 81 y.o. female presents with AMS after falling today. Pt with recent hospitalization - discharged 7/18 for AMS/fever - treated at that time for [facility] UTI -d/c home with Ceftin. To begin broad spectrum antibiotics for sepsis. Aztreonam 2 gm, Levaquin , and Vanc 1gm ordered in ED. Tm 100.1. SCr 4.3, est CrCl < 10 ml/min.  Goal of Therapy:  Vancomycin trough level 15-20 mcg/ml  Plan:  Levaquin  IV q48h Aztreonam  IV q8h Vancomycin  IV now then 1gm IV  q48h Will f/u HD plans, micro data, and pts clinical condition Consider vanc level in ~48hr to see if pt clearing Vanc  Christoper Fabian, PharmD, BCPS Clinical pharmacist, pager [phone] 01/15/2015,12:13 AM   Addendum: Abx changed to Vancomycin and Zosyn upon admission  Plan: Zosyn 2.25gm IV q8h -each dose over 4 hours  Christoper Fabian, PharmD, BCPS Clinical pharmacist, pager [phone] 01/15/2015 6:06 AM

## 2015-01-16 DIAGNOSIS — E119 Type 2 diabetes mellitus without complications: Secondary | ICD-10-CM

## 2015-01-16 DIAGNOSIS — I10 Essential (primary) hypertension: Secondary | ICD-10-CM

## 2015-01-16 DIAGNOSIS — G9340 Encephalopathy, unspecified: Secondary | ICD-10-CM

## 2015-01-16 LAB — GLUCOSE, CAPILLARY
GLUCOSE-CAPILLARY: 232 mg/dL — AB (ref 65–99)
GLUCOSE-CAPILLARY: 211 mg/dL — AB (ref 65–99)
Glucose-Capillary: 251 mg/dL — ABNORMAL HIGH (ref 65–99)
Glucose-Capillary: 164 mg/dL — ABNORMAL HIGH (ref 65–99)

## 2015-01-16 LAB — CBC
HCT: 27.1 % — ABNORMAL LOW (ref 36.0–46.0)
HEMOGLOBIN: 8.3 g/dL — AB (ref 12.0–15.0)
MCH: 27.9 pg (ref 26.0–34.0)
MCHC: 30.6 g/dL (ref 30.0–36.0)
MCV: 91.2 fL (ref 78.0–100.0)
Platelets: 149 10*3/uL — ABNORMAL LOW (ref 150–400)
RBC: 2.97 MIL/uL — ABNORMAL LOW (ref 3.87–5.11)
RDW: 16.8 % — ABNORMAL HIGH (ref 11.5–15.5)
WBC: 12.3 10*3/uL — AB (ref 4.0–10.5)

## 2015-01-16 LAB — RENAL FUNCTION PANEL
ALBUMIN: 2.4 g/dL — AB (ref 3.5–5.0)
Anion gap: 14 (ref 5–15)
BUN: 70 mg/dL — AB (ref 6–20)
CALCIUM: 8.8 mg/dL — AB (ref 8.9–10.3)
CO2: 20 mmol/L — AB (ref 22–32)
CREATININE: 4.52 mg/dL — AB (ref 0.44–1.00)
Chloride: 104 mmol/L (ref 101–111)
GFR calc Af Amer: 10 mL/min — ABNORMAL LOW (ref >60)
GFR calc non Af Amer: 8 mL/min — ABNORMAL LOW (ref >60)
GLUCOSE: 217 mg/dL — AB (ref 65–99)
Phosphorus: 5.1 mg/dL — ABNORMAL HIGH (ref 2.5–4.6)
Potassium: 5 mmol/L (ref 3.5–5.1)
SODIUM: 138 mmol/L (ref 135–145)

## 2015-01-16 MED ORDER — HYDROCORTISONE NA SUCCINATE PF 100 MG IJ SOLR
25.0000 mg | Freq: Two times a day (BID) | INTRAMUSCULAR | Status: DC
  Administered 2015-01-16 – 2015-01-17 (×2): 25 mg via INTRAVENOUS
  Filled 2015-01-16 (×2): qty 2

## 2015-01-16 NOTE — Evaluation (Signed)
Clinical/Bedside Swallow Evaluation Patient Details  Name: April Casey MRN: [id number] Date of Birth: 1931-07-26  Today's Date: 01/16/2015 Time: SLP Start Time (ACUTE ONLY): 4098 SLP Stop Time (ACUTE ONLY): 0939 SLP Time Calculation (min) (ACUTE ONLY): 17 min  Past Medical History:  Past Medical History  Diagnosis Date  . Gout   . CKD (chronic kidney disease)   . Diabetes   . TIA (transient ischemic attack)   . Dyslipidemia   . Anemia   . HTN (hypertension)   . PVD (peripheral vascular disease)   . Orthostatic hypotension 06/12/2014  . Family history of adverse reaction to anesthesia     daughter has difficulty waking    Past Surgical History:  Past Surgical History  Procedure Laterality Date  . Breast biopsy    . Tubal ligation    . Kidney transplant    . Av shunt for dialyisis     HPI:  April Casey is an 81 year old female with history of renal transplant, chronic kidney disease, diabetes mellitus, hypertension, hypothyroidism, peripheral vascular disease was brought to the ER after patient was found to be increasingly confused due to UTI, chest x-ray showing possible infiltrates.    Assessment / Plan / Recommendation Clinical Impression  Bedside swallow evaluation complete. Patient demonstrates effective mastication and oral clearance with intermittent cues for pacing and portion control.  Patient reported that she was in a hurry to eat so they would let her go home; SLP faded cues and patient continued to demonstrate no overt s/s of aspiration.  Recommend to continue with current diet orders.  Daughter present and reported that her mother appeared much better today.  No skilled SLP services warranted at this time; SLP signing off.     Aspiration Risk  Mild    Diet Recommendation Age appropriate regular solids;Thin   Medication Administration: Whole meds with liquid Compensations: Slow rate;Small sips/bites    Other  Recommendations Oral Care Recommendations: Oral  care BID   Follow Up Recommendations  None           Pertinent Vitals/Pain None     Swallow Study Prior Functional Status  Regular textures and thin liquids     General Other Pertinent Information: April Casey is an 81 year old female with history of renal transplant, chronic kidney disease, diabetes mellitus, hypertension, hypothyroidism, peripheral vascular disease was brought to the ER after patient was found to be increasingly confused due to UTI, chest x-ray showing possible infiltrates.  Type of Study: Bedside swallow evaluation Previous Swallow Assessment: none Diet Prior to this Study: Regular;Thin liquids Temperature Spikes Noted: No Respiratory Status: Room air History of Recent Intubation: No Behavior/Cognition: Alert;Cooperative;Requires cueing Oral Cavity - Dentition: Adequate natural dentition/normal for age Self-Feeding Abilities: Able to feed self Patient Positioning: Other (comment) (edge of bed) Baseline Vocal Quality: Normal Volitional Cough: Strong Volitional Swallow: Able to elicit    Oral/Motor/Sensory Function Overall Oral Motor/Sensory Function: Appears within functional limits for tasks assessed   Ice Chips Ice chips: Not tested   Thin Liquid Thin Liquid: Within functional limits Presentation: Cup    Nectar Thick Nectar Thick Liquid: Not tested   Honey Thick Honey Thick Liquid: Not tested   Puree Puree: Not tested   Solid   GO    Solid: Within functional limits Presentation: Self Fed Other Comments: intermittent cues for portion control      Charlane Ferretti., CCC-SLP [phone]  Zorawar Strollo 01/16/2015,1:48 PM

## 2015-01-16 NOTE — Progress Notes (Addendum)
PATIENT DETAILS Name: April Casey Age: 81 y.o. Sex: female Date of Birth: [birth year] Admit Date: 01/14/2015 Admitting Physician Eduard Clos, MD WUJ:WJXBJ,YNWGNFA S, MD  Subjective: Much improved-awake/alert. Talking with her daughter this am.  Assessment/Plan: Active Problems: Acute encephalopathy:Resolved. Secondary to UTI with sepsis. CT head negative for acute abnormalities.   UTI with sepsis: Resolved, continue IV Zosyn-but stop Vanco. Prelim urine culture positive for E Coli. Follow  Acute on chronic kidney disease stage IV with history of renal transplant: Mild worsening of creatinine, suspect likely secondary to prerenal azotemia in the setting of sepsis/UTI. Not much improvement with IVF-now euvolemic on exam, will stop IVF, andrecheck electrolytes in a.m. Per family and patient-She does not want to be placed back on hemodialysis if and when kidney failure becomes ESRD  Anemia: No indication of blood loss-worsening hemoglobin likely secondary to acute illness/sepsis and CKD. Follow.  History of renal transplant: Continue Prograf and steroids-taper hydrocortisone rapidly in the next few days to usual dosing of prednisone  Type 2 diabetes: CBGs stable, continue 15 units of Lantus and SSI.  Hypothyroidism: Continue with levothyroxine  Dyslipidemia: Continue with statin  Hypertension: BP soft on admission-now much better-will allow BP to rise for another day or two-before restarting antihypertensives  Non-severe (moderate) malnutrition in context of chronic illness  Disposition: Remain inpatient  Antimicrobial agents  See below  Anti-infectives    Start     Dose/Rate Route Frequency Ordered Stop   01/17/15 0000  vancomycin (VANCOCIN) IVPB 1000 mg/200 mL premix     1,000 mg 200 mL/hr over 60 Minutes Intravenous Every 48 hours 01/15/15 0025     01/16/15 2300  levofloxacin (LEVAQUIN) IVPB 500 mg  Status:  Discontinued     500 mg 100 mL/hr  over 60 Minutes Intravenous Every 48 hours 01/15/15 0025 01/15/15 0554   01/15/15 1000  aztreonam (AZACTAM) 500 mg in dextrose 5 % 50 mL IVPB  Status:  Discontinued     500 mg 100 mL/hr over 30 Minutes Intravenous 3 times per day 01/15/15 0025 01/15/15 0606   01/15/15 0700  piperacillin-tazobactam (ZOSYN) IVPB 2.25 g     2.25 g 100 mL/hr over 30 Minutes Intravenous 3 times per day 01/15/15 0607     01/15/15 0030  vancomycin (VANCOCIN) 1,250 mg in sodium chloride 0.9 % 250 mL IVPB     1,250 mg 166.7 mL/hr over 90 Minutes Intravenous  Once 01/15/15 0021 01/15/15 0420   01/14/15 2345  levofloxacin (LEVAQUIN) IVPB 750 mg     750 mg 100 mL/hr over 90 Minutes Intravenous  Once 01/14/15 2335 01/15/15 0248   01/14/15 2345  aztreonam (AZACTAM) 2 g in dextrose 5 % 50 mL IVPB     2 g 100 mL/hr over 30 Minutes Intravenous  Once 01/14/15 2335 01/15/15 0212   01/14/15 2345  vancomycin (VANCOCIN) IVPB 1000 mg/200 mL premix  Status:  Discontinued     1,000 mg 200 mL/hr over 60 Minutes Intravenous  Once 01/14/15 2335 01/15/15 0021      DVT Prophylaxis: Prophylactic Lovenox  Code Status: Full code   Family Communication Daughterat bedside  Procedures: nONE  CONSULTS:  None  Time spent 30 minutes-Greater than 50% of this time was spent in counseling, explanation of diagnosis, planning of further management, and coordination of care.  MEDICATIONS: Scheduled Meds: . allopurinol  100 mg Oral Daily  . aspirin EC  81 mg Oral  Daily  . calcitRIOL  0.25 mcg Oral Daily  . enoxaparin (LOVENOX) injection  30 mg Subcutaneous Daily  . gabapentin  100 mg Oral TID  . hydrocortisone sod succinate (SOLU-CORTEF) inj  25 mg Intravenous Q12H  . insulin aspart  0-9 Units Subcutaneous TID WC  . insulin glargine  15 Units Subcutaneous QHS  . levothyroxine  25 mcg Oral QAC breakfast  . magnesium oxide  400 mg Oral Daily  . multivitamins with iron  1 tablet Oral Daily  . pantoprazole  40 mg Oral Daily  .  piperacillin-tazobactam (ZOSYN)  IV  2.25 g Intravenous 3 times per day  . rosuvastatin  20 mg Oral Daily  . tacrolimus  1 mg Oral BID  . [START ON 01/17/2015] vancomycin  1,000 mg Intravenous Q48H   Continuous Infusions:   PRN Meds:.acetaminophen **OR** acetaminophen, ondansetron **OR** ondansetron (ZOFRAN) IV    PHYSICAL EXAM: Vital signs in last 24 hours: Filed Vitals:   01/15/15 1315 01/15/15 1423 01/15/15 2157 01/16/15 0516  BP: 120/54 138/58 126/49 151/64  Pulse: 73 73 81 78  Temp:  98.2 F (36.8 C) 98 F (36.7 C) 97.6 F (36.4 C)  TempSrc:  Oral Oral Oral  Resp:  20 18 18   Height:      Weight:    62 kg (136 lb 11 oz)  SpO2: 98% 100% 92% 96%    Weight change: 0.764 kg (1 lb 11 oz) Filed Weights   01/14/15 2257 01/16/15 0516  Weight: 61.236 kg (135 lb) 62 kg (136 lb 11 oz)   Body mass index is 24.22 kg/(m^2).   Gen Exam: Awake and alert with clear speech.   Neck: Supple, No JVD.   Chest: B/L Clear.   CVS: S1 S2 Regular, no murmurs.  Abdomen: soft, BS +, non tender, non distended.  Extremities: no edema, lower extremities warm to touch. Neurologic: Non Focal.   Skin: No Rash.   Wounds: N/A.   Intake/Output from previous day:  Intake/Output Summary (Last 24 hours) at 01/16/15 1325 Last data filed at 01/16/15 1010  Gross per 24 hour  Intake    120 ml  Output      0 ml  Net    120 ml     LAB RESULTS: CBC  Recent Labs Lab 01/14/15 2358 01/15/15 0154 01/15/15 0630 01/16/15 0655  WBC  --  15.1* 12.3* 12.3*  HGB 12.6 11.4* 8.8* 8.3*  HCT 37.0 37.1 28.6* 27.1*  PLT  --  PLATELET CLUMPS NOTED ON SMEAR, UNABLE TO ESTIMATE 146* 149*  MCV  --  92.8 92.3 91.2  MCH  --  28.5 28.4 27.9  MCHC  --  30.7 30.8 30.6  RDW  --  17.0* 17.0* 16.8*  LYMPHSABS  --  1.2 0.8  --   MONOABS  --  0.9 0.8  --   EOSABS  --  0.2 0.0  --   BASOSABS  --  0.0 0.0  --     Chemistries   Recent Labs Lab 01/14/15 2353 01/14/15 2358 01/15/15 0630 01/16/15 0656  NA 138  136 139 138  K 5.3* 5.1 5.0 5.0  CL 98* 98* 104 104  CO2 26  --  25 20*  GLUCOSE 175* 174* 126* 217*  BUN 65* 65* 65* 70*  CREATININE 4.49* 4.30* 4.56* 4.52*  CALCIUM 10.4*  --  8.9 8.8*    CBG:  Recent Labs Lab 01/15/15 1153 01/15/15 1701 01/15/15 2156 01/16/15 0806 01/16/15 1146  GLUCAP 177*  228* 261* 232* 251*    GFR Estimated Creatinine Clearance: 8.2 mL/min (by C-G formula based on Cr of 4.52).  Coagulation profile  Recent Labs Lab 01/15/15 0055  INR 1.25    Cardiac Enzymes No results for input(s): CKMB, TROPONINI, MYOGLOBIN in the last 168 hours.  Invalid input(s): CK  Invalid input(s): POCBNP No results for input(s): DDIMER in the last 72 hours. No results for input(s): HGBA1C in the last 72 hours. No results for input(s): CHOL, HDL, LDLCALC, TRIG, CHOLHDL, LDLDIRECT in the last 72 hours. No results for input(s): TSH, T4TOTAL, T3FREE, THYROIDAB in the last 72 hours.  Invalid input(s): FREET3 No results for input(s): VITAMINB12, FOLATE, FERRITIN, TIBC, IRON, RETICCTPCT in the last 72 hours.  Recent Labs  01/14/15 2353  LIPASE 18*    Urine Studies No results for input(s): UHGB, CRYS in the last 72 hours.  Invalid input(s): UACOL, UAPR, USPG, UPH, UTP, UGL, UKET, UBIL, UNIT, UROB, ULEU, UEPI, UWBC, URBC, UBAC, CAST, UCOM, BILUA  MICROBIOLOGY: Recent Results (from the past 240 hour(s))  Urine culture     Status: None (Preliminary result)   Collection Time: 01/15/15  1:01 AM  Result Value Ref Range Status   Specimen Description URINE, CATHETERIZED  Final   Special Requests NONE  Final   Culture >=100,000 COLONIES/mL ESCHERICHIA COLI  Final   Report Status PENDING  Incomplete    RADIOLOGY STUDIES/RESULTS: Dg Chest 2 View  01/15/2015   CLINICAL DATA:  Patient found unresponsive.  Initial encounter.  EXAM: CHEST  2 VIEW  COMPARISON:  Chest radiograph performed 12/18/2014  FINDINGS: The lungs are hypoexpanded. Vascular crowding and vascular  congestion are seen. Left basilar airspace opacity may reflect atelectasis or pneumonia. No pleural effusion or pneumothorax is seen.  The heart is borderline normal in size. No acute osseous abnormalities are seen. Diffuse calcification is seen along the abdominal aorta.  IMPRESSION: Lungs hypoexpanded. Vascular congestion noted. Left basilar airspace opacity may reflect atelectasis or pneumonia.   Electronically Signed   By: Roanna Raider M.D.   On: 01/15/2015 02:12   Dg Pelvis 1-2 Views  01/15/2015   CLINICAL DATA:  Fall, confusion  EXAM: PELVIS - 1-2 VIEW  COMPARISON:  None.  FINDINGS: Single frontal view of the pelvis submitted. Diffuse osteopenia. No acute fracture or subluxation. Atherosclerotic calcifications of iliac and femoral arteries.  IMPRESSION: Negative.   Electronically Signed   By: Natasha Mead M.D.   On: 01/15/2015 08:28   Ct Head Wo Contrast  01/15/2015   CLINICAL DATA:  Patient found unresponsive. Acute encephalopathy. Initial encounter.  EXAM: CT HEAD WITHOUT CONTRAST  TECHNIQUE: Contiguous axial images were obtained from the base of the skull through the vertex without intravenous contrast.  COMPARISON:  CT of the head performed 01/11/2010  FINDINGS: There is no evidence of acute infarction, mass lesion, or intra- or extra-axial hemorrhage on CT.  Scattered periventricular and subcortical white matter change likely reflects small vessel ischemic microangiopathy. Chronic lacunar infarcts are seen at the left basal ganglia. A chronic lacunar infarct is noted at the right cerebellar hemisphere.  The posterior fossa, including the cerebellum, brainstem and fourth ventricle, is within normal limits. The third and lateral ventricles, and basal ganglia are unremarkable in appearance. The cerebral hemispheres are symmetric in appearance, with normal gray-white differentiation. No mass effect or midline shift is seen.  There is no evidence of fracture; visualized osseous structures are  unremarkable in appearance. The orbits are within normal limits. There is opacification of  the left maxillary sinus. The remaining paranasal sinuses and mastoid air cells are well-aerated. No significant soft tissue abnormalities are seen.  IMPRESSION: 1. No acute intracranial pathology seen on CT. 2. Scattered small vessel ischemic microangiopathy. 3. Chronic lacunar infarcts at the left basal ganglia, and chronic lacunar infarct at the right cerebellar hemisphere. 4. Opacification of the left maxillary sinus.   Electronically Signed   By: Roanna Raider M.D.   On: 01/15/2015 05:20   US Renal  12/19/2014   CLINICAL DATA:  Patient with worsening renal function.  EXAM: RENAL / URINARY TRACT ULTRASOUND COMPLETE  COMPARISON:  None.  FINDINGS: Right Kidney:  Length: 5.4 cm. Atrophic. No hydronephrosis. Increased renal cortical echogenicity. Sub cm hypoechoic lesion, too small to characterize.  Left Kidney:  Length: 7.5 cm. Atrophic. Increased renal cortical echogenicity. No hydronephrosis.  Bladder:  Appears normal for degree of bladder distention.  Right upper quadrant renal transplant: Normal renal cortical thickness and echogenicity. No hydronephrosis.  Right lower quadrant renal transplant: Normal renal cortical thickness and echogenicity. No hydronephrosis.  IMPRESSION: No hydronephrosis within the right upper quadrant for right lower quadrant renal transplant.  Atrophic native kidneys.  Consider dedicated renal transplant Doppler evaluation as clinically indicated.   Electronically Signed   By: Annia Belt M.D.   On: 12/19/2014 11:34   Dg Chest Port 1 View  12/18/2014   CLINICAL DATA:  81 year old female with shortness of breath  EXAM: PORTABLE CHEST - 1 VIEW  COMPARISON:  Radiograph dated 01/31/2011  FINDINGS: The heart size and mediastinal contours are within normal limits. Both lungs are clear. The visualized skeletal structures are unremarkable.  IMPRESSION: No active disease.   Electronically Signed    By: Elgie Collard M.D.   On: 12/18/2014 23:03    Jeoffrey Massed, MD  [facility] Pager:[phone]  If 7PM-7AM, please contact night-coverage [email or web address] Password [email or web address] 01/16/2015, 1:25 PM   LOS: 1 day

## 2015-01-16 NOTE — Evaluation (Signed)
Physical Therapy Evaluation Patient Details Name: April Casey MRN: [id number] DOB: 10-21-1931 Today's Date: 01/16/2015   History of Present Illness  Pt adm with UTI and sepsis. PMH - renal transplant, HTN, DM  Clinical Impression  Pt admitted with above diagnosis and presents to PT with functional limitations due to deficits listed below (See PT problem list). Pt needs skilled PT to maximize independence and safety to allow discharge to home alone. Will follow acutely but don't feel pt will need PT after DC. Instructed pt to use her cane or walker for a few days after dc.     Follow Up Recommendations No PT follow up    Equipment Recommendations  None recommended by PT    Recommendations for Other Services       Precautions / Restrictions Precautions Precautions: Fall      Mobility  Bed Mobility Overal bed mobility: Modified Independent                Transfers Overall transfer level: Modified independent                  Ambulation/Gait Ambulation/Gait assistance: Supervision Ambulation Distance (Feet): 225 Feet Assistive device: None Gait Pattern/deviations: Step-through pattern;Decreased stride length   Gait velocity interpretation: at or above normal speed for age/gender General Gait Details: slightly unsteady but no LOB  Stairs            Wheelchair Mobility    Modified Rankin (Stroke Patients Only)       Balance Overall balance assessment: Needs assistance         Standing balance support: No upper extremity supported Standing balance-Leahy Scale: Good                               Pertinent Vitals/Pain Pain Assessment: No/denies pain    Home Living Family/patient expects to be discharged to:: Private residence Living Arrangements: Alone Available Help at Discharge: Family;Available PRN/intermittently Type of Home: House Home Access: Stairs to enter Entrance Stairs-Rails: Lawyer  of Steps: 3 Home Layout: One level Home Equipment: Walker - 2 wheels;Cane - quad      Prior Function Level of Independence: Independent         Comments: usually doesn't use assistive device.     Hand Dominance        Extremity/Trunk Assessment   Upper Extremity Assessment: Overall WFL for tasks assessed           Lower Extremity Assessment: LLE deficits/detail;Overall WFL for tasks assessed         Communication   Communication: No difficulties  Cognition Arousal/Alertness: Awake/alert Behavior During Therapy: WFL for tasks assessed/performed Overall Cognitive Status: Within Functional Limits for tasks assessed                      General Comments      Exercises        Assessment/Plan    PT Assessment Patient needs continued PT services  PT Diagnosis Difficulty walking   PT Problem List Decreased balance;Decreased mobility  PT Treatment Interventions DME instruction;Gait training;Functional mobility training;Therapeutic activities;Therapeutic exercise;Balance training;Patient/family education   PT Goals (Current goals can be found in the Care Plan section) Acute Rehab PT Goals Patient Stated Goal: return home PT Goal Formulation: With patient Time For Goal Achievement: 01/23/15 Potential to Achieve Goals: Good    Frequency Min 2X/week   Barriers to discharge  Co-evaluation               End of Session   Activity Tolerance: Patient tolerated treatment well Patient left: in bed;with call bell/phone within reach;with bed alarm set;with family/visitor present Nurse Communication: Mobility status         Time: 1610-9604 PT Time Calculation (min) (ACUTE ONLY): 14 min   Charges:   PT Evaluation $Initial PT Evaluation Tier I: 1 Procedure     PT G Codes:        Delvis Kau January 28, 2015, 2:14 PM  [facility] PT [phone]

## 2015-01-16 NOTE — Progress Notes (Signed)
Inpatient Diabetes Program Recommendations  AACE/ADA: New Consensus Statement on Inpatient Glycemic Control (2013)  Target Ranges:  Prepandial:   less than 140 mg/dL      Peak postprandial:   less than 180 mg/dL (1-2 hours)      Critically ill patients:  140 - 180 mg/dL   Inpatient Diabetes Program Recommendations Insulin - Meal Coverage: While on IV steroid therapy, may want to add low dose meal coverage of 2-3 units tidwc in addition to sensitive correction tidwc.  Thank you Lenor Coffin, RN, MSN, CDE  Diabetes Inpatient Program Office: [phone] Pager: [phone] 8:00 am to 5:00 pm

## 2015-01-16 NOTE — Care Management Note (Addendum)
Case Management Note  Patient Details  Name: April Casey MRN: [id number] Date of Birth: 01-20-1932  Subjective/Objective:  Patient is from home alone, has renal transplant hx, await pt eval.  Melissa with [facility] Case Management states they follow patient telephonically, patient will need [facility] for med management, also daughter April Casey [phone]  States patient will need the same , she chose [facility], referral made to [facility], Miranda notified.  Soc will begin 24-48 hrs post dc.  NCM will cont to follow for dc needs.                  Action/Plan:   Expected Discharge Date:                  Expected Discharge Plan:  Home w Home Health Services  In-House Referral:     Discharge planning Services  CM Consult  Post Acute Care Choice:    Choice offered to:   adult children  DME Arranged:    DME Agency:     HH Arranged:   HHRN HH Agency:   [facility]  Status of Service:  In process, will continue to follow  Medicare Important Message Given:    Date Medicare IM Given:    Medicare IM give by:    Date Additional Medicare IM Given:    Additional Medicare Important Message give by:     If discussed at Long Length of Stay Meetings, dates discussed:    Additional Comments:  Leone Haven, RN 01/16/2015, 11:38 AM

## 2015-01-17 DIAGNOSIS — N390 Urinary tract infection, site not specified: Secondary | ICD-10-CM

## 2015-01-17 DIAGNOSIS — A4151 Sepsis due to Escherichia coli [E. coli]: Secondary | ICD-10-CM

## 2015-01-17 LAB — CBC
HCT: 28.2 % — ABNORMAL LOW (ref 36.0–46.0)
Hemoglobin: 8.9 g/dL — ABNORMAL LOW (ref 12.0–15.0)
MCH: 27.9 pg (ref 26.0–34.0)
MCHC: 31.6 g/dL (ref 30.0–36.0)
MCV: 88.4 fL (ref 78.0–100.0)
PLATELETS: 179 10*3/uL (ref 150–400)
RBC: 3.19 MIL/uL — AB (ref 3.87–5.11)
RDW: 16.7 % — ABNORMAL HIGH (ref 11.5–15.5)
WBC: 10.9 10*3/uL — AB (ref 4.0–10.5)

## 2015-01-17 LAB — URINE CULTURE: Culture: >=100000

## 2015-01-17 LAB — GLUCOSE, CAPILLARY
GLUCOSE-CAPILLARY: 86 mg/dL (ref 65–99)
GLUCOSE-CAPILLARY: 128 mg/dL — AB (ref 65–99)
GLUCOSE-CAPILLARY: 204 mg/dL — AB (ref 65–99)
Glucose-Capillary: 135 mg/dL — ABNORMAL HIGH (ref 65–99)

## 2015-01-17 LAB — BASIC METABOLIC PANEL
Anion gap: 11 (ref 5–15)
BUN: 72 mg/dL — AB (ref 6–20)
CALCIUM: 9.5 mg/dL (ref 8.9–10.3)
CO2: 22 mmol/L (ref 22–32)
Chloride: 106 mmol/L (ref 101–111)
Creatinine, Ser: 4.44 mg/dL — ABNORMAL HIGH (ref 0.44–1.00)
GFR, EST AFRICAN AMERICAN: 10 mL/min — AB (ref >60)
GFR, EST NON AFRICAN AMERICAN: 9 mL/min — AB (ref >60)
Glucose, Bld: 101 mg/dL — ABNORMAL HIGH (ref 65–99)
Potassium: 3.8 mmol/L (ref 3.5–5.1)
SODIUM: 139 mmol/L (ref 135–145)

## 2015-01-17 LAB — TACROLIMUS LEVEL: TACROLIMUS (FK506) - LABCORP: 1.3 ng/mL — AB (ref 2.0–20.0)

## 2015-01-17 MED ORDER — LABETALOL HCL 200 MG PO TABS
200.0000 mg | ORAL_TABLET | Freq: Two times a day (BID) | ORAL | Status: DC
  Administered 2015-01-17 – 2015-01-18 (×3): 200 mg via ORAL
  Filled 2015-01-17 (×3): qty 1

## 2015-01-17 NOTE — Progress Notes (Signed)
PATIENT DETAILS Name: April Casey Age: 81 y.o. Sex: female Date of Birth: 07-Dec-1931 Admit Date: 01/14/2015 Admitting Physician Eduard Clos, MD WUJ:WJXBJ,YNWGNFA S, MD  Subjective: Awake/alert. Wants to go home  Assessment/Plan: Active Problems: Acute encephalopathy:Resolved. Secondary to UTI with sepsis. CT head negative for acute abnormalities.   UTI with sepsis: Resolved, continue IV Zosyn-. Prelim urine culture positive for E Coli, blood culture X 1 positive for gram neg rods. Follow  Gram neg bacteremia:from UTI- await final results. On Zosyn  Acute on chronic kidney disease stage IV with history of renal transplant: Mild worsening of creatinine, suspect likely secondary to prerenal azotemia in the setting of sepsis/UTI. Creatinine slowly down trending. Per family and patient-She does not want to be placed back on hemodialysis if and when kidney failure becomes ESRD  Anemia: No indication of blood loss-worsening hemoglobin likely secondary to acute illness/sepsis and CKD. Follow.  History of renal transplant: Continue Prograf and steroids-discontinue stress dose-taper back to hydrocortisone  usual dosing of prednisone. Spoke with Dr Briant Cedar on 8/13-recommended to recheck prograf trough level in am as random level low.   Type 2 diabetes: CBGs stable, continue 15 units of Lantus and SSI.  Hypothyroidism: Continue with levothyroxine  Dyslipidemia: Continue with statin  Hypertension: BP soft on admission-now much better-will allow BP to rise for another day or two-before restarting antihypertensives  Non-severe (moderate) malnutrition in context of chronic illness  Disposition: Remain inpatient  Antimicrobial agents  See below  Anti-infectives    Start     Dose/Rate Route Frequency Ordered Stop   01/17/15 0000  vancomycin (VANCOCIN) IVPB 1000 mg/200 mL premix  Status:  Discontinued     1,000 mg 200 mL/hr over 60 Minutes Intravenous Every  48 hours 01/15/15 0025 01/16/15 1416   01/16/15 2300  levofloxacin (LEVAQUIN) IVPB 500 mg  Status:  Discontinued     500 mg 100 mL/hr over 60 Minutes Intravenous Every 48 hours 01/15/15 0025 01/15/15 0554   01/15/15 1000  aztreonam (AZACTAM) 500 mg in dextrose 5 % 50 mL IVPB  Status:  Discontinued     500 mg 100 mL/hr over 30 Minutes Intravenous 3 times per day 01/15/15 0025 01/15/15 0606   01/15/15 0700  piperacillin-tazobactam (ZOSYN) IVPB 2.25 g     2.25 g 100 mL/hr over 30 Minutes Intravenous 3 times per day 01/15/15 0607     01/15/15 0030  vancomycin (VANCOCIN) 1,250 mg in sodium chloride 0.9 % 250 mL IVPB     1,250 mg 166.7 mL/hr over 90 Minutes Intravenous  Once 01/15/15 0021 01/15/15 0420   01/14/15 2345  levofloxacin (LEVAQUIN) IVPB 750 mg     750 mg 100 mL/hr over 90 Minutes Intravenous  Once 01/14/15 2335 01/15/15 0248   01/14/15 2345  aztreonam (AZACTAM) 2 g in dextrose 5 % 50 mL IVPB     2 g 100 mL/hr over 30 Minutes Intravenous  Once 01/14/15 2335 01/15/15 0212   01/14/15 2345  vancomycin (VANCOCIN) IVPB 1000 mg/200 mL premix  Status:  Discontinued     1,000 mg 200 mL/hr over 60 Minutes Intravenous  Once 01/14/15 2335 01/15/15 0021      DVT Prophylaxis: Prophylactic Lovenox  Code Status: Full code   Family Communication Daughter over the phone  Procedures: nONE  CONSULTS:  None  Time spent 25 minutes-Greater than 50% of this time was spent in counseling, explanation of diagnosis, planning of  further management, and coordination of care.  MEDICATIONS: Scheduled Meds: . allopurinol  100 mg Oral Daily  . aspirin EC  81 mg Oral Daily  . calcitRIOL  0.25 mcg Oral Daily  . enoxaparin (LOVENOX) injection  30 mg Subcutaneous Daily  . gabapentin  100 mg Oral TID  . hydrocortisone sod succinate (SOLU-CORTEF) inj  25 mg Intravenous Q12H  . insulin aspart  0-9 Units Subcutaneous TID WC  . insulin glargine  15 Units Subcutaneous QHS  . levothyroxine  25 mcg Oral  QAC breakfast  . magnesium oxide  400 mg Oral Daily  . multivitamins with iron  1 tablet Oral Daily  . pantoprazole  40 mg Oral Daily  . piperacillin-tazobactam (ZOSYN)  IV  2.25 g Intravenous 3 times per day  . rosuvastatin  20 mg Oral Daily  . tacrolimus  1 mg Oral BID   Continuous Infusions:   PRN Meds:.acetaminophen **OR** acetaminophen, ondansetron **OR** ondansetron (ZOFRAN) IV    PHYSICAL EXAM: Vital signs in last 24 hours: Filed Vitals:   01/16/15 0516 01/16/15 1348 01/16/15 2124 01/17/15 0533  BP: 151/64 149/69 145/52 182/77  Pulse: 78 84 87 81  Temp: 97.6 F (36.4 C) 97.9 F (36.6 C) 98.4 F (36.9 C) 98.6 F (37 C)  TempSrc: Oral Oral Oral Oral  Resp: 18 18 18 18   Height:      Weight: 62 kg (136 lb 11 oz)   63.5 kg (139 lb 15.9 oz)  SpO2: 96% 100% 97% 100%    Weight change: 1.5 kg (3 lb 4.9 oz) Filed Weights   01/14/15 2257 01/16/15 0516 01/17/15 0533  Weight: 61.236 kg (135 lb) 62 kg (136 lb 11 oz) 63.5 kg (139 lb 15.9 oz)   Body mass index is 24.8 kg/(m^2).   Gen Exam: Awake and alert with clear speech.   Neck: Supple, No JVD.   Chest: B/L Clear.   CVS: S1 S2 Regular, no murmurs.  Abdomen: soft, BS +, non tender, non distended.  Extremities: no edema, lower extremities warm to touch. Neurologic: Non Focal.   Skin: No Rash.   Wounds: N/A.   Intake/Output from previous day:  Intake/Output Summary (Last 24 hours) at 01/17/15 1201 Last data filed at 01/17/15 0944  Gross per 24 hour  Intake    706 ml  Output      0 ml  Net    706 ml     LAB RESULTS: CBC  Recent Labs Lab 01/14/15 2358 01/15/15 0154 01/15/15 0630 01/16/15 0655 01/17/15 0700  WBC  --  15.1* 12.3* 12.3* 10.9*  HGB 12.6 11.4* 8.8* 8.3* 8.9*  HCT 37.0 37.1 28.6* 27.1* 28.2*  PLT  --  PLATELET CLUMPS NOTED ON SMEAR, UNABLE TO ESTIMATE 146* 149* 179  MCV  --  92.8 92.3 91.2 88.4  MCH  --  28.5 28.4 27.9 27.9  MCHC  --  30.7 30.8 30.6 31.6  RDW  --  17.0* 17.0* 16.8* 16.7*   LYMPHSABS  --  1.2 0.8  --   --   MONOABS  --  0.9 0.8  --   --   EOSABS  --  0.2 0.0  --   --   BASOSABS  --  0.0 0.0  --   --     Chemistries   Recent Labs Lab 01/14/15 2353 01/14/15 2358 01/15/15 0630 01/16/15 0656 01/17/15 0700  NA 138 136 139 138 139  K 5.3* 5.1 5.0 5.0 3.8  CL 98* 98* 104  104 106  CO2 26  --  25 20* 22  GLUCOSE 175* 174* 126* 217* 101*  BUN 65* 65* 65* 70* 72*  CREATININE 4.49* 4.30* 4.56* 4.52* 4.44*  CALCIUM 10.4*  --  8.9 8.8* 9.5    CBG:  Recent Labs Lab 01/16/15 0806 01/16/15 1146 01/16/15 1700 01/16/15 2121 01/17/15 0750  GLUCAP 232* 251* 164* 211* 86    GFR Estimated Creatinine Clearance: 9.1 mL/min (by C-G formula based on Cr of 4.44).  Coagulation profile  Recent Labs Lab 01/15/15 0055  INR 1.25    Cardiac Enzymes No results for input(s): CKMB, TROPONINI, MYOGLOBIN in the last 168 hours.  Invalid input(s): CK  Invalid input(s): POCBNP No results for input(s): DDIMER in the last 72 hours. No results for input(s): HGBA1C in the last 72 hours. No results for input(s): CHOL, HDL, LDLCALC, TRIG, CHOLHDL, LDLDIRECT in the last 72 hours. No results for input(s): TSH, T4TOTAL, T3FREE, THYROIDAB in the last 72 hours.  Invalid input(s): FREET3 No results for input(s): VITAMINB12, FOLATE, FERRITIN, TIBC, IRON, RETICCTPCT in the last 72 hours.  Recent Labs  01/14/15 2353  LIPASE 18*    Urine Studies No results for input(s): UHGB, CRYS in the last 72 hours.  Invalid input(s): UACOL, UAPR, USPG, UPH, UTP, UGL, UKET, UBIL, UNIT, UROB, ULEU, UEPI, UWBC, URBC, UBAC, CAST, UCOM, BILUA  MICROBIOLOGY: Recent Results (from the past 240 hour(s))  Blood Culture (routine x 2)     Status: None (Preliminary result)   Collection Time: 01/15/15 12:38 AM  Result Value Ref Range Status   Specimen Description BLOOD RIGHT ARM  Final   Special Requests BOTTLES DRAWN AEROBIC ONLY 5 CC  Final   Culture  Setup Time   Final    GRAM  NEGATIVE RODS AEROBIC BOTTLE ONLY CRITICAL RESULT CALLED TO, READ BACK BY AND VERIFIED WITH: E GARNETT RN [id number] 01/16/15 A BROWNING    Culture GRAM NEGATIVE RODS  Final   Report Status PENDING  Incomplete  Urine culture     Status: None   Collection Time: 01/15/15  1:01 AM  Result Value Ref Range Status   Specimen Description URINE, CATHETERIZED  Final   Special Requests NONE  Final   Culture >=100,000 COLONIES/mL ESCHERICHIA COLI  Final   Report Status 01/17/2015 FINAL  Final   Organism ID, Bacteria ESCHERICHIA COLI  Final      Susceptibility   Escherichia coli - MIC*    AMPICILLIN >=32 RESISTANT Resistant     CEFAZOLIN <=4 SENSITIVE Sensitive     CEFTRIAXONE <=1 SENSITIVE Sensitive     CIPROFLOXACIN >=4 RESISTANT Resistant     GENTAMICIN <=1 SENSITIVE Sensitive     IMIPENEM <=0.25 SENSITIVE Sensitive     NITROFURANTOIN <=16 SENSITIVE Sensitive     TRIMETH/SULFA <=20 SENSITIVE Sensitive     AMPICILLIN/SULBACTAM 16 INTERMEDIATE Intermediate     PIP/TAZO <=4 SENSITIVE Sensitive     * >=100,000 COLONIES/mL ESCHERICHIA COLI  Culture, blood (routine x 2)     Status: None (Preliminary result)   Collection Time: 01/15/15  9:36 AM  Result Value Ref Range Status   Specimen Description BLOOD RIGHT ANTECUBITAL  Final   Special Requests BOTTLES DRAWN AEROBIC AND ANAEROBIC 5CC SET 2  Final   Culture NO GROWTH 1 DAY  Final   Report Status PENDING  Incomplete    RADIOLOGY STUDIES/RESULTS: Dg Chest 2 View  01/15/2015   CLINICAL DATA:  Patient found unresponsive.  Initial encounter.  EXAM: CHEST  2  VIEW  COMPARISON:  Chest radiograph performed 12/18/2014  FINDINGS: The lungs are hypoexpanded. Vascular crowding and vascular congestion are seen. Left basilar airspace opacity may reflect atelectasis or pneumonia. No pleural effusion or pneumothorax is seen.  The heart is borderline normal in size. No acute osseous abnormalities are seen. Diffuse calcification is seen along the abdominal aorta.   IMPRESSION: Lungs hypoexpanded. Vascular congestion noted. Left basilar airspace opacity may reflect atelectasis or pneumonia.   Electronically Signed   By: Roanna Raider M.D.   On: 01/15/2015 02:12   Dg Pelvis 1-2 Views  01/15/2015   CLINICAL DATA:  Fall, confusion  EXAM: PELVIS - 1-2 VIEW  COMPARISON:  None.  FINDINGS: Single frontal view of the pelvis submitted. Diffuse osteopenia. No acute fracture or subluxation. Atherosclerotic calcifications of iliac and femoral arteries.  IMPRESSION: Negative.   Electronically Signed   By: Natasha Mead M.D.   On: 01/15/2015 08:28   Ct Head Wo Contrast  01/15/2015   CLINICAL DATA:  Patient found unresponsive. Acute encephalopathy. Initial encounter.  EXAM: CT HEAD WITHOUT CONTRAST  TECHNIQUE: Contiguous axial images were obtained from the base of the skull through the vertex without intravenous contrast.  COMPARISON:  CT of the head performed 01/11/2010  FINDINGS: There is no evidence of acute infarction, mass lesion, or intra- or extra-axial hemorrhage on CT.  Scattered periventricular and subcortical white matter change likely reflects small vessel ischemic microangiopathy. Chronic lacunar infarcts are seen at the left basal ganglia. A chronic lacunar infarct is noted at the right cerebellar hemisphere.  The posterior fossa, including the cerebellum, brainstem and fourth ventricle, is within normal limits. The third and lateral ventricles, and basal ganglia are unremarkable in appearance. The cerebral hemispheres are symmetric in appearance, with normal gray-white differentiation. No mass effect or midline shift is seen.  There is no evidence of fracture; visualized osseous structures are unremarkable in appearance. The orbits are within normal limits. There is opacification of the left maxillary sinus. The remaining paranasal sinuses and mastoid air cells are well-aerated. No significant soft tissue abnormalities are seen.  IMPRESSION: 1. No acute intracranial  pathology seen on CT. 2. Scattered small vessel ischemic microangiopathy. 3. Chronic lacunar infarcts at the left basal ganglia, and chronic lacunar infarct at the right cerebellar hemisphere. 4. Opacification of the left maxillary sinus.   Electronically Signed   By: Roanna Raider M.D.   On: 01/15/2015 05:20   US Renal  12/19/2014   CLINICAL DATA:  Patient with worsening renal function.  EXAM: RENAL / URINARY TRACT ULTRASOUND COMPLETE  COMPARISON:  None.  FINDINGS: Right Kidney:  Length: 5.4 cm. Atrophic. No hydronephrosis. Increased renal cortical echogenicity. Sub cm hypoechoic lesion, too small to characterize.  Left Kidney:  Length: 7.5 cm. Atrophic. Increased renal cortical echogenicity. No hydronephrosis.  Bladder:  Appears normal for degree of bladder distention.  Right upper quadrant renal transplant: Normal renal cortical thickness and echogenicity. No hydronephrosis.  Right lower quadrant renal transplant: Normal renal cortical thickness and echogenicity. No hydronephrosis.  IMPRESSION: No hydronephrosis within the right upper quadrant for right lower quadrant renal transplant.  Atrophic native kidneys.  Consider dedicated renal transplant Doppler evaluation as clinically indicated.   Electronically Signed   By: Annia Belt M.D.   On: 12/19/2014 11:34   Dg Chest Port 1 View  12/18/2014   CLINICAL DATA:  81 year old female with shortness of breath  EXAM: PORTABLE CHEST - 1 VIEW  COMPARISON:  Radiograph dated 01/31/2011  FINDINGS: The heart  size and mediastinal contours are within normal limits. Both lungs are clear. The visualized skeletal structures are unremarkable.  IMPRESSION: No active disease.   Electronically Signed   By: Elgie Collard M.D.   On: 12/18/2014 23:03    Jeoffrey Massed, MD  [facility] Pager:[phone]  If 7PM-7AM, please contact night-coverage [email or web address] Password [email or web address] 01/17/2015, 12:01 PM   LOS: 2 days

## 2015-01-17 NOTE — Progress Notes (Signed)
Per patient, she cannot take gabapentin anymore. Patient states that her PCP took her off gabapentin because it made her confused. Patient requests to have gabapentin taken off her list of scheduled medications during her stay here. RN will continue to monitor.

## 2015-01-18 LAB — BASIC METABOLIC PANEL
ANION GAP: 11 (ref 5–15)
BUN: 69 mg/dL — AB (ref 6–20)
CHLORIDE: 114 mmol/L — AB (ref 101–111)
CO2: 18 mmol/L — AB (ref 22–32)
Calcium: 9.3 mg/dL (ref 8.9–10.3)
Creatinine, Ser: 4.21 mg/dL — ABNORMAL HIGH (ref 0.44–1.00)
GFR calc non Af Amer: 9 mL/min — ABNORMAL LOW (ref >60)
GFR, EST AFRICAN AMERICAN: 11 mL/min — AB (ref >60)
Glucose, Bld: 69 mg/dL (ref 65–99)
POTASSIUM: 4.2 mmol/L (ref 3.5–5.1)
Sodium: 143 mmol/L (ref 135–145)

## 2015-01-18 LAB — CULTURE, BLOOD (ROUTINE X 2)

## 2015-01-18 LAB — GLUCOSE, CAPILLARY
GLUCOSE-CAPILLARY: 75 mg/dL (ref 65–99)
GLUCOSE-CAPILLARY: 101 mg/dL — AB (ref 65–99)

## 2015-01-18 MED ORDER — CEPHALEXIN 250 MG PO CAPS: 250.0000 mg | ORAL_CAPSULE | Freq: Two times a day (BID) | ORAL | Status: DC

## 2015-01-18 MED ORDER — CEPHALEXIN 250 MG PO CAPS: 500.0000 mg | ORAL_CAPSULE | Freq: Two times a day (BID) | ORAL | Status: DC

## 2015-01-18 NOTE — Discharge Summary (Addendum)
PATIENT DETAILS Name: April Casey Age: 81 y.o. Sex: female Date of Birth: 1931-10-01 MRN: [id number]. Admitting Physician: Eduard Clos, MD WUJ:[id number],[id number] S, MD  Admit Date: 01/14/2015 Discharge date: 01/18/2015  Recommendations for Outpatient Follow-up:  1. Repeat CBC/BMET at next visit 2. Trough prograf level pending-please follow 3. Please repeat blood cultures once patient has completed Abx treatment.   PRIMARY DISCHARGE DIAGNOSIS:  Active Problems:   HTN (hypertension)   Acute encephalopathy   Diabetes mellitus without complication   Sepsis   UTI (lower urinary tract infection)   Encephalopathy   Malnutrition of moderate degree      PAST MEDICAL HISTORY: Past Medical History  Diagnosis Date  . Gout   . CKD (chronic kidney disease)   . Diabetes   . TIA (transient ischemic attack)   . Dyslipidemia   . Anemia   . HTN (hypertension)   . PVD (peripheral vascular disease)   . Orthostatic hypotension 06/12/2014  . Family history of adverse reaction to anesthesia     daughter has difficulty waking     DISCHARGE MEDICATIONS: Current Discharge Medication List    CONTINUE these medications which have CHANGED   Details  cephALEXin (KEFLEX) 250 MG capsule Take 1 capsule (250 mg total) by mouth 2 (two) times daily. Qty: 20 capsule, Refills: 0      CONTINUE these medications which have NOT CHANGED   Details  acetaminophen (TYLENOL) 325 MG tablet Take 650 mg by mouth every 6 (six) hours as needed (pain).    insulin glargine (LANTUS) 100 UNIT/ML injection Inject 0.15 mLs (15 Units total) into the skin at bedtime. Qty: 10 mL, Refills: 11    labetalol (NORMODYNE) 200 MG tablet Take 200 mg by mouth 2 (two) times daily.  Refills: 5    tacrolimus (PROGRAF) 1 MG capsule Take 1 mg by mouth 2 (two) times daily.    allopurinol (ZYLOPRIM) 100 MG tablet Take 100 mg by mouth daily.    aspirin 81 MG tablet Take 81 mg by mouth daily.    calcitRIOL (ROCALTROL)  0.25 MCG capsule Take 0.25 mcg by mouth daily.    levothyroxine (SYNTHROID, LEVOTHROID) 25 MCG tablet Take 25 mcg by mouth daily before breakfast.    magnesium oxide (MAG-OX) 400 MG tablet Take 400 mg by mouth daily.    omeprazole (PRILOSEC) 20 MG capsule Take 20 mg by mouth daily.    predniSONE (DELTASONE) 5 MG tablet Take 5 mg by mouth daily with breakfast.    rosuvastatin (CRESTOR) 20 MG tablet Take 20 mg by mouth daily.      STOP taking these medications     gabapentin (NEURONTIN) 100 MG capsule      cefUROXime (CEFTIN) 500 MG tablet         ALLERGIES:   Allergies  Allergen Reactions  . Amoxicillin     NAUSEATED  . Codeine     VOMITTING    BRIEF HPI:  See H&P, Labs, Consult and Test reports for all details in brief, patient is a 81 y.o. female history of renal transplant, chronic kidney disease, diabetes mellitus, hypertension, hypothyroidism, peripheral vascular disease was brought to the ER for evaluation of confusion.  CONSULTATIONS:   None  PERTINENT RADIOLOGIC STUDIES: Dg Chest 2 View  01/15/2015   CLINICAL DATA:  Patient found unresponsive.  Initial encounter.  EXAM: CHEST  2 VIEW  COMPARISON:  Chest radiograph performed 12/18/2014  FINDINGS: The lungs are hypoexpanded. Vascular crowding and vascular congestion are seen. Left basilar  airspace opacity may reflect atelectasis or pneumonia. No pleural effusion or pneumothorax is seen.  The heart is borderline normal in size. No acute osseous abnormalities are seen. Diffuse calcification is seen along the abdominal aorta.  IMPRESSION: Lungs hypoexpanded. Vascular congestion noted. Left basilar airspace opacity may reflect atelectasis or pneumonia.   Electronically Signed   By: Roanna Raider M.D.   On: 01/15/2015 02:12   Dg Pelvis 1-2 Views  01/15/2015   CLINICAL DATA:  Fall, confusion  EXAM: PELVIS - 1-2 VIEW  COMPARISON:  None.  FINDINGS: Single frontal view of the pelvis submitted. Diffuse osteopenia. No acute  fracture or subluxation. Atherosclerotic calcifications of iliac and femoral arteries.  IMPRESSION: Negative.   Electronically Signed   By: Natasha Mead M.D.   On: 01/15/2015 08:28   Ct Head Wo Contrast  01/15/2015   CLINICAL DATA:  Patient found unresponsive. Acute encephalopathy. Initial encounter.  EXAM: CT HEAD WITHOUT CONTRAST  TECHNIQUE: Contiguous axial images were obtained from the base of the skull through the vertex without intravenous contrast.  COMPARISON:  CT of the head performed 01/11/2010  FINDINGS: There is no evidence of acute infarction, mass lesion, or intra- or extra-axial hemorrhage on CT.  Scattered periventricular and subcortical white matter change likely reflects small vessel ischemic microangiopathy. Chronic lacunar infarcts are seen at the left basal ganglia. A chronic lacunar infarct is noted at the right cerebellar hemisphere.  The posterior fossa, including the cerebellum, brainstem and fourth ventricle, is within normal limits. The third and lateral ventricles, and basal ganglia are unremarkable in appearance. The cerebral hemispheres are symmetric in appearance, with normal gray-white differentiation. No mass effect or midline shift is seen.  There is no evidence of fracture; visualized osseous structures are unremarkable in appearance. The orbits are within normal limits. There is opacification of the left maxillary sinus. The remaining paranasal sinuses and mastoid air cells are well-aerated. No significant soft tissue abnormalities are seen.  IMPRESSION: 1. No acute intracranial pathology seen on CT. 2. Scattered small vessel ischemic microangiopathy. 3. Chronic lacunar infarcts at the left basal ganglia, and chronic lacunar infarct at the right cerebellar hemisphere. 4. Opacification of the left maxillary sinus.   Electronically Signed   By: Roanna Raider M.D.   On: 01/15/2015 05:20     PERTINENT LAB RESULTS: CBC:  Recent Labs  01/16/15 0655 01/17/15 0700  WBC 12.3*  10.9*  HGB 8.3* 8.9*  HCT 27.1* 28.2*  PLT 149* 179   CMET CMP     Component Value Date/Time   NA 143 01/18/2015 0735   K 4.2 01/18/2015 0735   CL 114* 01/18/2015 0735   CO2 18* 01/18/2015 0735   GLUCOSE 69 01/18/2015 0735   BUN 69* 01/18/2015 0735   CREATININE 4.21* 01/18/2015 0735   CALCIUM 9.3 01/18/2015 0735   CALCIUM 9.1 01/01/2008 1010   PROT 4.8* 01/15/2015 0630   ALBUMIN 2.4* 01/16/2015 0656   AST 20 01/15/2015 0630   ALT 9* 01/15/2015 0630   ALKPHOS 44 01/15/2015 0630   BILITOT 1.0 01/15/2015 0630   GFRNONAA 9* 01/18/2015 0735   GFRAA 11* 01/18/2015 0735    GFR Estimated Creatinine Clearance: 9.5 mL/min (by C-G formula based on Cr of 4.21). No results for input(s): LIPASE, AMYLASE in the last 72 hours. No results for input(s): CKTOTAL, CKMB, CKMBINDEX, TROPONINI in the last 72 hours. Invalid input(s): POCBNP No results for input(s): DDIMER in the last 72 hours. No results for input(s): HGBA1C in the last 72  hours. No results for input(s): CHOL, HDL, LDLCALC, TRIG, CHOLHDL, LDLDIRECT in the last 72 hours. No results for input(s): TSH, T4TOTAL, T3FREE, THYROIDAB in the last 72 hours.  Invalid input(s): FREET3 No results for input(s): VITAMINB12, FOLATE, FERRITIN, TIBC, IRON, RETICCTPCT in the last 72 hours. Coags: No results for input(s): INR in the last 72 hours.  Invalid input(s): PT Microbiology: Recent Results (from the past 240 hour(s))  Blood Culture (routine x 2)     Status: None   Collection Time: 01/15/15 12:38 AM  Result Value Ref Range Status   Specimen Description BLOOD RIGHT ARM  Final   Special Requests BOTTLES DRAWN AEROBIC ONLY 5 CC  Final   Culture  Setup Time   Final    GRAM NEGATIVE RODS AEROBIC BOTTLE ONLY CRITICAL RESULT CALLED TO, READ BACK BY AND VERIFIED WITH: E GARNETT RN 2101 01/16/15 A BROWNING    Culture ESCHERICHIA COLI  Final   Report Status 01/18/2015 FINAL  Final   Organism ID, Bacteria ESCHERICHIA COLI  Final       Susceptibility   Escherichia coli - MIC*    AMPICILLIN >=32 RESISTANT Resistant     CEFAZOLIN 16 SENSITIVE Sensitive     CEFEPIME <=1 SENSITIVE Sensitive     CEFTAZIDIME <=1 SENSITIVE Sensitive     CEFTRIAXONE <=1 SENSITIVE Sensitive     CIPROFLOXACIN >=4 RESISTANT Resistant     GENTAMICIN <=1 SENSITIVE Sensitive     IMIPENEM <=0.25 SENSITIVE Sensitive     TRIMETH/SULFA <=20 SENSITIVE Sensitive     AMPICILLIN/SULBACTAM >=32 RESISTANT Resistant     PIP/TAZO <=4 SENSITIVE Sensitive     * ESCHERICHIA COLI  Urine culture     Status: None   Collection Time: 01/15/15  1:01 AM  Result Value Ref Range Status   Specimen Description URINE, CATHETERIZED  Final   Special Requests NONE  Final   Culture >=100,000 COLONIES/mL ESCHERICHIA COLI  Final   Report Status 01/17/2015 FINAL  Final   Organism ID, Bacteria ESCHERICHIA COLI  Final      Susceptibility   Escherichia coli - MIC*    AMPICILLIN >=32 RESISTANT Resistant     CEFAZOLIN <=4 SENSITIVE Sensitive     CEFTRIAXONE <=1 SENSITIVE Sensitive     CIPROFLOXACIN >=4 RESISTANT Resistant     GENTAMICIN <=1 SENSITIVE Sensitive     IMIPENEM <=0.25 SENSITIVE Sensitive     NITROFURANTOIN <=16 SENSITIVE Sensitive     TRIMETH/SULFA <=20 SENSITIVE Sensitive     AMPICILLIN/SULBACTAM 16 INTERMEDIATE Intermediate     PIP/TAZO <=4 SENSITIVE Sensitive     * >=100,000 COLONIES/mL ESCHERICHIA COLI  Culture, blood (routine x 2)     Status: None (Preliminary result)   Collection Time: 01/15/15  9:36 AM  Result Value Ref Range Status   Specimen Description BLOOD RIGHT ANTECUBITAL  Final   Special Requests BOTTLES DRAWN AEROBIC AND ANAEROBIC 5CC SET 2  Final   Culture NO GROWTH 2 DAYS  Final   Report Status PENDING  Incomplete     BRIEF HOSPITAL COURSE:  Acute encephalopathy:Resolved. Secondary to UTI with sepsis. CT head negative for acute abnormalities.   UTI with sepsis: Resolved, treated with IV Zosyn-urine culture positive for E Coli, blood  culture X 1 positive for E Coli-discussed with Dr Zenaida Niece Dam-who reviewed culture data, he suggested a total of 14 days of Keflex. Discussed with pharmacy-recommendations are for Keflex 250 mg BID. .  E Coli bacteremia:from UTI-see above. Please repeat blood cultures once patient has completed  Abx treatment.   Acute on chronic kidney disease stage IV with history of renal transplant: Mild worsening of creatinine, suspect likely secondary to prerenal azotemia in the setting of sepsis/UTI. Creatinine slowly down trending. Per family and patient-She does not want to be placed back on hemodialysis if and when kidney failure becomes ESRD  Anemia: No indication of blood loss-worsening hemoglobin likely secondary to acute illness/sepsis and CKD. Follow closely as outpatient.   History of renal transplant: Continue Prograf and steroids-temporarily treated with stress dose hydrocortisone-tapered back to usual dosing of prednisone. Spoke with Dr Briant Cedar on 8/13-recommended to recheck prograf trough level in am as random level low-which is pending-please follow.  Type 2 diabetes: CBGs stable, continue 15 units of Lantus on discharge  Hypothyroidism: Continue with levothyroxine  Dyslipidemia: Continue with statin  Hypertension: Continue Labetalol-further optimization deferred to the outpatient setting.   Non-severe (moderate) malnutrition in context of chronic illness   TODAY-DAY OF DISCHARGE:  Subjective:   April Casey today has no headache,no chest abdominal pain,no new weakness tingling or numbness, feels much better wants to go home today.   Objective:   Blood pressure 160/64, pulse 70, temperature 98.3 F (36.8 C), temperature source Oral, resp. rate 18, height 5\' 3"  (1.6 m), weight 63.2 kg (139 lb 5.3 oz), SpO2 98 %.  Intake/Output Summary (Last 24 hours) at 01/18/15 1243 Last data filed at 01/18/15 0700  Gross per 24 hour  Intake    780 ml  Output      0 ml  Net    780 ml   Filed  Weights   01/16/15 0516 01/17/15 0533 01/18/15 0522  Weight: 62 kg (136 lb 11 oz) 63.5 kg (139 lb 15.9 oz) 63.2 kg (139 lb 5.3 oz)    Exam Awake Alert, Oriented *3, No new F.N deficits, Normal affect Jud.AT,PERRAL Supple Neck,No JVD, No cervical lymphadenopathy appriciated.  Symmetrical Chest wall movement, Good air movement bilaterally, CTAB RRR,No Gallops,Rubs or new Murmurs, No Parasternal Heave +ve B.Sounds, Abd Soft, Non tender, No organomegaly appriciated, No rebound -guarding or rigidity. No Cyanosis, Clubbing or edema, No new Rash or bruise  DISCHARGE CONDITION: Stable  DISPOSITION: Home  DISCHARGE INSTRUCTIONS:    Activity:  As tolerated   Diet recommendation: Diabetic Diet Heart Healthy diet  Discharge Instructions    Call MD for:  temperature >100.4    Complete by:  As directed      Diet - low sodium heart healthy    Complete by:  As directed      Increase activity slowly    Complete by:  As directed            Follow-up Information    Follow up with Cala Bradford, MD. Schedule an appointment as soon as possible for a visit in 1 week.   Specialty:  Family Medicine   Contact information:   [phone] [address] [place] [address] [phone]       Follow up with DUNHAM,CYNTHIA B, MD. Schedule an appointment as soon as possible for a visit in 1 week.   Specialty:  Nephrology   Contact information:   [address] [place] [address] [phone]       Total Time spent on discharge equals 45 minutes.  SignedJeoffrey Massed 01/18/2015 12:43 PM

## 2015-01-18 NOTE — Progress Notes (Signed)
Patient discharge teaching given, including activity, diet, follow-up appoints, and medications. Patient and daughter verbalized understanding of all discharge instructions. IV access was d/c'd. Vitals are stable. Skin is intact except as charted in most recent assessments. Pt to be escorted out by NT, to be driven home by family. 

## 2015-01-19 LAB — CULTURE, BLOOD (ROUTINE X 2)

## 2015-01-20 LAB — TACROLIMUS LEVEL: TACROLIMUS (FK506) - LABCORP: 3.4 ng/mL (ref 2.0–20.0)

## 2015-01-20 LAB — CULTURE, BLOOD (ROUTINE X 2): Culture: NO GROWTH

## 2015-01-29 ENCOUNTER — Other Ambulatory Visit (HOSPITAL_COMMUNITY): Payer: Self-pay | Admitting: *Deleted

## 2015-01-30 ENCOUNTER — Inpatient Hospital Stay (HOSPITAL_COMMUNITY): Admission: RE | Admit: 2015-01-30 | Payer: BC Managed Care – PPO | Source: Ambulatory Visit

## 2015-02-06 ENCOUNTER — Encounter (HOSPITAL_COMMUNITY)
Admission: RE | Admit: 2015-02-06 | Discharge: 2015-02-06 | Disposition: A | Discharge status: Home / Self Care | Payer: Medicare Other | Source: Ambulatory Visit | Attending: Nephrology | Admitting: Nephrology

## 2015-02-06 DIAGNOSIS — Z79899 Other long term (current) drug therapy: Secondary | ICD-10-CM | POA: Diagnosis not present

## 2015-02-06 DIAGNOSIS — D631 Anemia in chronic kidney disease: Secondary | ICD-10-CM | POA: Diagnosis not present

## 2015-02-06 DIAGNOSIS — N184 Chronic kidney disease, stage 4 (severe): Secondary | ICD-10-CM | POA: Diagnosis present

## 2015-02-06 DIAGNOSIS — Z5181 Encounter for therapeutic drug level monitoring: Secondary | ICD-10-CM | POA: Diagnosis not present

## 2015-02-06 MED ORDER — DARBEPOETIN ALFA 200 MCG/0.4ML IJ SOSY: 200.0000 ug | PREFILLED_SYRINGE | INTRAMUSCULAR | Status: DC

## 2015-02-11 ENCOUNTER — Encounter (HOSPITAL_COMMUNITY)
Admission: RE | Admit: 2015-02-11 | Discharge: 2015-02-11 | Disposition: A | Discharge status: Home / Self Care | Payer: Medicare Other | Source: Ambulatory Visit | Attending: Nephrology | Admitting: Nephrology

## 2015-02-11 DIAGNOSIS — N184 Chronic kidney disease, stage 4 (severe): Secondary | ICD-10-CM | POA: Diagnosis not present

## 2015-02-11 LAB — IRON AND TIBC
IRON: 82 ug/dL (ref 28–170)
Saturation Ratios: 32 % — ABNORMAL HIGH (ref 10.4–31.8)
TIBC: 258 ug/dL (ref 250–450)
UIBC: 176 ug/dL

## 2015-02-11 LAB — FERRITIN: FERRITIN: 834 ng/mL — AB (ref 11–307)

## 2015-02-11 MED ORDER — DARBEPOETIN ALFA 200 MCG/0.4ML IJ SOSY
PREFILLED_SYRINGE | INTRAMUSCULAR | Status: AC
Start: 2015-02-11 — End: 2015-02-11
  Administered 2015-02-11: 200 ug via SUBCUTANEOUS
  Filled 2015-02-11: qty 0.4

## 2015-02-11 MED ORDER — DARBEPOETIN ALFA 200 MCG/0.4ML IJ SOSY
200.0000 ug | PREFILLED_SYRINGE | INTRAMUSCULAR | Status: DC
  Administered 2015-02-11: 200 ug via SUBCUTANEOUS

## 2015-02-11 NOTE — Progress Notes (Signed)
Order written from 01/30/2015 to draw a tacro level on pt.  Pt has been IP so when she came today for her injection in medical daycare I asked her if she took her prograf today and she stated she had.  Unable to draw tacro level due to pt taking prograf today.  Notified [facility].

## 2015-02-19 ENCOUNTER — Ambulatory Visit (HOSPITAL_COMMUNITY)
Admission: RE | Admit: 2015-02-19 | Discharge: 2015-02-19 | Disposition: A | Discharge status: Home / Self Care | Payer: Medicare Other | Source: Ambulatory Visit | Attending: Family Medicine | Admitting: Family Medicine

## 2015-02-19 DIAGNOSIS — Z1231 Encounter for screening mammogram for malignant neoplasm of breast: Secondary | ICD-10-CM | POA: Diagnosis not present

## 2015-03-13 ENCOUNTER — Ambulatory Visit
Admission: RE | Admit: 2015-03-13 | Discharge: 2015-03-13 | Disposition: A | Discharge status: Home / Self Care | Payer: BC Managed Care – PPO | Source: Ambulatory Visit | Attending: Neurology | Admitting: Neurology

## 2015-03-13 ENCOUNTER — Encounter (HOSPITAL_COMMUNITY)
Admission: RE | Admit: 2015-03-13 | Discharge: 2015-03-13 | Disposition: A | Discharge status: Home / Self Care | Payer: Medicare Other | Source: Ambulatory Visit | Attending: Nephrology | Admitting: Nephrology

## 2015-03-13 ENCOUNTER — Ambulatory Visit
Admission: RE | Admit: 2015-03-13 | Discharge: 2015-03-13 | Disposition: A | Discharge status: Home / Self Care | Payer: BC Managed Care – PPO | Source: Ambulatory Visit | Attending: Family Medicine | Admitting: Family Medicine

## 2015-03-13 DIAGNOSIS — Z5181 Encounter for therapeutic drug level monitoring: Secondary | ICD-10-CM | POA: Insufficient documentation

## 2015-03-13 DIAGNOSIS — N184 Chronic kidney disease, stage 4 (severe): Secondary | ICD-10-CM | POA: Diagnosis not present

## 2015-03-13 DIAGNOSIS — Z79899 Other long term (current) drug therapy: Secondary | ICD-10-CM | POA: Diagnosis not present

## 2015-03-13 DIAGNOSIS — H5310 Unspecified subjective visual disturbances: Secondary | ICD-10-CM

## 2015-03-13 DIAGNOSIS — D631 Anemia in chronic kidney disease: Secondary | ICD-10-CM | POA: Insufficient documentation

## 2015-03-13 DIAGNOSIS — R42 Dizziness and giddiness: Secondary | ICD-10-CM

## 2015-03-13 DIAGNOSIS — I951 Orthostatic hypotension: Secondary | ICD-10-CM

## 2015-03-13 DIAGNOSIS — R55 Syncope and collapse: Secondary | ICD-10-CM

## 2015-03-13 DIAGNOSIS — M79604 Pain in right leg: Secondary | ICD-10-CM

## 2015-03-13 LAB — IRON AND TIBC
IRON: 69 ug/dL (ref 28–170)
SATURATION RATIOS: 27 % (ref 10.4–31.8)
TIBC: 256 ug/dL (ref 250–450)
UIBC: 187 ug/dL

## 2015-03-13 LAB — POCT HEMOGLOBIN-HEMACUE: Hemoglobin: 9.5 g/dL — ABNORMAL LOW (ref 12.0–15.0)

## 2015-03-13 LAB — FERRITIN: FERRITIN: 673 ng/mL — AB (ref 11–307)

## 2015-03-13 MED ORDER — DARBEPOETIN ALFA 200 MCG/0.4ML IJ SOSY
PREFILLED_SYRINGE | INTRAMUSCULAR | Status: AC
  Filled 2015-03-13: qty 0.4

## 2015-03-13 MED ORDER — DARBEPOETIN ALFA 200 MCG/0.4ML IJ SOSY
200.0000 ug | PREFILLED_SYRINGE | INTRAMUSCULAR | Status: DC
  Administered 2015-03-13: 200 ug via SUBCUTANEOUS

## 2015-03-16 ENCOUNTER — Telehealth: Payer: Self-pay | Admitting: Neurology

## 2015-03-16 NOTE — Telephone Encounter (Signed)
  I called patient. The MRI the brain shows a mild degree of small vessel disease, some progression since 2010. MRA of the head is unremarkable with exception that the distal vertebral artery ends with a PICA, and the distal portion of this vessel appears to be disease. This could cause some dizziness, but it should not lead to any the visual dimming that the patient has reported. The patient indicates that the dizziness and the visual dimming have improved recently. Her blood sugars are under good control. She has been noted to have some orthostatic hypotension previously. She is on low-dose aspirin.  MRI brain 03/16/2015:  IMPRESSION:  This is an abnormal MRI of the brain without contrast showed the following: 1. Lacunar infarctions in the cerebellar hemispheres, left putamen and adjacent anterior internal capsule, and right anterior internal capsule. These all appear chronic. The left cerebellar hemisphere lacunar infarction was not noted on MRI dated 06/20/2008. 2. Moderate age-related chronic microvascular ischemic changes, slightly progressed when compared to the MRI from 06/20/2008. Additionally, the mild cortical atrophy has slightly progressed. 3, Mild left maxillary chronic sinusitis. 4. There are no acute findings.   MRA head 03/16/2015:  IMPRESSION: This is an abnormal MR angiogram of the intracranial arteries showing the following: 1. Mild non-hemodynamicly significant stenosis in the M1 segments of the middle cerebral arteries, worse on the left. Additionally one of the M2 segments on the left shows mild stenosis that is not hemodynamically significant. The MR angiogram 06/20/2008 showed much more stenosis of the right M1 segment than is seen on the current study 2. Mild nonhemodynamically significant since of the A2 segment of the right anterior cerebral artery. 3. Severe stenosis that could be hemodynamically significant involving the right vertebral artery. Distal  to the stenosis, the right PICA appears normal. The right vertebral artery does not contribute significant flow to the basilar artery. Prior MR angiogram did not adequately cover the V1 and V2 segment of the vertebral arteries and comparison is not possible.

## 2015-03-19 ENCOUNTER — Ambulatory Visit (INDEPENDENT_AMBULATORY_CARE_PROVIDER_SITE_OTHER): Payer: Medicare Other | Admitting: Podiatry

## 2015-03-19 ENCOUNTER — Ambulatory Visit (INDEPENDENT_AMBULATORY_CARE_PROVIDER_SITE_OTHER): Payer: Medicare Other

## 2015-03-19 ENCOUNTER — Encounter: Payer: Self-pay | Admitting: Podiatry

## 2015-03-19 ENCOUNTER — Telehealth: Payer: Self-pay | Admitting: *Deleted

## 2015-03-19 VITALS — BP 155/73 | HR 76 | Resp 16

## 2015-03-19 DIAGNOSIS — E1151 Type 2 diabetes mellitus with diabetic peripheral angiopathy without gangrene: Secondary | ICD-10-CM | POA: Diagnosis not present

## 2015-03-19 DIAGNOSIS — M79671 Pain in right foot: Secondary | ICD-10-CM | POA: Diagnosis not present

## 2015-03-19 DIAGNOSIS — M779 Enthesopathy, unspecified: Secondary | ICD-10-CM | POA: Diagnosis not present

## 2015-03-19 DIAGNOSIS — R0989 Other specified symptoms and signs involving the circulatory and respiratory systems: Secondary | ICD-10-CM

## 2015-03-19 MED ORDER — TRIAMCINOLONE ACETONIDE 10 MG/ML IJ SUSP
10.0000 mg | Freq: Once | INTRAMUSCULAR | Status: AC
  Administered 2015-03-19: 10 mg

## 2015-03-19 NOTE — Progress Notes (Signed)
   Subjective:    Patient ID: April Casey, female    DOB: 1931/12/26, 81 y.o.   MRN: [id number]  HPI Pt presents with pain in the right foot and swelling, ongoing problem for 2 years now. She has a h/o of gout and is currently on medicine. Pain comes and goes and sometimes starts in knees and radiates toward foot, pain is genralized all over her foot   Review of Systems  Musculoskeletal: Positive for myalgias, back pain, arthralgias and gait problem.  All other systems reviewed and are negative.      Objective:   Physical Exam        Assessment & Plan:

## 2015-03-20 ENCOUNTER — Other Ambulatory Visit: Payer: Self-pay | Admitting: Podiatry

## 2015-03-20 DIAGNOSIS — R0989 Other specified symptoms and signs involving the circulatory and respiratory systems: Secondary | ICD-10-CM

## 2015-03-20 NOTE — Telephone Encounter (Signed)
Entered in error

## 2015-03-20 NOTE — Progress Notes (Signed)
Subjective:     Patient ID: April Casey, female   DOB: [birth year], 81 y.o.   MRN: [id number]  HPI patient states I've had increase in right foot pain and swelling and into my right lower leg and it hurts mostly in my ankle but my whole foot hurts and it's hard for me to walk or do any types of activities   Review of Systems  All other systems reviewed and are negative.      Objective:   Physical Exam  Constitutional: She is oriented to person, place, and time.  Musculoskeletal: Normal range of motion.  Neurological: She is oriented to person, place, and time.  Skin: Skin is warm and dry.  Nursing note and vitals reviewed.  vascular status found to be diminished quite significantly both PT and DP pulses and patient is noted to have some coolness to the right foot over left foot and does have edema in the right foot nonpitting in nature and quite a bit of discomfort in the right ankle with inflammation extending from this area. Patient has reduced digital perfusion and has toes that are cold to touch     Assessment:     Very strong possibility for vascular compromise leading to swelling and pain patient is experiencing    Plan:     Patient and caregiver are not good historians and it is difficult to tell whether or not she's had her circulation checked. I was unable to find any indication of this in her records and at this time as a precautionary measure I am sending her for vascular evaluation to determine whether or not there may be vascular disease right over left. I then went ahead and I injected the sinus tarsi right 3 mg Kenalog 5 mg Xylocaine and I advised on reduced activity and soaks. Patient will be seen back and we will get the results of the PVD studies

## 2015-03-26 ENCOUNTER — Ambulatory Visit (HOSPITAL_COMMUNITY)
Admission: RE | Admit: 2015-03-26 | Discharge: 2015-03-26 | Disposition: A | Discharge status: Home / Self Care | Payer: Medicare Other | Source: Ambulatory Visit | Attending: Cardiology | Admitting: Cardiology

## 2015-03-26 DIAGNOSIS — E119 Type 2 diabetes mellitus without complications: Secondary | ICD-10-CM | POA: Diagnosis not present

## 2015-03-26 DIAGNOSIS — I10 Essential (primary) hypertension: Secondary | ICD-10-CM | POA: Diagnosis not present

## 2015-03-26 DIAGNOSIS — I739 Peripheral vascular disease, unspecified: Secondary | ICD-10-CM | POA: Diagnosis not present

## 2015-03-26 DIAGNOSIS — Z8673 Personal history of transient ischemic attack (TIA), and cerebral infarction without residual deficits: Secondary | ICD-10-CM | POA: Insufficient documentation

## 2015-03-26 DIAGNOSIS — E785 Hyperlipidemia, unspecified: Secondary | ICD-10-CM | POA: Diagnosis not present

## 2015-03-26 DIAGNOSIS — R0989 Other specified symptoms and signs involving the circulatory and respiratory systems: Secondary | ICD-10-CM | POA: Diagnosis not present

## 2015-04-06 ENCOUNTER — Other Ambulatory Visit: Payer: Self-pay | Admitting: Podiatry

## 2015-04-06 DIAGNOSIS — I700 Atherosclerosis of aorta: Secondary | ICD-10-CM

## 2015-04-09 ENCOUNTER — Ambulatory Visit (HOSPITAL_COMMUNITY)
Admission: RE | Admit: 2015-04-09 | Discharge: 2015-04-09 | Disposition: A | Discharge status: Home / Self Care | Payer: Medicare Other | Source: Ambulatory Visit | Attending: Cardiovascular Disease | Admitting: Cardiovascular Disease

## 2015-04-09 DIAGNOSIS — I708 Atherosclerosis of other arteries: Secondary | ICD-10-CM | POA: Diagnosis not present

## 2015-04-09 DIAGNOSIS — E785 Hyperlipidemia, unspecified: Secondary | ICD-10-CM | POA: Diagnosis not present

## 2015-04-09 DIAGNOSIS — I700 Atherosclerosis of aorta: Secondary | ICD-10-CM | POA: Diagnosis not present

## 2015-04-09 DIAGNOSIS — E119 Type 2 diabetes mellitus without complications: Secondary | ICD-10-CM | POA: Diagnosis not present

## 2015-04-09 DIAGNOSIS — N189 Chronic kidney disease, unspecified: Secondary | ICD-10-CM | POA: Insufficient documentation

## 2015-04-09 DIAGNOSIS — I129 Hypertensive chronic kidney disease with stage 1 through stage 4 chronic kidney disease, or unspecified chronic kidney disease: Secondary | ICD-10-CM | POA: Insufficient documentation

## 2015-04-10 ENCOUNTER — Encounter (HOSPITAL_COMMUNITY)
Admission: RE | Admit: 2015-04-10 | Discharge: 2015-04-10 | Disposition: A | Discharge status: Home / Self Care | Payer: Medicare Other | Source: Ambulatory Visit | Attending: Nephrology | Admitting: Nephrology

## 2015-04-10 DIAGNOSIS — N184 Chronic kidney disease, stage 4 (severe): Secondary | ICD-10-CM | POA: Insufficient documentation

## 2015-04-10 DIAGNOSIS — Z79899 Other long term (current) drug therapy: Secondary | ICD-10-CM | POA: Diagnosis not present

## 2015-04-10 DIAGNOSIS — Z5181 Encounter for therapeutic drug level monitoring: Secondary | ICD-10-CM | POA: Insufficient documentation

## 2015-04-10 DIAGNOSIS — D631 Anemia in chronic kidney disease: Secondary | ICD-10-CM | POA: Insufficient documentation

## 2015-04-10 LAB — IRON AND TIBC
Iron: 68 ug/dL (ref 28–170)
Saturation Ratios: 26 % (ref 10.4–31.8)
TIBC: 258 ug/dL (ref 250–450)
UIBC: 190 ug/dL

## 2015-04-10 LAB — FERRITIN: Ferritin: 891 ng/mL — ABNORMAL HIGH (ref 11–307)

## 2015-04-10 LAB — POCT HEMOGLOBIN-HEMACUE: HEMOGLOBIN: 11.4 g/dL — AB (ref 12.0–15.0)

## 2015-04-10 MED ORDER — DARBEPOETIN ALFA 200 MCG/0.4ML IJ SOSY
200.0000 ug | PREFILLED_SYRINGE | INTRAMUSCULAR | Status: DC
  Administered 2015-04-10: 200 ug via SUBCUTANEOUS

## 2015-04-10 MED ORDER — DARBEPOETIN ALFA 200 MCG/0.4ML IJ SOSY
PREFILLED_SYRINGE | INTRAMUSCULAR | Status: AC
  Filled 2015-04-10: qty 0.4

## 2015-05-08 ENCOUNTER — Encounter (HOSPITAL_COMMUNITY)
Admission: RE | Admit: 2015-05-08 | Discharge: 2015-05-08 | Disposition: A | Discharge status: Home / Self Care | Payer: Medicare Other | Source: Ambulatory Visit | Attending: Nephrology | Admitting: Nephrology

## 2015-05-08 DIAGNOSIS — D631 Anemia in chronic kidney disease: Secondary | ICD-10-CM | POA: Insufficient documentation

## 2015-05-08 DIAGNOSIS — Z79899 Other long term (current) drug therapy: Secondary | ICD-10-CM | POA: Insufficient documentation

## 2015-05-08 DIAGNOSIS — Z5181 Encounter for therapeutic drug level monitoring: Secondary | ICD-10-CM | POA: Insufficient documentation

## 2015-05-08 DIAGNOSIS — N184 Chronic kidney disease, stage 4 (severe): Secondary | ICD-10-CM | POA: Insufficient documentation

## 2015-05-08 LAB — POCT HEMOGLOBIN-HEMACUE: Hemoglobin: 11 g/dL — ABNORMAL LOW (ref 12.0–15.0)

## 2015-05-08 LAB — IRON AND TIBC
IRON: 77 ug/dL (ref 28–170)
Saturation Ratios: 31 % (ref 10.4–31.8)
TIBC: 245 ug/dL — ABNORMAL LOW (ref 250–450)
UIBC: 168 ug/dL

## 2015-05-08 LAB — FERRITIN: FERRITIN: 755 ng/mL — AB (ref 11–307)

## 2015-05-08 MED ORDER — DARBEPOETIN ALFA 200 MCG/0.4ML IJ SOSY
200.0000 ug | PREFILLED_SYRINGE | INTRAMUSCULAR | Status: DC
  Administered 2015-05-08: 200 ug via SUBCUTANEOUS

## 2015-05-08 MED ORDER — DARBEPOETIN ALFA 200 MCG/0.4ML IJ SOSY
PREFILLED_SYRINGE | INTRAMUSCULAR | Status: AC
  Filled 2015-05-08: qty 0.4

## 2015-05-11 ENCOUNTER — Telehealth: Payer: Self-pay | Admitting: *Deleted

## 2015-05-11 NOTE — Telephone Encounter (Signed)
Marie [facility] ATTN:  Misty StanleyLisa [phone].  Faxed abdominal aorta test 04/09/2015, lower arterial 03/26/2015 2 pages.

## 2015-06-05 ENCOUNTER — Inpatient Hospital Stay (HOSPITAL_COMMUNITY): Admission: RE | Admit: 2015-06-05 | Payer: Medicare Other | Source: Ambulatory Visit

## 2015-06-17 ENCOUNTER — Telehealth: Payer: Self-pay | Admitting: Neurology

## 2015-06-17 NOTE — Telephone Encounter (Signed)
Patient did not pick up Xanax - Xanax has gone back to file.

## 2015-06-20 ENCOUNTER — Emergency Department (HOSPITAL_COMMUNITY)
Admission: EM | Admit: 2015-06-20 | Discharge: 2015-06-21 | Disposition: A | Discharge status: Home / Self Care | Payer: Medicare Other | Attending: Emergency Medicine | Admitting: Emergency Medicine

## 2015-06-20 ENCOUNTER — Encounter (HOSPITAL_COMMUNITY): Payer: Self-pay | Admitting: Emergency Medicine

## 2015-06-20 DIAGNOSIS — Z7982 Long term (current) use of aspirin: Secondary | ICD-10-CM | POA: Insufficient documentation

## 2015-06-20 DIAGNOSIS — Z9889 Other specified postprocedural states: Secondary | ICD-10-CM | POA: Diagnosis not present

## 2015-06-20 DIAGNOSIS — R2241 Localized swelling, mass and lump, right lower limb: Secondary | ICD-10-CM | POA: Insufficient documentation

## 2015-06-20 DIAGNOSIS — Z79899 Other long term (current) drug therapy: Secondary | ICD-10-CM | POA: Diagnosis not present

## 2015-06-20 DIAGNOSIS — Z862 Personal history of diseases of the blood and blood-forming organs and certain disorders involving the immune mechanism: Secondary | ICD-10-CM | POA: Diagnosis not present

## 2015-06-20 DIAGNOSIS — Z792 Long term (current) use of antibiotics: Secondary | ICD-10-CM | POA: Insufficient documentation

## 2015-06-20 DIAGNOSIS — Z880 Allergy status to penicillin: Secondary | ICD-10-CM | POA: Insufficient documentation

## 2015-06-20 DIAGNOSIS — Z794 Long term (current) use of insulin: Secondary | ICD-10-CM | POA: Insufficient documentation

## 2015-06-20 DIAGNOSIS — M79674 Pain in right toe(s): Secondary | ICD-10-CM

## 2015-06-20 DIAGNOSIS — I129 Hypertensive chronic kidney disease with stage 1 through stage 4 chronic kidney disease, or unspecified chronic kidney disease: Secondary | ICD-10-CM | POA: Diagnosis not present

## 2015-06-20 DIAGNOSIS — E119 Type 2 diabetes mellitus without complications: Secondary | ICD-10-CM | POA: Diagnosis not present

## 2015-06-20 DIAGNOSIS — Z95828 Presence of other vascular implants and grafts: Secondary | ICD-10-CM

## 2015-06-20 DIAGNOSIS — Z8673 Personal history of transient ischemic attack (TIA), and cerebral infarction without residual deficits: Secondary | ICD-10-CM | POA: Insufficient documentation

## 2015-06-20 DIAGNOSIS — Z7952 Long term (current) use of systemic steroids: Secondary | ICD-10-CM | POA: Insufficient documentation

## 2015-06-20 DIAGNOSIS — M7989 Other specified soft tissue disorders: Secondary | ICD-10-CM

## 2015-06-20 DIAGNOSIS — N189 Chronic kidney disease, unspecified: Secondary | ICD-10-CM | POA: Insufficient documentation

## 2015-06-20 DIAGNOSIS — M109 Gout, unspecified: Secondary | ICD-10-CM | POA: Insufficient documentation

## 2015-06-20 DIAGNOSIS — E785 Hyperlipidemia, unspecified: Secondary | ICD-10-CM | POA: Diagnosis not present

## 2015-06-20 LAB — COMPREHENSIVE METABOLIC PANEL
ALK PHOS: 91 U/L (ref 38–126)
ALT: 14 U/L (ref 14–54)
ANION GAP: 11 (ref 5–15)
AST: 19 U/L (ref 15–41)
Albumin: 3.3 g/dL — ABNORMAL LOW (ref 3.5–5.0)
BUN: 37 mg/dL — ABNORMAL HIGH (ref 6–20)
CALCIUM: 9.3 mg/dL (ref 8.9–10.3)
CO2: 27 mmol/L (ref 22–32)
Chloride: 101 mmol/L (ref 101–111)
Creatinine, Ser: 3 mg/dL — ABNORMAL HIGH (ref 0.44–1.00)
GFR calc non Af Amer: 14 mL/min — ABNORMAL LOW (ref >60)
GFR, EST AFRICAN AMERICAN: 16 mL/min — AB (ref >60)
Glucose, Bld: 442 mg/dL — ABNORMAL HIGH (ref 65–99)
Potassium: 4.3 mmol/L (ref 3.5–5.1)
SODIUM: 139 mmol/L (ref 135–145)
TOTAL PROTEIN: 5.9 g/dL — AB (ref 6.5–8.1)
Total Bilirubin: 0.5 mg/dL (ref 0.3–1.2)

## 2015-06-20 LAB — CBC
HEMATOCRIT: 30.4 % — AB (ref 36.0–46.0)
Hemoglobin: 9.2 g/dL — ABNORMAL LOW (ref 12.0–15.0)
MCH: 27.9 pg (ref 26.0–34.0)
MCHC: 30.3 g/dL (ref 30.0–36.0)
MCV: 92.1 fL (ref 78.0–100.0)
PLATELETS: 191 10*3/uL (ref 150–400)
RBC: 3.3 MIL/uL — ABNORMAL LOW (ref 3.87–5.11)
RDW: 14.7 % (ref 11.5–15.5)
WBC: 6.5 10*3/uL (ref 4.0–10.5)

## 2015-06-20 LAB — I-STAT CG4 LACTIC ACID, ED: Lactic Acid, Venous: 2.92 mmol/L (ref 0.5–2.0)

## 2015-06-20 NOTE — ED Notes (Signed)
Per pt, she had procedure 5th of Jan [facility] to get blood flow back to her L leg. Pt c/o bilateral leg swelling. Pt states her R leg cold and L leg cool. Pt concerned for poor circulation to bilateral legs. Pt denies pain. Pt has good caprefill on both feet, on her R foot her big toe is red and swollen and painful.

## 2015-06-20 NOTE — ED Notes (Signed)
Dr Delo @ bedside  

## 2015-06-20 NOTE — ED Provider Notes (Signed)
CSN: [id number]     Arrival date & time 06/20/15  1942 History  By signing my name below, I, April Casey, attest that this documentation has been prepared under the direction and in the presence of Geoffery Lyons, MD. Electronically Signed: Evon Casey, ED Scribe. 06/20/2015. 11:18 PM.      Chief Complaint  Patient presents with  . Leg Swelling    The history is provided by the patient. No language interpreter was used.   HPI Comments: April Casey is a 82 y.o. female who presents to the Emergency Department complaining of worsening foot swelling onset 1 year prior. She states that the swelling in her right foot has worsened in the last 6 month. She states that the right foot is normally swollen but is worse today.  She states that she has numbness in the right foot. She states that she intermittently has a pain in the great toe that radiates up her entire leg. She does report that she did recently stub the right great toe on something and presents with small abrasion on the right great toe. Pt reports that on January 5th she had a angioplasty of the iliac to improve the blood circulation in her lower legs. She states that she has a follow up appointment on January 20th, 2017     Past Medical History  Diagnosis Date  . Gout   . CKD (chronic kidney disease)   . Diabetes (HCC)   . TIA (transient ischemic attack)   . Dyslipidemia   . Anemia   . HTN (hypertension)   . PVD (peripheral vascular disease) (HCC)   . Orthostatic hypotension 06/12/2014  . Family history of adverse reaction to anesthesia     daughter has difficulty waking    Past Surgical History  Procedure Laterality Date  . Breast biopsy    . Tubal ligation    . Kidney transplant    . Av shunt for dialyisis     Family History  Problem Relation Age of Onset  . Hypertension Father   . Stroke Father   . Hypertension Mother   . Stroke Mother   . Diabetes Brother   . Heart attack Brother   . Heart failure Sister    . Diabetes Sister   . Heart attack Brother   . Cancer Brother    Social History  Substance Use Topics  . Smoking status: Never Smoker   . Smokeless tobacco: Never Used  . Alcohol Use: No   OB History    No data available     Review of Systems  Cardiovascular: Positive for leg swelling.  Neurological: Positive for numbness.  All other systems reviewed and are negative.     Allergies  Amoxicillin and Codeine  Home Medications   Prior to Admission medications   Medication Sig Start Date End Date Taking? Authorizing Provider  acetaminophen (TYLENOL) 325 MG tablet Take 650 mg by mouth every 6 (six) hours as needed (pain).    Historical Provider, MD  allopurinol (ZYLOPRIM) 100 MG tablet Take 100 mg by mouth daily.    Historical Provider, MD  aspirin 81 MG tablet Take 81 mg by mouth daily.    Historical Provider, MD  calcitRIOL (ROCALTROL) 0.25 MCG capsule Take 0.25 mcg by mouth daily.    Historical Provider, MD  cephALEXin (KEFLEX) 250 MG capsule Take 1 capsule (250 mg total) by mouth 2 (two) times daily. 01/18/15   Shanker Levora Dredge, MD  insulin glargine (LANTUS) 100 UNIT/ML injection  Inject 0.15 mLs (15 Units total) into the skin at bedtime. 11/02/14   Jeralyn Bennett, MD  labetalol (NORMODYNE) 200 MG tablet Take 200 mg by mouth 2 (two) times daily.  04/03/14   Historical Provider, MD  levothyroxine (SYNTHROID, LEVOTHROID) 25 MCG tablet Take 25 mcg by mouth daily before breakfast.    Historical Provider, MD  magnesium oxide (MAG-OX) 400 MG tablet Take 400 mg by mouth daily.    Historical Provider, MD  omeprazole (PRILOSEC) 20 MG capsule Take 20 mg by mouth daily.    Historical Provider, MD  predniSONE (DELTASONE) 5 MG tablet Take 5 mg by mouth daily with breakfast.    Historical Provider, MD  rosuvastatin (CRESTOR) 20 MG tablet Take 20 mg by mouth daily.    Historical Provider, MD  tacrolimus (PROGRAF) 1 MG capsule Take 1 mg by mouth 2 (two) times daily.    Historical Provider,  MD   BP 183/73 mmHg  Pulse 81  Temp(Src) 98.7 F (37.1 C) (Oral)  Resp 16  Ht 5\' 3"  (1.6 m)  Wt 153 lb 3.2 oz (69.491 kg)  BMI 27.14 kg/m2  SpO2 99%    Physical Exam  Constitutional: She is oriented to person, place, and time. She appears well-developed and well-nourished. No distress.  HENT:  Head: Normocephalic and atraumatic.  Eyes: Conjunctivae and EOM are normal.  Neck: Neck supple. No tracheal deviation present.  Cardiovascular: Normal rate.   Pulmonary/Chest: Effort normal. No respiratory distress.  Musculoskeletal: Normal range of motion. She exhibits tenderness.  Right foot and ankle have 2+ pitting edema. DP pulses are palpable bilaterally. Right great toe has mild erythema and abrasion adjacent to the nail. Right great toe is TTP.  Neurological: She is alert and oriented to person, place, and time.  Skin: Skin is warm and dry.  Psychiatric: She has a normal mood and affect. Her behavior is normal.  Nursing note and vitals reviewed.   ED Course  Procedures (including critical care time) DIAGNOSTIC STUDIES: Oxygen Saturation is 99% on RA, normal by my interpretation.    COORDINATION OF CARE: 11:17 PM-Discussed treatment plan with pt at bedside and pt agreed to plan.     Labs Review Labs Reviewed  COMPREHENSIVE METABOLIC PANEL - Abnormal; Notable for the following:    Glucose, Bld 442 (*)    BUN 37 (*)    Creatinine, Ser 3.00 (*)    Total Protein 5.9 (*)    Albumin 3.3 (*)    GFR calc non Af Amer 14 (*)    GFR calc Af Amer 16 (*)    All other components within normal limits  CBC - Abnormal; Notable for the following:    RBC 3.30 (*)    Hemoglobin 9.2 (*)    HCT 30.4 (*)    All other components within normal limits  I-STAT CG4 LACTIC ACID, ED - Abnormal; Notable for the following:    Lactic Acid, Venous 2.92 (*)    All other components within normal limits  I-STAT CG4 LACTIC ACID, ED    Imaging Review No results found.    EKG  Interpretation None      MDM   Final diagnoses:  None      Patient is an 82 year old female who is approximately one week status post angioplasty with stent to both iliac arteries. She presents today with concerns of pain in her right great toe and swelling to her right foot. Pulses are palpable in both feet and are also dopplerable. I  had some difficulty finding a DP pulse with the Doppler in the right foot, however the PT pulse was easy to obtain.  The appearance of her right great toe does look as though she may have an ingrown nail. This will be treated with Keflex and warm soaks.  I have discussed this case with Dr. Imogene Burnhen from vascular surgery who does not feel as though emergent intervention is necessary given the fact that she has blood flow to her feet. She will be set up for vascular studies of both legs tomorrow to ensure blood flow and also rule out the possibility of a DVT. I had considered giving Lovenox, however given the fact that she was recently operated on I am reluctant to do this until we know whether this is a DVT or not.   I personally performed the services described in this documentation, which was scribed in my presence. The recorded information has been reviewed and is accurate.       Geoffery Lyonsouglas Celina Shiley, MD 06/21/15 [phone]

## 2015-06-21 ENCOUNTER — Ambulatory Visit (HOSPITAL_BASED_OUTPATIENT_CLINIC_OR_DEPARTMENT_OTHER)
Admission: RE | Admit: 2015-06-21 | Discharge: 2015-06-21 | Disposition: A | Discharge status: Home / Self Care | Payer: Medicare Other | Source: Ambulatory Visit | Attending: Emergency Medicine | Admitting: Emergency Medicine

## 2015-06-21 ENCOUNTER — Other Ambulatory Visit (HOSPITAL_COMMUNITY): Payer: Self-pay | Admitting: Emergency Medicine

## 2015-06-21 ENCOUNTER — Ambulatory Visit (HOSPITAL_COMMUNITY)
Admission: RE | Admit: 2015-06-21 | Discharge: 2015-06-21 | Disposition: A | Discharge status: Home / Self Care | Payer: Medicare Other | Source: Ambulatory Visit

## 2015-06-21 DIAGNOSIS — Z9889 Other specified postprocedural states: Secondary | ICD-10-CM | POA: Diagnosis not present

## 2015-06-21 DIAGNOSIS — I739 Peripheral vascular disease, unspecified: Secondary | ICD-10-CM

## 2015-06-21 DIAGNOSIS — R2241 Localized swelling, mass and lump, right lower limb: Secondary | ICD-10-CM | POA: Diagnosis not present

## 2015-06-21 DIAGNOSIS — R52 Pain, unspecified: Secondary | ICD-10-CM

## 2015-06-21 DIAGNOSIS — M79609 Pain in unspecified limb: Secondary | ICD-10-CM

## 2015-06-21 MED ORDER — CEPHALEXIN 500 MG PO CAPS: 500.0000 mg | ORAL_CAPSULE | Freq: Four times a day (QID) | ORAL | Status: DC

## 2015-06-21 NOTE — Progress Notes (Signed)
VASCULAR LAB PRELIMINARY  PRELIMINARY  PRELIMINARY  PRELIMINARY  Right lower extremity venous duplex completed.    Preliminary report:  There is no DVT or SVT noted in the right lower extremity.   Cambell Stanek, RVT 06/21/2015, 10:25 AM

## 2015-06-21 NOTE — Discharge Instructions (Signed)
Keflex as prescribed.  Soak your toe as frequently as possible for the next several days.  Return tomorrow for a vascular study at the given time.  Follow up with her vascular surgeon next week.

## 2015-06-21 NOTE — Progress Notes (Signed)
VASCULAR LAB PRELIMINARY  ARTERIAL  ABI completed: Duplex imaging of the right lower extremity reveals significant calcific plaque throughout with areas of acoustic shadowing.  There is a >50% stenosis in the mid femoral artery with occlusion noted mid to distal femoral with collateral flow and reconstitution to the popliteal.  Waveforms are monophasic in the common femoral and proximal femoral arteries with severely dampened flow noted in the popliteal and posterior tibial arteries.  The anterior tibial artery appears completely calcified.      RIGHT    LEFT    PRESSURE WAVEFORM  PRESSURE WAVEFORM  BRACHIAL 196 T BRACHIAL restricted   DP 83 DM DP >300 DM  AT   AT    PT >300 DM PT >300 DM  PER   PER    GREAT TOE Pt could not tolerate touching toe NA GREAT TOE  NA    RIGHT LEFT  ABI 0.42/calcified calcified     Deiondra Denley, RVT 06/21/2015, 11:03 AM

## 2015-07-20 ENCOUNTER — Other Ambulatory Visit (HOSPITAL_COMMUNITY): Payer: Self-pay | Admitting: *Deleted

## 2015-07-21 ENCOUNTER — Encounter (HOSPITAL_COMMUNITY)
Admission: RE | Admit: 2015-07-21 | Discharge: 2015-07-21 | Disposition: A | Discharge status: Home / Self Care | Payer: Medicare Other | Source: Ambulatory Visit | Attending: Nephrology | Admitting: Nephrology

## 2015-07-21 DIAGNOSIS — Z79899 Other long term (current) drug therapy: Secondary | ICD-10-CM | POA: Diagnosis not present

## 2015-07-21 DIAGNOSIS — D631 Anemia in chronic kidney disease: Secondary | ICD-10-CM | POA: Insufficient documentation

## 2015-07-21 DIAGNOSIS — N184 Chronic kidney disease, stage 4 (severe): Secondary | ICD-10-CM | POA: Diagnosis not present

## 2015-07-21 DIAGNOSIS — Z5181 Encounter for therapeutic drug level monitoring: Secondary | ICD-10-CM | POA: Diagnosis not present

## 2015-07-21 LAB — POCT HEMOGLOBIN-HEMACUE: Hemoglobin: 8.5 g/dL — ABNORMAL LOW (ref 12.0–15.0)

## 2015-07-21 LAB — IRON AND TIBC
IRON: 66 ug/dL (ref 28–170)
Saturation Ratios: 27 % (ref 10.4–31.8)
TIBC: 241 ug/dL — ABNORMAL LOW (ref 250–450)
UIBC: 175 ug/dL

## 2015-07-21 LAB — FERRITIN: FERRITIN: 798 ng/mL — AB (ref 11–307)

## 2015-07-21 MED ORDER — DARBEPOETIN ALFA 200 MCG/0.4ML IJ SOSY
200.0000 ug | PREFILLED_SYRINGE | INTRAMUSCULAR | Status: DC
  Administered 2015-07-21: 200 ug via SUBCUTANEOUS

## 2015-07-21 MED ORDER — DARBEPOETIN ALFA 200 MCG/0.4ML IJ SOSY
PREFILLED_SYRINGE | INTRAMUSCULAR | Status: AC
Start: 2015-07-21 — End: 2015-07-21
  Administered 2015-07-21: 200 ug via SUBCUTANEOUS
  Filled 2015-07-21: qty 0.4

## 2015-08-14 ENCOUNTER — Encounter (HOSPITAL_COMMUNITY)
Admission: RE | Admit: 2015-08-14 | Discharge: 2015-08-14 | Disposition: A | Discharge status: Home / Self Care | Payer: Medicare Other | Source: Ambulatory Visit | Attending: Nephrology | Admitting: Nephrology

## 2015-08-14 DIAGNOSIS — Z5181 Encounter for therapeutic drug level monitoring: Secondary | ICD-10-CM | POA: Diagnosis not present

## 2015-08-14 DIAGNOSIS — N184 Chronic kidney disease, stage 4 (severe): Secondary | ICD-10-CM | POA: Insufficient documentation

## 2015-08-14 DIAGNOSIS — Z79899 Other long term (current) drug therapy: Secondary | ICD-10-CM | POA: Insufficient documentation

## 2015-08-14 DIAGNOSIS — D631 Anemia in chronic kidney disease: Secondary | ICD-10-CM | POA: Insufficient documentation

## 2015-08-14 LAB — POCT HEMOGLOBIN-HEMACUE: Hemoglobin: 9.2 g/dL — ABNORMAL LOW (ref 12.0–15.0)

## 2015-08-14 MED ORDER — DARBEPOETIN ALFA 200 MCG/0.4ML IJ SOSY
200.0000 ug | PREFILLED_SYRINGE | INTRAMUSCULAR | Status: DC
  Administered 2015-08-14: 200 ug via SUBCUTANEOUS

## 2015-08-14 MED ORDER — DARBEPOETIN ALFA 200 MCG/0.4ML IJ SOSY
PREFILLED_SYRINGE | INTRAMUSCULAR | Status: AC
  Filled 2015-08-14: qty 0.4

## 2015-09-11 ENCOUNTER — Encounter (HOSPITAL_COMMUNITY)
Admission: RE | Admit: 2015-09-11 | Discharge: 2015-09-11 | Disposition: A | Discharge status: Home / Self Care | Payer: Medicare Other | Source: Ambulatory Visit | Attending: Nephrology | Admitting: Nephrology

## 2015-09-11 DIAGNOSIS — N184 Chronic kidney disease, stage 4 (severe): Secondary | ICD-10-CM | POA: Diagnosis present

## 2015-09-11 DIAGNOSIS — D631 Anemia in chronic kidney disease: Secondary | ICD-10-CM | POA: Diagnosis not present

## 2015-09-11 DIAGNOSIS — Z5181 Encounter for therapeutic drug level monitoring: Secondary | ICD-10-CM | POA: Insufficient documentation

## 2015-09-11 DIAGNOSIS — Z79899 Other long term (current) drug therapy: Secondary | ICD-10-CM | POA: Diagnosis not present

## 2015-09-11 LAB — FERRITIN: Ferritin: 820 ng/mL — ABNORMAL HIGH (ref 11–307)

## 2015-09-11 LAB — IRON AND TIBC
IRON: 84 ug/dL (ref 28–170)
Saturation Ratios: 31 % (ref 10.4–31.8)
TIBC: 274 ug/dL (ref 250–450)
UIBC: 190 ug/dL

## 2015-09-11 MED ORDER — DARBEPOETIN ALFA 200 MCG/0.4ML IJ SOSY
PREFILLED_SYRINGE | INTRAMUSCULAR | Status: AC
  Filled 2015-09-11: qty 0.4

## 2015-09-11 MED ORDER — DARBEPOETIN ALFA 200 MCG/0.4ML IJ SOSY
200.0000 ug | PREFILLED_SYRINGE | INTRAMUSCULAR | Status: DC
  Administered 2015-09-11: 200 ug via SUBCUTANEOUS

## 2015-09-14 LAB — POCT HEMOGLOBIN-HEMACUE: Hemoglobin: 9.3 g/dL — ABNORMAL LOW (ref 12.0–15.0)

## 2015-10-08 ENCOUNTER — Other Ambulatory Visit (HOSPITAL_COMMUNITY): Payer: Self-pay | Admitting: *Deleted

## 2015-10-09 ENCOUNTER — Encounter (HOSPITAL_COMMUNITY)
Admission: RE | Admit: 2015-10-09 | Discharge: 2015-10-09 | Disposition: A | Discharge status: Home / Self Care | Payer: Medicare Other | Source: Ambulatory Visit | Attending: Nephrology | Admitting: Nephrology

## 2015-10-09 DIAGNOSIS — Z5181 Encounter for therapeutic drug level monitoring: Secondary | ICD-10-CM | POA: Diagnosis not present

## 2015-10-09 DIAGNOSIS — Z79899 Other long term (current) drug therapy: Secondary | ICD-10-CM | POA: Diagnosis not present

## 2015-10-09 DIAGNOSIS — N184 Chronic kidney disease, stage 4 (severe): Secondary | ICD-10-CM | POA: Diagnosis present

## 2015-10-09 DIAGNOSIS — D631 Anemia in chronic kidney disease: Secondary | ICD-10-CM | POA: Diagnosis not present

## 2015-10-09 LAB — IRON AND TIBC
IRON: 79 ug/dL (ref 28–170)
SATURATION RATIOS: 32 % — AB (ref 10.4–31.8)
TIBC: 244 ug/dL — AB (ref 250–450)
UIBC: 165 ug/dL

## 2015-10-09 LAB — POCT HEMOGLOBIN-HEMACUE: Hemoglobin: 10 g/dL — ABNORMAL LOW (ref 12.0–15.0)

## 2015-10-09 LAB — FERRITIN: FERRITIN: 749 ng/mL — AB (ref 11–307)

## 2015-10-09 MED ORDER — DARBEPOETIN ALFA 150 MCG/0.3ML IJ SOSY
150.0000 ug | PREFILLED_SYRINGE | INTRAMUSCULAR | Status: DC
  Administered 2015-10-09: 150 ug via SUBCUTANEOUS

## 2015-10-09 MED ORDER — DARBEPOETIN ALFA 150 MCG/0.3ML IJ SOSY
PREFILLED_SYRINGE | INTRAMUSCULAR | Status: AC
  Filled 2015-10-09: qty 0.3

## 2015-10-23 ENCOUNTER — Encounter (HOSPITAL_COMMUNITY)
Admission: RE | Admit: 2015-10-23 | Discharge: 2015-10-23 | Disposition: A | Discharge status: Home / Self Care | Payer: Medicare Other | Source: Ambulatory Visit | Attending: Nephrology | Admitting: Nephrology

## 2015-10-23 DIAGNOSIS — N184 Chronic kidney disease, stage 4 (severe): Secondary | ICD-10-CM | POA: Diagnosis not present

## 2015-10-23 MED ORDER — DARBEPOETIN ALFA 150 MCG/0.3ML IJ SOSY
PREFILLED_SYRINGE | INTRAMUSCULAR | Status: AC
Start: 2015-10-23 — End: 2015-10-23
  Administered 2015-10-23: 150 ug via SUBCUTANEOUS
  Filled 2015-10-23: qty 0.3

## 2015-10-23 MED ORDER — DARBEPOETIN ALFA 150 MCG/0.3ML IJ SOSY: 150.0000 ug | PREFILLED_SYRINGE | INTRAMUSCULAR | Status: DC

## 2015-10-26 LAB — POCT HEMOGLOBIN-HEMACUE: Hemoglobin: 10.3 g/dL — ABNORMAL LOW (ref 12.0–15.0)

## 2015-11-06 ENCOUNTER — Encounter (HOSPITAL_COMMUNITY)
Admission: RE | Admit: 2015-11-06 | Discharge: 2015-11-06 | Disposition: A | Discharge status: Home / Self Care | Payer: Medicare Other | Source: Ambulatory Visit | Attending: Nephrology | Admitting: Nephrology

## 2015-11-06 DIAGNOSIS — Z5181 Encounter for therapeutic drug level monitoring: Secondary | ICD-10-CM | POA: Insufficient documentation

## 2015-11-06 DIAGNOSIS — Z79899 Other long term (current) drug therapy: Secondary | ICD-10-CM | POA: Insufficient documentation

## 2015-11-06 DIAGNOSIS — N184 Chronic kidney disease, stage 4 (severe): Secondary | ICD-10-CM | POA: Insufficient documentation

## 2015-11-06 DIAGNOSIS — D631 Anemia in chronic kidney disease: Secondary | ICD-10-CM | POA: Diagnosis not present

## 2015-11-06 LAB — FERRITIN: Ferritin: 854 ng/mL — ABNORMAL HIGH (ref 11–307)

## 2015-11-06 LAB — IRON AND TIBC
IRON: 57 ug/dL (ref 28–170)
Saturation Ratios: 28 % (ref 10.4–31.8)
TIBC: 204 ug/dL — AB (ref 250–450)
UIBC: 147 ug/dL

## 2015-11-06 LAB — POCT HEMOGLOBIN-HEMACUE: HEMOGLOBIN: 9.5 g/dL — AB (ref 12.0–15.0)

## 2015-11-06 MED ORDER — DARBEPOETIN ALFA 150 MCG/0.3ML IJ SOSY
150.0000 ug | PREFILLED_SYRINGE | INTRAMUSCULAR | Status: DC
  Administered 2015-11-06: 150 ug via SUBCUTANEOUS

## 2015-11-06 MED ORDER — DARBEPOETIN ALFA 150 MCG/0.3ML IJ SOSY
PREFILLED_SYRINGE | INTRAMUSCULAR | Status: AC
  Filled 2015-11-06: qty 0.3

## 2015-11-11 ENCOUNTER — Encounter (HOSPITAL_COMMUNITY): Payer: Self-pay | Admitting: Adult Health

## 2015-11-11 ENCOUNTER — Emergency Department (HOSPITAL_COMMUNITY)
Admission: EM | Admit: 2015-11-11 | Discharge: 2015-11-12 | Disposition: A | Discharge status: Home / Self Care | Payer: Medicare Other | Attending: Emergency Medicine | Admitting: Emergency Medicine

## 2015-11-11 DIAGNOSIS — E1122 Type 2 diabetes mellitus with diabetic chronic kidney disease: Secondary | ICD-10-CM | POA: Insufficient documentation

## 2015-11-11 DIAGNOSIS — I959 Hypotension, unspecified: Secondary | ICD-10-CM | POA: Insufficient documentation

## 2015-11-11 DIAGNOSIS — I129 Hypertensive chronic kidney disease with stage 1 through stage 4 chronic kidney disease, or unspecified chronic kidney disease: Secondary | ICD-10-CM | POA: Insufficient documentation

## 2015-11-11 DIAGNOSIS — Z7952 Long term (current) use of systemic steroids: Secondary | ICD-10-CM | POA: Diagnosis not present

## 2015-11-11 DIAGNOSIS — Z79899 Other long term (current) drug therapy: Secondary | ICD-10-CM | POA: Insufficient documentation

## 2015-11-11 DIAGNOSIS — E785 Hyperlipidemia, unspecified: Secondary | ICD-10-CM | POA: Insufficient documentation

## 2015-11-11 DIAGNOSIS — M109 Gout, unspecified: Secondary | ICD-10-CM | POA: Diagnosis not present

## 2015-11-11 DIAGNOSIS — Z940 Kidney transplant status: Secondary | ICD-10-CM | POA: Diagnosis not present

## 2015-11-11 DIAGNOSIS — R197 Diarrhea, unspecified: Secondary | ICD-10-CM | POA: Insufficient documentation

## 2015-11-11 DIAGNOSIS — Z792 Long term (current) use of antibiotics: Secondary | ICD-10-CM | POA: Insufficient documentation

## 2015-11-11 DIAGNOSIS — R42 Dizziness and giddiness: Secondary | ICD-10-CM | POA: Diagnosis present

## 2015-11-11 DIAGNOSIS — Z794 Long term (current) use of insulin: Secondary | ICD-10-CM | POA: Diagnosis not present

## 2015-11-11 DIAGNOSIS — Z880 Allergy status to penicillin: Secondary | ICD-10-CM | POA: Insufficient documentation

## 2015-11-11 DIAGNOSIS — N189 Chronic kidney disease, unspecified: Secondary | ICD-10-CM | POA: Insufficient documentation

## 2015-11-11 DIAGNOSIS — Z8673 Personal history of transient ischemic attack (TIA), and cerebral infarction without residual deficits: Secondary | ICD-10-CM | POA: Insufficient documentation

## 2015-11-11 DIAGNOSIS — Z862 Personal history of diseases of the blood and blood-forming organs and certain disorders involving the immune mechanism: Secondary | ICD-10-CM | POA: Insufficient documentation

## 2015-11-11 DIAGNOSIS — R011 Cardiac murmur, unspecified: Secondary | ICD-10-CM | POA: Diagnosis not present

## 2015-11-11 DIAGNOSIS — Z7982 Long term (current) use of aspirin: Secondary | ICD-10-CM | POA: Insufficient documentation

## 2015-11-11 LAB — COMPREHENSIVE METABOLIC PANEL
ALBUMIN: 3.3 g/dL — AB (ref 3.5–5.0)
ALT: 11 U/L — ABNORMAL LOW (ref 14–54)
ANION GAP: 11 (ref 5–15)
AST: 22 U/L (ref 15–41)
Alkaline Phosphatase: 57 U/L (ref 38–126)
BUN: 50 mg/dL — ABNORMAL HIGH (ref 6–20)
CALCIUM: 10 mg/dL (ref 8.9–10.3)
CHLORIDE: 105 mmol/L (ref 101–111)
CO2: 22 mmol/L (ref 22–32)
Creatinine, Ser: 7.34 mg/dL — ABNORMAL HIGH (ref 0.44–1.00)
GFR calc non Af Amer: 5 mL/min — ABNORMAL LOW (ref >60)
GFR, EST AFRICAN AMERICAN: 5 mL/min — AB (ref >60)
GLUCOSE: 116 mg/dL — AB (ref 65–99)
POTASSIUM: 3.9 mmol/L (ref 3.5–5.1)
SODIUM: 138 mmol/L (ref 135–145)
Total Bilirubin: 0.7 mg/dL (ref 0.3–1.2)
Total Protein: 6.2 g/dL — ABNORMAL LOW (ref 6.5–8.1)

## 2015-11-11 LAB — URINALYSIS, ROUTINE W REFLEX MICROSCOPIC
Glucose, UA: NEGATIVE mg/dL
HGB URINE DIPSTICK: NEGATIVE
Ketones, ur: 15 mg/dL — AB
NITRITE: NEGATIVE
PH: 5 (ref 5.0–8.0)
Protein, ur: 30 mg/dL — AB
SPECIFIC GRAVITY, URINE: 1.019 (ref 1.005–1.030)

## 2015-11-11 LAB — URINE MICROSCOPIC-ADD ON: RBC / HPF: NONE SEEN RBC/hpf (ref 0–5)

## 2015-11-11 LAB — CBC
HEMATOCRIT: 31.6 % — AB (ref 36.0–46.0)
HEMOGLOBIN: 9.2 g/dL — AB (ref 12.0–15.0)
MCH: 25.6 pg — AB (ref 26.0–34.0)
MCHC: 29.1 g/dL — AB (ref 30.0–36.0)
MCV: 88 fL (ref 78.0–100.0)
Platelets: 237 10*3/uL (ref 150–400)
RBC: 3.59 MIL/uL — AB (ref 3.87–5.11)
RDW: 15 % (ref 11.5–15.5)
WBC: 4.4 10*3/uL (ref 4.0–10.5)

## 2015-11-11 LAB — I-STAT TROPONIN, ED: TROPONIN I, POC: 0.05 ng/mL (ref 0.00–0.08)

## 2015-11-11 MED ORDER — SODIUM CHLORIDE 0.9 % IV BOLUS (SEPSIS)
500.0000 mL | Freq: Once | INTRAVENOUS | Status: AC
  Administered 2015-11-12: 500 mL via INTRAVENOUS

## 2015-11-11 NOTE — ED Provider Notes (Signed)
CSN: [id number]     Arrival date & time 11/11/15  2033 History   First MD Initiated Contact with Patient 11/11/15 2257     Chief Complaint  Patient presents with  . Hypotension     (Consider location/radiation/quality/duration/timing/severity/associated sxs/prior Treatment) HPI Comments: Patient presents with nausea and dizziness. She has a history of diabetes, hypertension and chronic kidney disease. She had a kidney transplant about 4 years ago. She went for a routine checkup in April and was found that her kidney is nonfunctioning. She's followed locally by Dr. Eliott Nine. She was at Dr. Elza Rafter office today and was told that she needs to make a decision about going back on dialysis. She was told that her kidneys are not functioning and that she needs to go back on dialysis but the patient has not yet decided if she wants to do this. She states over the 2 weeks she's had fatigue and excessive sleepiness. She's had some nausea for the last 2 days but no vomiting. She's had about a 3 day history of loose stools. She denies any fevers. She has been feeling lightheaded and dizzy. At home, the daughter noted her blood pressure to be down into the 80s systolic and for this reason she was brought to the emergency department today. Patient is still unsure whether she wants to do dialysis. She denies any chest pain or shortness of breath. She still produces urine and denies any dysuria or urinary frequency.   Past Medical History  Diagnosis Date  . Gout   . CKD (chronic kidney disease)   . Diabetes (HCC)   . TIA (transient ischemic attack)   . Dyslipidemia   . Anemia   . HTN (hypertension)   . PVD (peripheral vascular disease) (HCC)   . Orthostatic hypotension 06/12/2014  . Family history of adverse reaction to anesthesia     daughter has difficulty waking    Past Surgical History  Procedure Laterality Date  . Breast biopsy    . Tubal ligation    . Kidney transplant    . Av shunt for dialyisis      Family History  Problem Relation Age of Onset  . Hypertension Father   . Stroke Father   . Hypertension Mother   . Stroke Mother   . Diabetes Brother   . Heart attack Brother   . Heart failure Sister   . Diabetes Sister   . Heart attack Brother   . Cancer Brother    Social History  Substance Use Topics  . Smoking status: Never Smoker   . Smokeless tobacco: Never Used  . Alcohol Use: No   OB History    No data available     Review of Systems  Constitutional: Positive for fatigue. Negative for fever, chills and diaphoresis.  HENT: Negative for congestion, rhinorrhea and sneezing.   Eyes: Negative.   Respiratory: Negative for cough, chest tightness and shortness of breath.   Cardiovascular: Negative for chest pain and leg swelling.  Gastrointestinal: Positive for nausea and diarrhea. Negative for vomiting, abdominal pain and blood in stool.  Genitourinary: Negative for frequency, hematuria, flank pain and difficulty urinating.  Musculoskeletal: Negative for back pain and arthralgias.  Skin: Negative for rash.  Neurological: Positive for dizziness and light-headedness. Negative for speech difficulty, weakness, numbness and headaches.      Allergies  Amoxicillin and Codeine  Home Medications   Prior to Admission medications   Medication Sig Start Date End Date Taking? Authorizing Provider  acetaminophen (TYLENOL)  325 MG tablet Take 650 mg by mouth every 6 (six) hours as needed (pain).    Historical Provider, MD  allopurinol (ZYLOPRIM) 100 MG tablet Take 100 mg by mouth daily.    Historical Provider, MD  aspirin 81 MG tablet Take 81 mg by mouth daily.    Historical Provider, MD  calcitRIOL (ROCALTROL) 0.25 MCG capsule Take 0.25 mcg by mouth daily.    Historical Provider, MD  cephALEXin (KEFLEX) 500 MG capsule Take 1 capsule (500 mg total) by mouth 4 (four) times daily. 06/21/15   Geoffery Lyonsouglas Delo, MD  insulin glargine (LANTUS) 100 UNIT/ML injection Inject 0.15 mLs (15 Units  total) into the skin at bedtime. 11/02/14   Jeralyn BennettEzequiel Zamora, MD  labetalol (NORMODYNE) 200 MG tablet Take 200 mg by mouth 2 (two) times daily.  04/03/14   Historical Provider, MD  levothyroxine (SYNTHROID, LEVOTHROID) 25 MCG tablet Take 25 mcg by mouth daily before breakfast.    Historical Provider, MD  magnesium oxide (MAG-OX) 400 MG tablet Take 400 mg by mouth daily.    Historical Provider, MD  omeprazole (PRILOSEC) 20 MG capsule Take 20 mg by mouth daily.    Historical Provider, MD  predniSONE (DELTASONE) 5 MG tablet Take 5 mg by mouth daily with breakfast.    Historical Provider, MD  rosuvastatin (CRESTOR) 20 MG tablet Take 20 mg by mouth daily.    Historical Provider, MD  tacrolimus (PROGRAF) 1 MG capsule Take 1 mg by mouth 2 (two) times daily.    Historical Provider, MD   BP 109/48 mmHg  Pulse 80  Temp(Src) 98.1 F (36.7 C) (Oral)  Resp 18  Ht 5\' 3"  (1.6 m)  SpO2 100% Physical Exam  Constitutional: She is oriented to person, place, and time. She appears well-developed and well-nourished.  HENT:  Head: Normocephalic and atraumatic.  Eyes: Pupils are equal, round, and reactive to light.  Neck: Normal range of motion. Neck supple.  Cardiovascular: Normal rate and regular rhythm.   Murmur heard. Pulmonary/Chest: Effort normal and breath sounds normal. No respiratory distress. She has no wheezes. She has no rales. She exhibits no tenderness.  Abdominal: Soft. Bowel sounds are normal. There is no tenderness. There is no rebound and no guarding.  Musculoskeletal: Normal range of motion. She exhibits no edema.  Lymphadenopathy:    She has no cervical adenopathy.  Neurological: She is alert and oriented to person, place, and time.  Skin: Skin is warm and dry. No rash noted.  Psychiatric: She has a normal mood and affect.    ED Course  Procedures (including critical care time) Labs Review Results for orders placed or performed during the hospital encounter of 11/11/15  Comprehensive  metabolic panel  Result Value Ref Range   Sodium 138 135 - 145 mmol/L   Potassium 3.9 3.5 - 5.1 mmol/L   Chloride 105 101 - 111 mmol/L   CO2 22 22 - 32 mmol/L   Glucose, Bld 116 (H) 65 - 99 mg/dL   BUN 50 (H) 6 - 20 mg/dL   Creatinine, Ser 8.297.34 (H) 0.44 - 1.00 mg/dL   Calcium 56.210.0 8.9 - 13.010.3 mg/dL   Total Protein 6.2 (L) 6.5 - 8.1 g/dL   Albumin 3.3 (L) 3.5 - 5.0 g/dL   AST 22 15 - 41 U/L   ALT 11 (L) 14 - 54 U/L   Alkaline Phosphatase 57 38 - 126 U/L   Total Bilirubin 0.7 0.3 - 1.2 mg/dL   GFR calc non Af Amer 5 (L) >  60 mL/min   GFR calc Af Amer 5 (L) >60 mL/min   Anion gap 11 5 - 15  CBC  Result Value Ref Range   WBC 4.4 4.0 - 10.5 K/uL   RBC 3.59 (L) 3.87 - 5.11 MIL/uL   Hemoglobin 9.2 (L) 12.0 - 15.0 g/dL   HCT 78.2 (L) 95.6 - 21.3 %   MCV 88.0 78.0 - 100.0 fL   MCH 25.6 (L) 26.0 - 34.0 pg   MCHC 29.1 (L) 30.0 - 36.0 g/dL   RDW 08.6 57.8 - 46.9 %   Platelets 237 150 - 400 K/uL  Urinalysis, Routine w reflex microscopic  Result Value Ref Range   Color, Urine YELLOW YELLOW   APPearance CLOUDY (A) CLEAR   Specific Gravity, Urine 1.019 1.005 - 1.030   pH 5.0 5.0 - 8.0   Glucose, UA NEGATIVE NEGATIVE mg/dL   Hgb urine dipstick NEGATIVE NEGATIVE   Bilirubin Urine SMALL (A) NEGATIVE   Ketones, ur 15 (A) NEGATIVE mg/dL   Protein, ur 30 (A) NEGATIVE mg/dL   Nitrite NEGATIVE NEGATIVE   Leukocytes, UA MODERATE (A) NEGATIVE  Urine microscopic-add on  Result Value Ref Range   Squamous Epithelial / LPF 0-5 (A) NONE SEEN   WBC, UA 0-5 0 - 5 WBC/hpf   RBC / HPF NONE SEEN 0 - 5 RBC/hpf   Bacteria, UA RARE (A) NONE SEEN   Casts HYALINE CASTS (A) NEGATIVE   Urine-Other YEAST PRESENT   I-Stat Troponin, ED (not at [facility])  Result Value Ref Range   Troponin i, poc 0.05 0.00 - 0.08 ng/mL   Comment 3           No results found.    Imaging Review No results found. I have personally reviewed and evaluated these images and lab results as part of my medical decision-making.   EKG  Interpretation   Date/Time:  Wednesday November 11 2015 20:55:48 EDT Ventricular Rate:  86 PR Interval:  170 QRS Duration: 76 QT Interval:  438 QTC Calculation: 524 R Axis:   -13 Text Interpretation:  Normal sinus rhythm Moderate voltage criteria for  LVH, may be normal variant Cannot rule out Septal infarct , age  undetermined Possible Lateral infarct , age undetermined Prolonged QT  Abnormal ECG similar to prior EKG other than QT more prolonged Confirmed  by Brooklinn Longbottom  MD, Felicita Nuncio ([id number]) on 11/11/2015 11:19:17 PM      MDM   Final diagnoses:  CKD (chronic kidney disease), unspecified stage  Hypotension, unspecified hypotension type    Patient presents with nausea dizziness and loose stools. I feel like her symptoms are related to her renal failure. She still not sure if she wants to start dialysis. She also did take her labetalol this morning but is not currently taking any further doses. She was given a small bolus of IV fluids here in emergency department. She wants to go home. I spoke with Dr. Allena Katz with nephrology who did not feel that hospitalization would offer her much benefit at this point. He will have the office call the patient in the morning to see how she is doing and if she's decided about dialysis. Return precautions were given.    Rolan Bucco, MD 11/12/15 [phone]

## 2015-11-11 NOTE — ED Notes (Signed)
Attempted IV start unsuccessfully. Pt and family stated she is a hard stick and that she has been stuck multiple times recently for blood draws and IV attempts. IV team consult placed.

## 2015-11-11 NOTE — ED Notes (Signed)
Presents with hypotension and dizziness worse with change of position. Had a doctor appointment today and while at doctor BP was 100/50, took off her BP medications and also told her her kidneys are failing. Went home and while at home beacme nauseated and dizzy. Family took BP and it was 80/40, endorses 2 days of diarrhea and nausea today and once yesterday. Family reports increased sleepiness and lack of appetite. Endorses lower back back pain. Denies dysuria. Denies dizziness and nausea at this time.

## 2015-11-12 DIAGNOSIS — I959 Hypotension, unspecified: Secondary | ICD-10-CM | POA: Diagnosis not present

## 2015-11-12 NOTE — Discharge Instructions (Signed)
End-Stage Kidney Disease The kidneys are two organs that lie on either side of the spine between the middle of the back and the front of the abdomen. The kidneys:   Remove wastes and extra water from the blood.   Produce important hormones. These help keep bones strong, regulate blood pressure, and help create red blood cells.   Balance the fluids and chemicals in the blood and tissues. End-stage kidney disease occurs when the kidneys are so damaged that they cannot do their job. When the kidneys cannot do their job, life-threatening problems occur. The body cannot stay clean and strong without the help of the kidneys. In end-stage kidney disease, the kidneys cannot get better.You need a new kidney or treatments to do some of the work healthy kidneys do in order to stay alive. CAUSES  End-stage kidney disease usually occurs when a long-lasting (chronic) kidney disease gets worse. It may also occur after the kidneys are suddenly damaged (acute kidney injury).  SYMPTOMS   Swelling (edema) of the legs, ankles, or feet.   Tiredness (lethargy).   Nausea or vomiting.   Confusion.   Problems with urination, such as:   Decreased urine production.   Frequent urination, especially at night.   Frequent accidents in children who are potty trained.   Muscle twitches and cramps.   Persistent itchiness.   Loss of appetite.   Headaches.   Abnormally dark or light skin.   Numbness in the hands or feet.   Easy bruising.   Frequent hiccups.   Menstruation stops. DIAGNOSIS  Your health care provider will measure your blood pressure and take some tests. These may include:   Urine tests.   Blood tests.   Imaging tests, such as:   An ultrasound exam.   Computed tomography (CT).  A kidney biopsy. TREATMENT  There are two treatments for end-stage kidney disease:   A procedure that removes toxic wastes from the body (dialysis).   Receiving a new kidney  (kidney transplant). Both of these treatments have serious risks and consequences. Your health care provider will help you determine which treatment is best for you based on your health, age, and other factors. In addition to having dialysis or a kidney transplant, you may need to take medicines to control high blood pressure (hypertension) and cholesterol and to decrease phosphorus levels in your blood.  HOME CARE INSTRUCTIONS  Follow your prescribed diet.   Take medicines only as directed by your health care provider.   Do not take any new medicines (prescription, over-the-counter, or nutritional supplements) unless approved by your health care provider. Many medicines can worsen your kidney damage or need to have the dose adjusted.   Keep all follow-up visits as directed by your health care provider. MAKE SURE YOU:  Understand these instructions.  Will watch your condition.  Will get help right away if you are not doing well or get worse.   This information is not intended to replace advice given to you by your health care provider. Make sure you discuss any questions you have with your health care provider.   Document Released: 08/13/2003 Document Revised: 06/13/2014 Document Reviewed: 01/20/2012 Elsevier Interactive Patient Education 2016 [employer]  

## 2015-11-12 NOTE — ED Notes (Signed)
CALLED [facility]--Sahmir Weatherbee

## 2015-11-13 LAB — URINE CULTURE: Special Requests: NORMAL

## 2015-11-16 ENCOUNTER — Encounter (HOSPITAL_COMMUNITY): Payer: Self-pay | Admitting: *Deleted

## 2015-11-16 DIAGNOSIS — Z794 Long term (current) use of insulin: Secondary | ICD-10-CM | POA: Diagnosis not present

## 2015-11-16 DIAGNOSIS — E1122 Type 2 diabetes mellitus with diabetic chronic kidney disease: Secondary | ICD-10-CM | POA: Diagnosis not present

## 2015-11-16 DIAGNOSIS — I129 Hypertensive chronic kidney disease with stage 1 through stage 4 chronic kidney disease, or unspecified chronic kidney disease: Secondary | ICD-10-CM | POA: Insufficient documentation

## 2015-11-16 DIAGNOSIS — Z79899 Other long term (current) drug therapy: Secondary | ICD-10-CM | POA: Insufficient documentation

## 2015-11-16 DIAGNOSIS — Z7982 Long term (current) use of aspirin: Secondary | ICD-10-CM | POA: Diagnosis not present

## 2015-11-16 DIAGNOSIS — Z8673 Personal history of transient ischemic attack (TIA), and cerebral infarction without residual deficits: Secondary | ICD-10-CM | POA: Insufficient documentation

## 2015-11-16 DIAGNOSIS — N189 Chronic kidney disease, unspecified: Secondary | ICD-10-CM | POA: Diagnosis not present

## 2015-11-16 DIAGNOSIS — E860 Dehydration: Secondary | ICD-10-CM | POA: Diagnosis not present

## 2015-11-16 DIAGNOSIS — M549 Dorsalgia, unspecified: Secondary | ICD-10-CM | POA: Diagnosis present

## 2015-11-16 LAB — COMPREHENSIVE METABOLIC PANEL
ALBUMIN: 3.3 g/dL — AB (ref 3.5–5.0)
ALK PHOS: 58 U/L (ref 38–126)
ALT: 9 U/L — ABNORMAL LOW (ref 14–54)
ANION GAP: 12 (ref 5–15)
AST: 21 U/L (ref 15–41)
BUN: 37 mg/dL — ABNORMAL HIGH (ref 6–20)
CHLORIDE: 105 mmol/L (ref 101–111)
CO2: 26 mmol/L (ref 22–32)
Calcium: 10.3 mg/dL (ref 8.9–10.3)
Creatinine, Ser: 5.79 mg/dL — ABNORMAL HIGH (ref 0.44–1.00)
GFR calc Af Amer: 7 mL/min — ABNORMAL LOW (ref >60)
GFR calc non Af Amer: 6 mL/min — ABNORMAL LOW (ref >60)
GLUCOSE: 64 mg/dL — AB (ref 65–99)
POTASSIUM: 4.3 mmol/L (ref 3.5–5.1)
SODIUM: 143 mmol/L (ref 135–145)
Total Bilirubin: 0.5 mg/dL (ref 0.3–1.2)
Total Protein: 6.3 g/dL — ABNORMAL LOW (ref 6.5–8.1)

## 2015-11-16 LAB — CBC WITH DIFFERENTIAL/PLATELET
BASOS PCT: 1 %
Basophils Absolute: 0 10*3/uL (ref 0.0–0.1)
EOS ABS: 0.5 10*3/uL (ref 0.0–0.7)
Eosinophils Relative: 10 %
HCT: 34.8 % — ABNORMAL LOW (ref 36.0–46.0)
HEMOGLOBIN: 10.2 g/dL — AB (ref 12.0–15.0)
Lymphocytes Relative: 32 %
Lymphs Abs: 1.6 10*3/uL (ref 0.7–4.0)
MCH: 26.1 pg (ref 26.0–34.0)
MCHC: 29.3 g/dL — AB (ref 30.0–36.0)
MCV: 89 fL (ref 78.0–100.0)
MONOS PCT: 11 %
Monocytes Absolute: 0.5 10*3/uL (ref 0.1–1.0)
NEUTROS PCT: 46 %
Neutro Abs: 2.2 10*3/uL (ref 1.7–7.7)
Platelets: 249 10*3/uL (ref 150–400)
RBC: 3.91 MIL/uL (ref 3.87–5.11)
RDW: 15.1 % (ref 11.5–15.5)
WBC: 4.8 10*3/uL (ref 4.0–10.5)

## 2015-11-16 NOTE — ED Notes (Signed)
Patient presents from home via EMS with c/o generalized body aches with a history of kidney transplant 4 years ago  EMS reported BP 110/70, Pulse 88, Resp 16 CBG 70  Has not been eating  Last time this happened she came to the hospital received fluids and was discharged.  Patient states she is starting Dialysis on Monday

## 2015-11-17 ENCOUNTER — Emergency Department (HOSPITAL_COMMUNITY)
Admission: EM | Admit: 2015-11-17 | Discharge: 2015-11-17 | Disposition: A | Discharge status: Home / Self Care | Payer: Medicare Other | Attending: Emergency Medicine | Admitting: Emergency Medicine

## 2015-11-17 DIAGNOSIS — E860 Dehydration: Secondary | ICD-10-CM | POA: Diagnosis not present

## 2015-11-17 LAB — CBG MONITORING, ED
GLUCOSE-CAPILLARY: 93 mg/dL (ref 65–99)
Glucose-Capillary: 68 mg/dL (ref 65–99)
Glucose-Capillary: 82 mg/dL (ref 65–99)

## 2015-11-17 LAB — URINALYSIS, ROUTINE W REFLEX MICROSCOPIC
Bilirubin Urine: NEGATIVE
Glucose, UA: NEGATIVE mg/dL
Hgb urine dipstick: NEGATIVE
Ketones, ur: NEGATIVE mg/dL
NITRITE: NEGATIVE
PROTEIN: 30 mg/dL — AB
Specific Gravity, Urine: 1.015 (ref 1.005–1.030)
pH: 5.5 (ref 5.0–8.0)

## 2015-11-17 LAB — URINE MICROSCOPIC-ADD ON: RBC / HPF: NONE SEEN RBC/hpf (ref 0–5)

## 2015-11-17 MED ORDER — SODIUM CHLORIDE 0.9 % IV BOLUS (SEPSIS)
500.0000 mL | Freq: Once | INTRAVENOUS | Status: AC
  Administered 2015-11-17: 500 mL via INTRAVENOUS

## 2015-11-17 NOTE — ED Notes (Signed)
Yellow socks applied to pt  

## 2015-11-17 NOTE — ED Notes (Signed)
Pt wheeled to restroom.

## 2015-11-17 NOTE — ED Notes (Signed)
Dr. Pollina at bedside   

## 2015-11-17 NOTE — ED Notes (Signed)
Pt states that she urinated prior to coming to the emergency department.

## 2015-11-17 NOTE — Discharge Instructions (Signed)

## 2015-11-17 NOTE — ED Provider Notes (Signed)
CSN: [id number]     Arrival date & time 11/16/15  2219 History  By signing my name below, I, Rosario AdieWilliam Andrew Hiatt, attest that this documentation has been prepared under the direction and in the presence of Gilda Creasehristopher J Pollina, MD.  Electronically Signed: Rosario AdieWilliam Andrew Hiatt, ED Scribe. 11/17/2015. 12:49 AM.   Chief Complaint  Patient presents with  . Generalized Body Aches   The history is provided by the patient and a relative. No language interpreter was used.   HPI Comments: April Baarslgie H Casey is a 82 y.o. female with a PMHx of DM, HTN, and CKD who presents to the Emergency Department complaining of generalized myalgias of her back and lower extremities that began 1 day ago. Pt has had associated blurry vision and dizziness. She has been not eating food or drinking fluids because she has not had any appetite and this has been a problem "for a while" per her son. She has a hx of similar sx and was last seen on 11/11/15, and was given IV fluids.  She has a PSHx of a kidney transplant. Pt reports that she is having dialysis 1 week from today for her CKD. She is currently taking 21units of injectable insulin for her DM, and reports being compliant with her medications. She notes that movement exacerbates her body aches. No alleviating factors noted at this time. She reports that she also has had blurry vision Pt denies vomiting, dysuria, hematuria, frequency, urgency, or diarrhea.  Past Medical History  Diagnosis Date  . Gout   . CKD (chronic kidney disease)   . Diabetes (HCC)   . TIA (transient ischemic attack)   . Dyslipidemia   . Anemia   . HTN (hypertension)   . PVD (peripheral vascular disease) (HCC)   . Orthostatic hypotension 06/12/2014  . Family history of adverse reaction to anesthesia     daughter has difficulty waking    Past Surgical History  Procedure Laterality Date  . Breast biopsy    . Tubal ligation    . Kidney transplant    . Av shunt for dialyisis     Family History   Problem Relation Age of Onset  . Hypertension Father   . Stroke Father   . Hypertension Mother   . Stroke Mother   . Diabetes Brother   . Heart attack Brother   . Heart failure Sister   . Diabetes Sister   . Heart attack Brother   . Cancer Brother    Social History  Substance Use Topics  . Smoking status: Never Smoker   . Smokeless tobacco: Never Used  . Alcohol Use: No   OB History    No data available     Review of Systems  Constitutional: Positive for appetite change (Loss of apetite).  Gastrointestinal: Negative for nausea and vomiting.  Genitourinary: Negative for dysuria, urgency, frequency, hematuria and decreased urine volume.  Musculoskeletal: Positive for myalgias.  Neurological: Positive for dizziness.  All other systems reviewed and are negative.  Allergies  Amoxicillin and Codeine  Home Medications   Prior to Admission medications   Medication Sig Start Date End Date Taking? Authorizing Provider  acetaminophen (TYLENOL) 325 MG tablet Take 650 mg by mouth every 6 (six) hours as needed (pain).   Yes Historical Provider, MD  allopurinol (ZYLOPRIM) 100 MG tablet Take 200 mg by mouth daily.    Yes Historical Provider, MD  aspirin 81 MG tablet Take 81 mg by mouth daily.   Yes Historical Provider,  MD  calcitRIOL (ROCALTROL) 0.25 MCG capsule Take 0.25 mcg by mouth daily.   Yes Historical Provider, MD  gabapentin (NEURONTIN) 100 MG capsule Take 100 mg by mouth 3 (three) times daily.   Yes Historical Provider, MD  insulin glargine (LANTUS) 100 UNIT/ML injection Inject 0.15 mLs (15 Units total) into the skin at bedtime. Patient taking differently: Inject 21 Units into the skin at bedtime.  11/02/14  Yes Jeralyn Bennett, MD  labetalol (NORMODYNE) 200 MG tablet Take 200 mg by mouth every morning.  04/03/14  Yes Historical Provider, MD  levothyroxine (SYNTHROID, LEVOTHROID) 25 MCG tablet Take 50 mcg by mouth daily before breakfast.    Yes Historical Provider, MD   magnesium oxide (MAG-OX) 400 MG tablet Take 400 mg by mouth daily.   Yes Historical Provider, MD  mycophenolate (MYFORTIC) 180 MG EC tablet Take 180 mg by mouth 2 (two) times daily.   Yes Historical Provider, MD  rosuvastatin (CRESTOR) 20 MG tablet Take 20 mg by mouth 3 (three) times a week. Monday, Wednesday and Friday   Yes Historical Provider, MD  tacrolimus (PROGRAF) 1 MG capsule Take 0.5-2 mg by mouth 3 (three) times daily. 2 mg twice daily and 0.5 mg at bedtime   Yes Historical Provider, MD  Vitamin D, Ergocalciferol, (DRISDOL) 50000 units CAPS capsule Take 50,000 Units by mouth 2 (two) times a week. Monday and Thursday   Yes Historical Provider, MD   BP 140/55 mmHg  Pulse 92  Temp(Src) 98.3 F (36.8 C) (Oral)  Resp 20  SpO2 99%   Physical Exam  Constitutional: She is oriented to person, place, and time. She appears well-developed and well-nourished. No distress.  HENT:  Head: Normocephalic and atraumatic.  Right Ear: Hearing normal.  Left Ear: Hearing normal.  Nose: Nose normal.  Mouth/Throat: Oropharynx is clear and moist and mucous membranes are normal.  Eyes: Conjunctivae and EOM are normal. Pupils are equal, round, and reactive to light.  Neck: Normal range of motion. Neck supple.  Cardiovascular: Normal rate, regular rhythm, S1 normal, S2 normal and normal heart sounds.  Exam reveals no gallop and no friction rub.   No murmur heard. Pulmonary/Chest: Effort normal and breath sounds normal. No respiratory distress. She exhibits no tenderness.  Abdominal: Soft. Normal appearance and bowel sounds are normal. There is no hepatosplenomegaly. There is no tenderness. There is no rebound, no guarding, no tenderness at McBurney's point and negative Murphy's sign. No hernia.  Musculoskeletal: Normal range of motion.  Neurological: She is alert and oriented to person, place, and time. She has normal strength. No cranial nerve deficit or sensory deficit. Coordination normal. GCS eye  subscore is 4. GCS verbal subscore is 5. GCS motor subscore is 6.  Skin: Skin is warm, dry and intact. No rash noted. No cyanosis.  Psychiatric: She has a normal mood and affect. Her speech is normal and behavior is normal. Thought content normal.  Nursing note and vitals reviewed.   ED Course  Procedures (including critical care time) DIAGNOSTIC STUDIES: Oxygen Saturation is 100% on RA, normal by my interpretation.   COORDINATION OF CARE: 12:27 AM-Discussed next steps with pt including CBC, UA, CMP, and IV fluids. Pt verbalized understanding and is agreeable with the plan.   Labs Review Labs Reviewed  CBC WITH DIFFERENTIAL/PLATELET - Abnormal; Notable for the following:    Hemoglobin 10.2 (*)    HCT 34.8 (*)    MCHC 29.3 (*)    All other components within normal limits  COMPREHENSIVE METABOLIC  PANEL - Abnormal; Notable for the following:    Glucose, Bld 64 (*)    BUN 37 (*)    Creatinine, Ser 5.79 (*)    Total Protein 6.3 (*)    Albumin 3.3 (*)    ALT 9 (*)    GFR calc non Af Amer 6 (*)    GFR calc Af Amer 7 (*)    All other components within normal limits  URINALYSIS, ROUTINE W REFLEX MICROSCOPIC (NOT AT [facility])  CBG MONITORING, ED  CBG MONITORING, ED   I have personally reviewed and evaluated these lab results as part of my medical decision-making.   EKG Interpretation   Date/Time:  Monday November 16 2015 22:33:23 EDT Ventricular Rate:  94 PR Interval:  152 QRS Duration: 74 QT Interval:  412 QTC Calculation: 515 R Axis:   -33 Text Interpretation:  Normal sinus rhythm Left axis deviation Left  ventricular hypertrophy Nonspecific T wave abnormality Prolonged QT  Abnormal ECG No significant change since last tracing Confirmed by POLLINA   MD, CHRISTOPHER ([id number]) on 11/17/2015 1:42:44 AM      MDM   Final diagnoses:  None  dehydration  Patient presents to the emergency department with multiple complaints. She has been experiencing weakness and dizziness. She has  had nausea without vomiting. Patient reports a history of kidney transplant 4 years ago. Her renal function has progressively worsened and she has recently decided to go back on dialysis. She has an appointment tomorrow to be seen and will start dialysis 5 days later. Patient's creatinine has actually improved since she was seen a week ago. She does not appear to be significantly uremic. She has not been eating or drinking much at home and this is likely a large portion of her problem. She says that nothing tastes good to her. She has had problems with nausea when she eats but has not been vomiting. She has been given a prescription for "a nausea medicine" but has only taken 1 tablet.  Review of her records reveals that she was seen in the emergency department one week ago and was hypotensive at that time. She is normotensive to slightly hypertensive today. She was administered IV fluids here in the ER for presumed mild dehydration secondary to poor oral intake. She does feel better after IV fluids, has ambulated without difficulty here in the emergency department.  She was counseled that she needs hydrate herself and to take in nutrition if she is going to take her insulin. She did have somewhat low glucose here in the emergency department. She was able to eat a small amount of food and drink some soda and her blood sugar has maintained.   I do feel that a lot of her current presentation is chronic. She appears to be somewhat depressed. She does, however, have worsening renal function and there is likely some element of uremia present causing some of her current symptoms, but this is unchanged from prior. She does not require hospitalization at this time. She is encouraged to eat and drink as much as she can and see her nephrologist tomorrow as scheduled.  I personally performed the services described in this documentation, which was scribed in my presence. The recorded information has been reviewed and is  accurate.\    Gilda Crease, MD 11/17/15 [phone]

## 2015-11-19 ENCOUNTER — Other Ambulatory Visit (HOSPITAL_COMMUNITY): Payer: Self-pay | Admitting: *Deleted

## 2015-11-20 ENCOUNTER — Inpatient Hospital Stay (HOSPITAL_COMMUNITY): Admission: RE | Admit: 2015-11-20 | Payer: Medicare Other | Source: Ambulatory Visit

## 2015-12-14 ENCOUNTER — Other Ambulatory Visit (HOSPITAL_COMMUNITY): Payer: Self-pay | Admitting: Nurse Practitioner

## 2015-12-14 DIAGNOSIS — T82868A Thrombosis of vascular prosthetic devices, implants and grafts, initial encounter: Secondary | ICD-10-CM

## 2015-12-15 ENCOUNTER — Other Ambulatory Visit (HOSPITAL_COMMUNITY): Payer: Self-pay | Admitting: Nurse Practitioner

## 2015-12-15 ENCOUNTER — Other Ambulatory Visit: Payer: Self-pay | Admitting: Radiology

## 2015-12-15 ENCOUNTER — Ambulatory Visit (HOSPITAL_COMMUNITY)
Admission: RE | Admit: 2015-12-15 | Discharge: 2015-12-15 | Disposition: A | Discharge status: Home / Self Care | Payer: Medicare Other | Source: Ambulatory Visit | Attending: Nurse Practitioner | Admitting: Nurse Practitioner

## 2015-12-15 ENCOUNTER — Encounter (HOSPITAL_COMMUNITY): Payer: Self-pay

## 2015-12-15 DIAGNOSIS — T8612 Kidney transplant failure: Secondary | ICD-10-CM | POA: Insufficient documentation

## 2015-12-15 DIAGNOSIS — Z853 Personal history of malignant neoplasm of breast: Secondary | ICD-10-CM | POA: Insufficient documentation

## 2015-12-15 DIAGNOSIS — T82868A Thrombosis of vascular prosthetic devices, implants and grafts, initial encounter: Secondary | ICD-10-CM

## 2015-12-15 DIAGNOSIS — Z823 Family history of stroke: Secondary | ICD-10-CM | POA: Diagnosis not present

## 2015-12-15 DIAGNOSIS — I129 Hypertensive chronic kidney disease with stage 1 through stage 4 chronic kidney disease, or unspecified chronic kidney disease: Secondary | ICD-10-CM | POA: Diagnosis not present

## 2015-12-15 DIAGNOSIS — T85868A Thrombosis due to other internal prosthetic devices, implants and grafts, initial encounter: Secondary | ICD-10-CM

## 2015-12-15 DIAGNOSIS — Z880 Allergy status to penicillin: Secondary | ICD-10-CM | POA: Diagnosis not present

## 2015-12-15 DIAGNOSIS — Z992 Dependence on renal dialysis: Secondary | ICD-10-CM | POA: Diagnosis not present

## 2015-12-15 DIAGNOSIS — E1122 Type 2 diabetes mellitus with diabetic chronic kidney disease: Secondary | ICD-10-CM | POA: Insufficient documentation

## 2015-12-15 DIAGNOSIS — D649 Anemia, unspecified: Secondary | ICD-10-CM | POA: Diagnosis not present

## 2015-12-15 DIAGNOSIS — N189 Chronic kidney disease, unspecified: Secondary | ICD-10-CM | POA: Diagnosis not present

## 2015-12-15 DIAGNOSIS — Z885 Allergy status to narcotic agent status: Secondary | ICD-10-CM | POA: Insufficient documentation

## 2015-12-15 DIAGNOSIS — E1140 Type 2 diabetes mellitus with diabetic neuropathy, unspecified: Secondary | ICD-10-CM | POA: Diagnosis not present

## 2015-12-15 DIAGNOSIS — E1151 Type 2 diabetes mellitus with diabetic peripheral angiopathy without gangrene: Secondary | ICD-10-CM | POA: Diagnosis not present

## 2015-12-15 DIAGNOSIS — Z794 Long term (current) use of insulin: Secondary | ICD-10-CM | POA: Insufficient documentation

## 2015-12-15 DIAGNOSIS — Z7982 Long term (current) use of aspirin: Secondary | ICD-10-CM | POA: Insufficient documentation

## 2015-12-15 DIAGNOSIS — Z8249 Family history of ischemic heart disease and other diseases of the circulatory system: Secondary | ICD-10-CM | POA: Diagnosis not present

## 2015-12-15 DIAGNOSIS — M109 Gout, unspecified: Secondary | ICD-10-CM | POA: Insufficient documentation

## 2015-12-15 DIAGNOSIS — Y832 Surgical operation with anastomosis, bypass or graft as the cause of abnormal reaction of the patient, or of later complication, without mention of misadventure at the time of the procedure: Secondary | ICD-10-CM | POA: Insufficient documentation

## 2015-12-15 DIAGNOSIS — E785 Hyperlipidemia, unspecified: Secondary | ICD-10-CM | POA: Diagnosis not present

## 2015-12-15 DIAGNOSIS — Z833 Family history of diabetes mellitus: Secondary | ICD-10-CM | POA: Insufficient documentation

## 2015-12-15 DIAGNOSIS — Z8673 Personal history of transient ischemic attack (TIA), and cerebral infarction without residual deficits: Secondary | ICD-10-CM | POA: Diagnosis not present

## 2015-12-15 LAB — BASIC METABOLIC PANEL
ANION GAP: 14 (ref 5–15)
BUN: 18 mg/dL (ref 6–20)
CALCIUM: 9.7 mg/dL (ref 8.9–10.3)
CO2: 27 mmol/L (ref 22–32)
Chloride: 92 mmol/L — ABNORMAL LOW (ref 101–111)
Creatinine, Ser: 8.37 mg/dL — ABNORMAL HIGH (ref 0.44–1.00)
GFR, EST AFRICAN AMERICAN: 5 mL/min — AB (ref >60)
GFR, EST NON AFRICAN AMERICAN: 4 mL/min — AB (ref >60)
GLUCOSE: 161 mg/dL — AB (ref 65–99)
Potassium: 3.2 mmol/L — ABNORMAL LOW (ref 3.5–5.1)
SODIUM: 133 mmol/L — AB (ref 135–145)

## 2015-12-15 LAB — CBC
HCT: 29.2 % — ABNORMAL LOW (ref 36.0–46.0)
HEMOGLOBIN: 9.1 g/dL — AB (ref 12.0–15.0)
MCH: 27.8 pg (ref 26.0–34.0)
MCHC: 31.2 g/dL (ref 30.0–36.0)
MCV: 89.3 fL (ref 78.0–100.0)
Platelets: 225 10*3/uL (ref 150–400)
RBC: 3.27 MIL/uL — AB (ref 3.87–5.11)
RDW: 15.7 % — ABNORMAL HIGH (ref 11.5–15.5)
WBC: 5.1 10*3/uL (ref 4.0–10.5)

## 2015-12-15 LAB — APTT: APTT: 34 s (ref 24–37)

## 2015-12-15 LAB — PROTIME-INR
INR: 1.08 (ref 0.00–1.49)
PROTHROMBIN TIME: 14.2 s (ref 11.6–15.2)

## 2015-12-15 MED ORDER — HEPARIN SODIUM (PORCINE) 1000 UNIT/ML IJ SOLN
INTRAMUSCULAR | Status: AC | PRN
  Administered 2015-12-15: 4000 [IU] via INTRAVENOUS

## 2015-12-15 MED ORDER — FENTANYL CITRATE (PF) 100 MCG/2ML IJ SOLN
INTRAMUSCULAR | Status: AC | PRN
  Administered 2015-12-15: 50 ug via INTRAVENOUS

## 2015-12-15 MED ORDER — MIDAZOLAM HCL 2 MG/2ML IJ SOLN
INTRAMUSCULAR | Status: AC
  Filled 2015-12-15: qty 4

## 2015-12-15 MED ORDER — MIDAZOLAM HCL 2 MG/2ML IJ SOLN
INTRAMUSCULAR | Status: AC | PRN
  Administered 2015-12-15: 1 mg via INTRAVENOUS

## 2015-12-15 MED ORDER — FENTANYL CITRATE (PF) 100 MCG/2ML IJ SOLN
INTRAMUSCULAR | Status: AC
  Filled 2015-12-15: qty 4

## 2015-12-15 MED ORDER — LIDOCAINE HCL 1 % IJ SOLN
INTRAMUSCULAR | Status: DC | PRN
  Administered 2015-12-15: 10 mL

## 2015-12-15 MED ORDER — ALTEPLASE 2 MG IJ SOLR
INTRAMUSCULAR | Status: AC
  Filled 2015-12-15: qty 4

## 2015-12-15 MED ORDER — LIDOCAINE HCL 1 % IJ SOLN
INTRAMUSCULAR | Status: AC
  Filled 2015-12-15: qty 20

## 2015-12-15 MED ORDER — IOPAMIDOL (ISOVUE-300) INJECTION 61%
INTRAVENOUS | Status: AC
  Administered 2015-12-15: 50 mL
  Filled 2015-12-15: qty 100

## 2015-12-15 MED ORDER — ALTEPLASE 2 MG IJ SOLR
INTRAMUSCULAR | Status: AC | PRN
  Administered 2015-12-15: 2 mg

## 2015-12-15 MED ORDER — SODIUM CHLORIDE 0.9 % IV SOLN: INTRAVENOUS | Status: DC

## 2015-12-15 MED ORDER — HEPARIN SODIUM (PORCINE) 1000 UNIT/ML IJ SOLN
INTRAMUSCULAR | Status: DC
Start: 2015-12-15 — End: 2015-12-16
  Filled 2015-12-15: qty 1

## 2015-12-15 NOTE — H&P (Signed)
Chief Complaint: Patient was seen in consultation today for left upper arm dialysis graft thrombolysis at the request of Pola Corn  Referring Physician(s): Pola Corn  Supervising Physician: Simonne Come  Patient Status: Outpatient  History of Present Illness: April Casey is a 82 y.o. female   Hx Breast Ca DM; TIA; HTN Renal failure; dual kidney Rt pelvis Left upper arm dialysis graft placed 12 yrs ago per pts dtr. Placed prior to renal transplant in 2013 Biopsy 09/2012 showed progression of ischemic nephropathy Followed with Nephrology regularly Increasing creatinine noted early 2017 Renal bx 09/2015 revealed acute tubular necrosis Started back on graft hemodialysis now over 1 month Left upper arm dialysis graft was working well even Fr 12/11/15 Mon 7/10 access was clotted per dialysis center Referred to IR for thrombolysis with possible angioplasty/stnet placement Possible tunneled dialysis catheter placement if needed. Last IR intervention 2011; L forearm graft: successful declot    Past Medical History  Diagnosis Date  . Gout   . CKD (chronic kidney disease)   . Diabetes (HCC)   . TIA (transient ischemic attack)   . Dyslipidemia   . Anemia   . HTN (hypertension)   . PVD (peripheral vascular disease) (HCC)   . Orthostatic hypotension 06/12/2014  . Family history of adverse reaction to anesthesia     daughter has difficulty waking     Past Surgical History  Procedure Laterality Date  . Breast biopsy    . Tubal ligation    . Kidney transplant    . Av shunt for dialyisis      Allergies: Amoxicillin and Codeine  Medications: Prior to Admission medications   Medication Sig Start Date End Date Taking? Authorizing Provider  acetaminophen (TYLENOL) 325 MG tablet Take 650 mg by mouth every 6 (six) hours as needed (pain).    Historical Provider, MD  allopurinol (ZYLOPRIM) 100 MG tablet Take 200 mg by mouth daily.     Historical Provider, MD    aspirin 81 MG tablet Take 81 mg by mouth daily.    Historical Provider, MD  calcitRIOL (ROCALTROL) 0.25 MCG capsule Take 0.25 mcg by mouth daily.    Historical Provider, MD  gabapentin (NEURONTIN) 100 MG capsule Take 100 mg by mouth 3 (three) times daily.    Historical Provider, MD  insulin glargine (LANTUS) 100 UNIT/ML injection Inject 0.15 mLs (15 Units total) into the skin at bedtime. Patient taking differently: Inject 21 Units into the skin at bedtime.  11/02/14   Jeralyn Bennett, MD  labetalol (NORMODYNE) 200 MG tablet Take 200 mg by mouth every morning.  04/03/14   Historical Provider, MD  levothyroxine (SYNTHROID, LEVOTHROID) 25 MCG tablet Take 50 mcg by mouth daily before breakfast.     Historical Provider, MD  magnesium oxide (MAG-OX) 400 MG tablet Take 400 mg by mouth daily.    Historical Provider, MD  mycophenolate (MYFORTIC) 180 MG EC tablet Take 180 mg by mouth 2 (two) times daily.    Historical Provider, MD  rosuvastatin (CRESTOR) 20 MG tablet Take 20 mg by mouth 3 (three) times a week. Monday, Wednesday and Friday    Historical Provider, MD  tacrolimus (PROGRAF) 1 MG capsule Take 0.5-2 mg by mouth 3 (three) times daily. 2 mg twice daily and 0.5 mg at bedtime    Historical Provider, MD  Vitamin D, Ergocalciferol, (DRISDOL) 50000 units CAPS capsule Take 50,000 Units by mouth 2 (two) times a week. Monday and Thursday    Historical Provider, MD  Family History  Problem Relation Age of Onset  . Hypertension Father   . Stroke Father   . Hypertension Mother   . Stroke Mother   . Diabetes Brother   . Heart attack Brother   . Heart failure Sister   . Diabetes Sister   . Heart attack Brother   . Cancer Brother     Social History   Social History  . Marital Status: Married    Spouse Name: N/A  . Number of Children: 12  . Years of Education: N/A   Social History Main Topics  . Smoking status: Never Smoker   . Smokeless tobacco: Never Used  . Alcohol Use: No  . Drug Use:  No  . Sexual Activity: Not Asked   Other Topics Concern  . None   Social History Narrative   Patient is right handed   Patient drinks one cup caffeine daily.        Review of Systems: A 12 point ROS discussed and pertinent positives are indicated in the HPI above.  All other systems are negative.  Review of Systems  Constitutional: Positive for activity change, appetite change and fatigue. Negative for fever.  Respiratory: Negative for cough and shortness of breath.   Cardiovascular: Negative for chest pain.  Musculoskeletal: Positive for gait problem.  Neurological: Positive for weakness.  Psychiatric/Behavioral: Negative for behavioral problems and confusion.    Vital Signs: BP 121/67 mmHg  Pulse 71  Temp(Src) 98.5 F (36.9 C) (Oral)  Resp 16  Ht 5\' 3"  (1.6 m)  Wt 144 lb (65.318 kg)  BMI 25.51 kg/m2  SpO2 93%  Physical Exam  Constitutional: She is oriented to person, place, and time.  Cardiovascular: Normal rate.   Murmur heard. Pulmonary/Chest: Effort normal and breath sounds normal. She has no wheezes.  Abdominal: Soft. Bowel sounds are normal.  Musculoskeletal: Normal range of motion.  LUA graft no pulse or thrill  Neurological: She is alert and oriented to person, place, and time.  Skin: Skin is warm and dry.  Psychiatric: She has a normal mood and affect. Her behavior is normal. Judgment and thought content normal.  Nursing note and vitals reviewed.   Mallampati Score:  MD Evaluation Airway: WNL Heart: WNL Abdomen: WNL Chest/ Lungs: WNL ASA  Classification: 3 Mallampati/Airway Score: Two  Imaging: No results found.  Labs:  CBC:  Recent Labs  06/20/15 2021  11/06/15 0916 11/11/15 2105 11/16/15 2242 12/15/15 1133  WBC 6.5  --   --  4.4 4.8 5.1  HGB 9.2*  < > 9.5* 9.2* 10.2* 9.1*  HCT 30.4*  --   --  31.6* 34.8* 29.2*  PLT 191  --   --  237 249 225  < > = values in this interval not displayed.  COAGS:  Recent Labs  12/19/14 0835  01/15/15 0055 01/15/15 0630 12/15/15 1133  INR 1.43 1.25  --  1.08  APTT 37  --  32 34    BMP:  Recent Labs  01/18/15 0735 06/20/15 2021 11/11/15 2105 11/16/15 2242  NA 143 139 138 143  K 4.2 4.3 3.9 4.3  CL 114* 101 105 105  CO2 18* 27 22 26   GLUCOSE 69 442* 116* 64*  BUN 69* 37* 50* 37*  CALCIUM 9.3 9.3 10.0 10.3  CREATININE 4.21* 3.00* 7.34* 5.79*  GFRNONAA 9* 14* 5* 6*  GFRAA 11* 16* 5* 7*    LIVER FUNCTION TESTS:  Recent Labs  01/15/15 0630 01/16/15 0656 06/20/15 2021 11/11/15  2105 11/16/15 2242  BILITOT 1.0  --  0.5 0.7 0.5  AST 20  --  19 22 21   ALT 9*  --  14 11* 9*  ALKPHOS 44  --  91 57 58  PROT 4.8*  --  5.9* 6.2* 6.3*  ALBUMIN 2.3* 2.4* 3.3* 3.3* 3.3*    TUMOR MARKERS: No results for input(s): AFPTM, CEA, CA199, CHROMGRNA in the last 8760 hours.  Assessment and Plan:  Left upper arm dialysis graft clotted Scheduled for thrombolysis with poss pta/stent placement Possible tunneled dialysis catheter placement if needed Risks and Benefits discussed with the patient including, but not limited to bleeding, infection, vascular injury, pulmonary embolism, need for tunneled HD catheter placement or even death. All of the patient's questions were answered, patient is agreeable to proceed. Consent signed and in chart.  Thank you for this interesting consult.  I greatly enjoyed meeting April Casey and look forward to participating in their care.  A copy of this report was sent to the requesting provider on this date.  Electronically Signed: Ralene Muskrat A 12/15/2015, 12:19 PM   I spent a total of  30 Minutes   in face to face in clinical consultation, greater than 50% of which was counseling/coordinating care for LUA dialysis graft declot

## 2015-12-15 NOTE — Sedation Documentation (Signed)
Patient is resting comfortably. 

## 2015-12-15 NOTE — Procedures (Signed)
Technically successful declot of of left upper arm dialysis graft requiring placement of an overlapping venous anastomotic stent. EBL: Trace No immediate post procedural complications. Access is ready for immediate use.  Katherina RightJay Menna Abeln, MD Pager #: [phone]

## 2015-12-15 NOTE — Discharge Instructions (Signed)
Fistulogram, Care After °Refer to this sheet in the next few weeks. These instructions provide you with information on caring for yourself after your procedure. Your health care provider may also give you more specific instructions. Your treatment has been planned according to current medical practices, but problems sometimes occur. Call your health care provider if you have any problems or questions after your procedure. °WHAT TO EXPECT AFTER THE PROCEDURE °After your procedure, it is typical to have the following: °· A small amount of discomfort in the area where the catheters were placed. °· A small amount of bruising around the fistula. °· Sleepiness and fatigue. °HOME CARE INSTRUCTIONS °· Rest at home for the day following your procedure. °· Do not drive or operate heavy machinery while taking pain medicine. °· Take medicines only as directed by your health care provider. °· Do not take baths, swim, or use a hot tub until your health care provider approves. You may shower 24 hours after the procedure or as directed by your health care provider. °· There are many different ways to close and cover an incision, including stitches, skin glue, and adhesive strips. Follow your health care provider's instructions on: °¨ Incision care. °¨ Bandage (dressing) changes and removal. °¨ Incision closure removal. °· Monitor your dialysis fistula carefully. °SEEK MEDICAL CARE IF: °· You have drainage, redness, swelling, or pain at your catheter site. °· You have a fever. °· You have chills. °SEEK IMMEDIATE MEDICAL CARE IF: °· You feel weak. °· You have trouble balancing. °· You have trouble moving your arms or legs. °· You have problems with your speech or vision. °· You can no longer feel a vibration or buzz when you put your fingers over your dialysis fistula. °· The limb that was used for the procedure: °¨ Swells. °¨ Is painful. °¨ Is cold. °¨ Is discolored, such as blue or pale white. °  °This information is not intended  to replace advice given to you by your health care provider. Make sure you discuss any questions you have with your health care provider. °  °Document Released: 10/07/2013 Document Reviewed: 10/07/2013 °Elsevier Interactive Patient Education ©2016 [employer] ° °

## 2015-12-16 ENCOUNTER — Encounter (HOSPITAL_COMMUNITY): Payer: Self-pay | Admitting: Emergency Medicine

## 2015-12-16 ENCOUNTER — Emergency Department (HOSPITAL_COMMUNITY)
Admission: EM | Admit: 2015-12-16 | Discharge: 2015-12-16 | Disposition: A | Discharge status: Home / Self Care | Payer: Medicare Other | Attending: Emergency Medicine | Admitting: Emergency Medicine

## 2015-12-16 ENCOUNTER — Emergency Department (HOSPITAL_COMMUNITY): Payer: Medicare Other

## 2015-12-16 DIAGNOSIS — E1122 Type 2 diabetes mellitus with diabetic chronic kidney disease: Secondary | ICD-10-CM | POA: Insufficient documentation

## 2015-12-16 DIAGNOSIS — Z8673 Personal history of transient ischemic attack (TIA), and cerebral infarction without residual deficits: Secondary | ICD-10-CM | POA: Diagnosis not present

## 2015-12-16 DIAGNOSIS — I129 Hypertensive chronic kidney disease with stage 1 through stage 4 chronic kidney disease, or unspecified chronic kidney disease: Secondary | ICD-10-CM | POA: Insufficient documentation

## 2015-12-16 DIAGNOSIS — R441 Visual hallucinations: Secondary | ICD-10-CM

## 2015-12-16 DIAGNOSIS — R4182 Altered mental status, unspecified: Secondary | ICD-10-CM | POA: Diagnosis present

## 2015-12-16 DIAGNOSIS — N189 Chronic kidney disease, unspecified: Secondary | ICD-10-CM | POA: Insufficient documentation

## 2015-12-16 DIAGNOSIS — Z794 Long term (current) use of insulin: Secondary | ICD-10-CM | POA: Insufficient documentation

## 2015-12-16 LAB — COMPREHENSIVE METABOLIC PANEL
ALBUMIN: 3.1 g/dL — AB (ref 3.5–5.0)
ALK PHOS: 72 U/L (ref 38–126)
ALT: 12 U/L — ABNORMAL LOW (ref 14–54)
ANION GAP: 14 (ref 5–15)
AST: 29 U/L (ref 15–41)
BILIRUBIN TOTAL: 0.9 mg/dL (ref 0.3–1.2)
BUN: 20 mg/dL (ref 6–20)
CALCIUM: 9.7 mg/dL (ref 8.9–10.3)
CO2: 27 mmol/L (ref 22–32)
Chloride: 92 mmol/L — ABNORMAL LOW (ref 101–111)
Creatinine, Ser: 9.18 mg/dL — ABNORMAL HIGH (ref 0.44–1.00)
GFR calc Af Amer: 4 mL/min — ABNORMAL LOW (ref >60)
GFR calc non Af Amer: 4 mL/min — ABNORMAL LOW (ref >60)
GLUCOSE: 142 mg/dL — AB (ref 65–99)
Potassium: 3 mmol/L — ABNORMAL LOW (ref 3.5–5.1)
Sodium: 133 mmol/L — ABNORMAL LOW (ref 135–145)
TOTAL PROTEIN: 6.3 g/dL — AB (ref 6.5–8.1)

## 2015-12-16 LAB — URINALYSIS, ROUTINE W REFLEX MICROSCOPIC
GLUCOSE, UA: NEGATIVE mg/dL
Ketones, ur: 15 mg/dL — AB
Nitrite: NEGATIVE
PH: 5.5 (ref 5.0–8.0)
PROTEIN: 100 mg/dL — AB
Specific Gravity, Urine: 1.026 (ref 1.005–1.030)

## 2015-12-16 LAB — CBC
HCT: 32.4 % — ABNORMAL LOW (ref 36.0–46.0)
Hemoglobin: 9.7 g/dL — ABNORMAL LOW (ref 12.0–15.0)
MCH: 26.6 pg (ref 26.0–34.0)
MCHC: 29.9 g/dL — AB (ref 30.0–36.0)
MCV: 89 fL (ref 78.0–100.0)
PLATELETS: 239 10*3/uL (ref 150–400)
RBC: 3.64 MIL/uL — ABNORMAL LOW (ref 3.87–5.11)
RDW: 15.4 % (ref 11.5–15.5)
WBC: 5.5 10*3/uL (ref 4.0–10.5)

## 2015-12-16 LAB — URINE MICROSCOPIC-ADD ON

## 2015-12-16 LAB — TROPONIN I: TROPONIN I: 0.06 ng/mL — AB (ref <0.03)

## 2015-12-16 LAB — LIPASE, BLOOD: LIPASE: 15 U/L (ref 11–51)

## 2015-12-16 LAB — ETHANOL: Alcohol, Ethyl (B): <5 mg/dL (ref <5)

## 2015-12-16 NOTE — ED Notes (Signed)
Assisted pt getting dressed. [facility] out to lobby with Family

## 2015-12-16 NOTE — ED Notes (Signed)
Onset 3 days ago patient lives with sister who stated to EMS after taking tramadol patient started to have intermittent altered mental status and hallucinations by seeing people not present.  Has dialysis M/W/F Last dialysis Monday sister canceled appointment today and for patient to be evaluated today.  Currently patient alert answering and following commands appropriate.

## 2015-12-16 NOTE — Discharge Instructions (Signed)
As discussed, your evaluation today has been largely reassuring.  But, it is important that you monitor your condition carefully, and do not hesitate to return to the ED if you develop new, or concerning changes in your condition.  Otherwise, please follow-up with your physician for appropriate ongoing care.  Please be sure to go to dialysis tomorrow as well.  A secondary test is being conducted on your urinalysis to confirm the absence of infection. If this test result is positive, you will be made aware.   Please use Tylenol for pain control, do not take additional tramadol.

## 2015-12-16 NOTE — ED Notes (Signed)
Family upset at d/c. Family wanted pt admitted for dialysis. Md notified.

## 2015-12-16 NOTE — ED Provider Notes (Signed)
CSN: [id number]     Arrival date & time 12/16/15  1017 History   First MD Initiated Contact with Patient 12/16/15 1029     Chief Complaint  Patient presents with  . Altered Mental Status    HPI  Patient presents with concern of change in mental status. Patient is here with her daughter. They note that over the past 48 hours the patient has had visual hallucinations, interactions with non-present individuals. Patient acknowledges these interactions as occurring, denies other auditory hallucination, other new changes in her thought pattern. Notably, these symptoms have occurred as she recently began having dialysis again. Patient's history includes prior renal failure, kidney transplant, and again returning to dialysis about 3 weeks ago. Currently the patient is taking tramadol for chronic pain, no other new medication, diet, activity changes. Currently the patient denies pain, dyspnea, confusion, disorientation.   Past Medical History  Diagnosis Date  . Gout   . CKD (chronic kidney disease)   . Diabetes ([place])   . TIA (transient ischemic attack)   . Dyslipidemia   . Anemia   . HTN (hypertension)   . PVD (peripheral vascular disease) ([place])   . Orthostatic hypotension 06/12/2014  . Family history of adverse reaction to anesthesia     daughter has difficulty waking    Past Surgical History  Procedure Laterality Date  . Breast biopsy    . Tubal ligation    . Kidney transplant    . Av shunt for dialyisis     Family History  Problem Relation Age of Onset  . Hypertension Father   . Stroke Father   . Hypertension Mother   . Stroke Mother   . Diabetes Brother   . Heart attack Brother   . Heart failure Sister   . Diabetes Sister   . Heart attack Brother   . Cancer Brother    Social History  Substance Use Topics  . Smoking status: Never Smoker   . Smokeless tobacco: Never Used  . Alcohol Use: No   OB History    No data available     Review of Systems  Constitutional:         Per HPI, otherwise negative  HENT:       Per HPI, otherwise negative  Respiratory:       Per HPI, otherwise negative  Cardiovascular:       Per HPI, otherwise negative  Gastrointestinal: Negative for vomiting.  Endocrine:       Negative aside from HPI  Genitourinary:       Neg aside from HPI   Musculoskeletal:       Per HPI, otherwise negative  Skin: Negative.   Neurological: Negative for syncope.  Psychiatric/Behavioral: Positive for confusion and sleep disturbance.      Allergies  Amoxicillin and Codeine  Home Medications   Prior to Admission medications   Medication Sig Start Date End Date Taking? Authorizing Provider  acetaminophen (TYLENOL) 325 MG tablet Take 650 mg by mouth every 6 (six) hours as needed (pain).    Historical Provider, MD  allopurinol (ZYLOPRIM) 100 MG tablet Take 200 mg by mouth daily.     Historical Provider, MD  gabapentin (NEURONTIN) 100 MG capsule Take 100 mg by mouth at bedtime.     Historical Provider, MD  insulin glargine (LANTUS) 100 UNIT/ML injection Inject 0.15 mLs (15 Units total) into the skin at bedtime. Patient taking differently: Inject 21 Units into the skin at bedtime.  11/02/14   Kelvin Cellar,  MD  labetalol (NORMODYNE) 200 MG tablet Take 100 mg by mouth daily as needed (BP elevation). Exceeds 140 take 100 mg 04/03/14   Historical Provider, MD  levothyroxine (SYNTHROID, LEVOTHROID) 25 MCG tablet Take 50 mcg by mouth daily before breakfast.     Historical Provider, MD  mycophenolate (MYFORTIC) 180 MG EC tablet Take 180 mg by mouth 2 (two) times daily.    Historical Provider, MD  rosuvastatin (CRESTOR) 20 MG tablet Take 20 mg by mouth 3 (three) times a week. Monday, Wednesday and Friday    Historical Provider, MD  tacrolimus (PROGRAF) 1 MG capsule Take 2 mg by mouth 2 (two) times daily.     Historical Provider, MD  traMADol (ULTRAM) 50 MG tablet Take 50 mg by mouth every 6 (six) hours as needed for moderate pain.    Historical  Provider, MD  Vitamin D, Ergocalciferol, (DRISDOL) 50000 units CAPS capsule Take 50,000 Units by mouth every 7 (seven) days. Monday    Historical Provider, MD   BP 120/55 mmHg  Pulse 92  Temp(Src) 97.9 F (36.6 C) (Oral)  Resp 16  Ht _0  (1.6 m)  Wt 140 lb (63.504 kg)  BMI 24.81 kg/m2  SpO2 90% Physical Exam  Constitutional: She is oriented to person, place, and time. She appears well-developed and well-nourished. No distress.  HENT:  Head: Normocephalic and atraumatic.  Eyes: Conjunctivae and EOM are normal.  Cardiovascular: Normal rate and regular rhythm.   Pulmonary/Chest: Effort normal and breath sounds normal. No stridor. No respiratory distress.  Abdominal: She exhibits no distension.  Musculoskeletal: She exhibits no edema.       Arms: Neurological: She is alert and oriented to person, place, and time. No cranial nerve deficit.  Skin: Skin is warm and dry.  Psychiatric: She has a normal mood and affect. Thought content is not paranoid and not delusional. Cognition and memory are not impaired. She does not express impulsivity.  Patient has insight into recent visual hallucinations, no ongoing interaction with external stimuli.  Nursing note and vitals reviewed.   ED Course  Procedures (including critical care time) Labs Review Labs Reviewed  COMPREHENSIVE METABOLIC PANEL - Abnormal; Notable for the following:    Sodium 133 (*)    Potassium 3.0 (*)    Chloride 92 (*)    Glucose, Bld 142 (*)    Creatinine, Ser 9.18 (*)    Total Protein 6.3 (*)    Albumin 3.1 (*)    ALT 12 (*)    GFR calc non Af Amer 4 (*)    GFR calc Af Amer 4 (*)    All other components within normal limits  CBC - Abnormal; Notable for the following:    RBC 3.64 (*)    Hemoglobin 9.7 (*)    HCT 32.4 (*)    MCHC 29.9 (*)    All other components within normal limits  TROPONIN I - Abnormal; Notable for the following:    Troponin I 0.06 (*)    All other components within normal limits    URINALYSIS, ROUTINE W REFLEX MICROSCOPIC (NOT AT [facility]) - Abnormal; Notable for the following:    Color, Urine AMBER (*)    APPearance HAZY (*)    Hgb urine dipstick SMALL (*)    Bilirubin Urine MODERATE (*)    Ketones, ur 15 (*)    Protein, ur 100 (*)    Leukocytes, UA SMALL (*)    All other components within normal limits  URINE MICROSCOPIC-ADD ON -  Abnormal; Notable for the following:    Squamous Epithelial / LPF 6-30 (*)    Bacteria, UA FEW (*)    Casts HYALINE CASTS (*)    All other components within normal limits  ETHANOL  LIPASE, BLOOD    Imaging Review Dg Chest 2 View  12/16/2015  CLINICAL DATA:  Altered level of consciousness with hallucinations EXAM: CHEST  2 VIEW COMPARISON:  January 15, 2015 FINDINGS: There is no appreciable edema or consolidation. Heart size and pulmonary vascularity are normal. No adenopathy. There is atherosclerotic calcification in the aorta. There is a stent in the left axillary region. No bone lesions. No pneumothorax. IMPRESSION: Aortic atherosclerosis.  No edema or consolidation. Electronically Signed   By: Lowella Grip III M.D.   On: 12/16/2015 11:43   Ct Head Wo Contrast  12/16/2015  CLINICAL DATA:  Altered mental status/ visual hallucinations EXAM: CT HEAD WITHOUT CONTRAST TECHNIQUE: Contiguous axial images were obtained from the base of the skull through the vertex without intravenous contrast. COMPARISON:  Head CT January 15, 2015 and brain MRI March 13, 2015 FINDINGS: Brain: There is mild generalized atrophy, stable. There is no intracranial mass, hemorrhage, extra-axial fluid collection, or midline shift. There is patchy small vessel disease in the centra semiovale bilaterally. There are patchy lacunar infarcts in the cerebellar hemispheres. No acute infarct is demonstrable on this study. Vascular: There is calcification in the distal vertebral arteries, more pronounced on the left than on the right. There is extensive cavernous carotid artery  calcification bilaterally. No hyperdense vessels are evident. Skull: The bony calvarium appears intact. Sinuses/Orbits: Orbits appear symmetric bilaterally. There is mucosal thickening in several ethmoid air cells bilaterally. Visualized paranasal sinuses elsewhere clear. Other: Visualized mastoid air cells are clear. IMPRESSION: Atrophy with patchy supratentorial infratentorial small vessel disease. No acute infarct evident. No hemorrhage or mass effect. There are multiple foci of vascular calcification. There is mucosal thickening in several ethmoid air cells bilaterally. Head Electronically Signed   By: Lowella Grip III M.D.   On: 12/16/2015 11:41   Ir US Guide Vasc Access Left  12/15/2015  INDICATION: Clotted graft. Patient with history of recently failed transplanted kidney recently re-initiated on hemodialysis via a chronic left upper arm dialysis graft which has been successfully use for dialysis for the past month. EXAM: 1. FISTULALYSIS 2. ANGIOPLASTY OF VENOUS LIMB AND VENOUS ANASTOMOSIS 3. ULTRASOUND GUIDANCE FOR VENOUS ACCESS COMPARISON:  None - this is the first intervention on the patient's left upper arm dialysis graft MEDICATIONS: Heparin 4000 units IV; TPA 2 mg into graft. CONTRAST:  50 cc Isovue 300 ANESTHESIA/SEDATION: Moderate (conscious) sedation was employed during this procedure. A total of Versed 1 mg and Fentanyl 50 mcg was administered intravenously. Moderate Sedation Time: 50 minutes. The patient's level of consciousness and vital signs were monitored continuously by radiology nursing throughout the procedure under my direct supervision. FLUOROSCOPY TIME:  12 minutes.  6  seconds (36 mGy) COMPLICATIONS: None immediate. TECHNIQUE: Informed written consent was obtained from the patient after a discussion of the risks, benefits and alternatives to treatment. Questions regarding the procedure were encouraged and answered. A timeout was performed prior to the initiation of the procedure.  On physical examination, the existing left upper arm arm dialysis graft was negative for palpable pulse or thrill. The skin overlying the graft was prepped and draped in the usual sterile fashion, and a sterile drape was applied covering the operative field. Maximum barrier sterile technique with sterile gowns and gloves were  used for the procedure. Under ultrasound guidance, the dialysis graft was accessed directed towards the venous anastomosis with a micropuncture kit after the overlying soft tissues were anesthetized with 1% lidocaine. An ultrasound image was saved for documentation purposes. The micropuncture sheath was exchange for a 7-French vascular sheath over a guidewire. Over a Benson wire, a Kumpe catheter was advanced centrally, through the previously placed overlapping venous anastomotic stents and a central venogram was performed. Pull back venogram was performed with the Kumpe catheter. Heparin was administered systemically and TPA was administered via the Kumpe catheter throughout near the entirety of the venous limb. The venous anastomosis and majority of the venous limb was angioplastied with a 6 mm x 4 cm Conquest balloon. An additional access was obtained directed towards the arterial anastomoses with a micropuncture kit after the overlying soft tissues anesthetized with 1% lidocaine. This allowed for placement of a 6-French vascular sheath. The graft was thrombectomized with several rounds of push-pull mechanical thrombectomy with an occlusion balloon. Flow was restored to the graft as evidenced by restored pulsatility of the graft and brisk blood return from the side arm of the vascular sheath. Shuntograms were performed. At this point, a Rosen wire was placed centrally via the Kumpe catheter and an un covered 8 mm x 40 mm smart stent was deployed overlapping the previously collapsed segment of the venous anastomotic stent. The stent was subsequently balloon angioplastied to 6 mm diameter.  Completion shuntogram images were obtained in the procedure was terminated. All wires, catheters and sheaths were removed from the patient. Hemostasis was achieved at both access sites with deployment of a swizzle sutures which will be removed at the patient's next dialysis session. Dressings were placed. The patient tolerated the procedure without immediate postprocedural complication. FINDINGS: The existing left upper arm AV graft is thrombosed secondary to near complete collapse of the peripheral aspect of the previously placed overlapping venous anastomotic stents. The venous anastomosis and near the entirety of the venous limb was angioplastied to 6 mm diameter. The graft was successfully thrombectomized using mechanical and pharmacologic means as above. Given the near complete collapse of the peripheral aspect of the previously placed overlapping venous anastomotic stent, the decision was made to place a new overlapping uncovered stent to (hopefully) ensure graft functionality given the high likelihood of recurrence stent collapse. As such, an 8 mm x 40 mm uncovered overlapping stent was deployed across peripheral aspect of the venous anastomotic stent and subsequently balloon angioplastied to 6 mm diameter. Completion shuntogram images demonstrate a technically excellent result without evidence of a residual hemodynamically significant stenosis or complication, specifically, no evidence of vessel dissection or contrast extravasation. The remainder the venous limb is widely patent. The central venous system, arterial limb and arterial anastomosis are widely patent. IMPRESSION: 1. Technically successful left upper arm AV graft thrombolysis. 2. Successful angioplasty of the venous anastomosis and majority of the venous limb to 6 mm diameter. 3. Successful fluoroscopic deployment of an overlapping 8 mm x 40 mm uncovered venous anastomotic stent. Completion shuntogram demonstrates the venous limb and anastomosis  are widely patent. 4. The arterial anastomosis and arterial limb are widely patent. 5. The central venous system is widely patent. ACCESS: This graft would be amenable to future percutaneous intervention as clinically indicated. Electronically Signed   By: Sandi Mariscal M.D.   On: 12/15/2015 14:34   Ir Thrombectomy Av Fistula W/thrombolysis/pta/stent Inc Shunt Img Lt  12/15/2015  INDICATION: Clotted graft. Patient with history of recently failed transplanted  kidney recently re-initiated on hemodialysis via a chronic left upper arm dialysis graft which has been successfully use for dialysis for the past month. EXAM: 1. FISTULALYSIS 2. ANGIOPLASTY OF VENOUS LIMB AND VENOUS ANASTOMOSIS 3. ULTRASOUND GUIDANCE FOR VENOUS ACCESS COMPARISON:  None - this is the first intervention on the patient's left upper arm dialysis graft MEDICATIONS: Heparin 4000 units IV; TPA 2 mg into graft. CONTRAST:  50 cc Isovue 300 ANESTHESIA/SEDATION: Moderate (conscious) sedation was employed during this procedure. A total of Versed 1 mg and Fentanyl 50 mcg was administered intravenously. Moderate Sedation Time: 50 minutes. The patient's level of consciousness and vital signs were monitored continuously by radiology nursing throughout the procedure under my direct supervision. FLUOROSCOPY TIME:  12 minutes.  6  seconds (36 mGy) COMPLICATIONS: None immediate. TECHNIQUE: Informed written consent was obtained from the patient after a discussion of the risks, benefits and alternatives to treatment. Questions regarding the procedure were encouraged and answered. A timeout was performed prior to the initiation of the procedure. On physical examination, the existing left upper arm arm dialysis graft was negative for palpable pulse or thrill. The skin overlying the graft was prepped and draped in the usual sterile fashion, and a sterile drape was applied covering the operative field. Maximum barrier sterile technique with sterile gowns and gloves  were used for the procedure. Under ultrasound guidance, the dialysis graft was accessed directed towards the venous anastomosis with a micropuncture kit after the overlying soft tissues were anesthetized with 1% lidocaine. An ultrasound image was saved for documentation purposes. The micropuncture sheath was exchange for a 7-French vascular sheath over a guidewire. Over a Benson wire, a Kumpe catheter was advanced centrally, through the previously placed overlapping venous anastomotic stents and a central venogram was performed. Pull back venogram was performed with the Kumpe catheter. Heparin was administered systemically and TPA was administered via the Kumpe catheter throughout near the entirety of the venous limb. The venous anastomosis and majority of the venous limb was angioplastied with a 6 mm x 4 cm Conquest balloon. An additional access was obtained directed towards the arterial anastomoses with a micropuncture kit after the overlying soft tissues anesthetized with 1% lidocaine. This allowed for placement of a 6-French vascular sheath. The graft was thrombectomized with several rounds of push-pull mechanical thrombectomy with an occlusion balloon. Flow was restored to the graft as evidenced by restored pulsatility of the graft and brisk blood return from the side arm of the vascular sheath. Shuntograms were performed. At this point, a Rosen wire was placed centrally via the Kumpe catheter and an un covered 8 mm x 40 mm smart stent was deployed overlapping the previously collapsed segment of the venous anastomotic stent. The stent was subsequently balloon angioplastied to 6 mm diameter. Completion shuntogram images were obtained in the procedure was terminated. All wires, catheters and sheaths were removed from the patient. Hemostasis was achieved at both access sites with deployment of a swizzle sutures which will be removed at the patient's next dialysis session. Dressings were placed. The patient  tolerated the procedure without immediate postprocedural complication. FINDINGS: The existing left upper arm AV graft is thrombosed secondary to near complete collapse of the peripheral aspect of the previously placed overlapping venous anastomotic stents. The venous anastomosis and near the entirety of the venous limb was angioplastied to 6 mm diameter. The graft was successfully thrombectomized using mechanical and pharmacologic means as above. Given the near complete collapse of the peripheral aspect of the previously placed  overlapping venous anastomotic stent, the decision was made to place a new overlapping uncovered stent to (hopefully) ensure graft functionality given the high likelihood of recurrence stent collapse. As such, an 8 mm x 40 mm uncovered overlapping stent was deployed across peripheral aspect of the venous anastomotic stent and subsequently balloon angioplastied to 6 mm diameter. Completion shuntogram images demonstrate a technically excellent result without evidence of a residual hemodynamically significant stenosis or complication, specifically, no evidence of vessel dissection or contrast extravasation. The remainder the venous limb is widely patent. The central venous system, arterial limb and arterial anastomosis are widely patent. IMPRESSION: 1. Technically successful left upper arm AV graft thrombolysis. 2. Successful angioplasty of the venous anastomosis and majority of the venous limb to 6 mm diameter. 3. Successful fluoroscopic deployment of an overlapping 8 mm x 40 mm uncovered venous anastomotic stent. Completion shuntogram demonstrates the venous limb and anastomosis are widely patent. 4. The arterial anastomosis and arterial limb are widely patent. 5. The central venous system is widely patent. ACCESS: This graft would be amenable to future percutaneous intervention as clinically indicated. Electronically Signed   By: Sandi Mariscal M.D.   On: 12/15/2015 14:34   I have personally  reviewed and evaluated these images and lab results as part of my medical decision-making.   EKG Interpretation   Date/Time:  Wednesday December 16 2015 10:28:43 EDT Ventricular Rate:  94 PR Interval:    QRS Duration: 88 QT Interval:  408 QTC Calculation: 511 R Axis:   -29 Text Interpretation:  Sinus rhythm LVH with secondary repolarization  abnormality Anterior infarct, old Prolonged QT interval No significant  change since last tracing Abnormal ekg Confirmed by Carmin Muskrat  MD  [id number]) on 12/16/2015 11:42:12 AM     2:18 PM Patient sleeping. Results discussed with the patient's family members. Absent no new complaints, the patient will follow up with dialysis tomorrow.  MDM  Elderly female with recent resumption of dialysis, ongoing use of tramadol presents with concern for visual hallucination. No other evidence for acute psychosis, and evaluation is largely reassuring, with no evidence of ongoing infection, substantial electrolyte abnormality, ongoing coronary ischemia. Patient encouraged to switch medication from tramadol, and to be a hallucinogenic in elderly patients, to Tylenol, to follow-up with dialysis tomorrow.   Carmin Muskrat, MD 12/16/15 1421

## 2015-12-16 NOTE — ED Notes (Signed)
Beside toilet given

## 2015-12-17 ENCOUNTER — Other Ambulatory Visit (HOSPITAL_COMMUNITY): Payer: Self-pay | Admitting: Nurse Practitioner

## 2015-12-17 DIAGNOSIS — T82868A Thrombosis of vascular prosthetic devices, implants and grafts, initial encounter: Secondary | ICD-10-CM

## 2015-12-17 DIAGNOSIS — T85868A Thrombosis due to other internal prosthetic devices, implants and grafts, initial encounter: Secondary | ICD-10-CM

## 2016-01-26 ENCOUNTER — Encounter (HOSPITAL_COMMUNITY): Payer: Self-pay

## 2016-01-26 ENCOUNTER — Emergency Department (HOSPITAL_COMMUNITY): Payer: Medicare Other

## 2016-01-26 ENCOUNTER — Emergency Department (HOSPITAL_COMMUNITY)
Admission: EM | Admit: 2016-01-26 | Discharge: 2016-01-26 | Disposition: A | Discharge status: Home / Self Care | Payer: Medicare Other | Attending: Emergency Medicine | Admitting: Emergency Medicine

## 2016-01-26 DIAGNOSIS — E1122 Type 2 diabetes mellitus with diabetic chronic kidney disease: Secondary | ICD-10-CM | POA: Diagnosis not present

## 2016-01-26 DIAGNOSIS — Z79899 Other long term (current) drug therapy: Secondary | ICD-10-CM | POA: Diagnosis not present

## 2016-01-26 DIAGNOSIS — G8929 Other chronic pain: Secondary | ICD-10-CM

## 2016-01-26 DIAGNOSIS — M25532 Pain in left wrist: Secondary | ICD-10-CM | POA: Diagnosis not present

## 2016-01-26 DIAGNOSIS — M542 Cervicalgia: Secondary | ICD-10-CM | POA: Diagnosis not present

## 2016-01-26 DIAGNOSIS — M79645 Pain in left finger(s): Secondary | ICD-10-CM | POA: Diagnosis not present

## 2016-01-26 DIAGNOSIS — Z8673 Personal history of transient ischemic attack (TIA), and cerebral infarction without residual deficits: Secondary | ICD-10-CM | POA: Diagnosis not present

## 2016-01-26 DIAGNOSIS — Z992 Dependence on renal dialysis: Secondary | ICD-10-CM | POA: Insufficient documentation

## 2016-01-26 DIAGNOSIS — N183 Chronic kidney disease, stage 3 (moderate): Secondary | ICD-10-CM | POA: Insufficient documentation

## 2016-01-26 DIAGNOSIS — I129 Hypertensive chronic kidney disease with stage 1 through stage 4 chronic kidney disease, or unspecified chronic kidney disease: Secondary | ICD-10-CM | POA: Insufficient documentation

## 2016-01-26 LAB — I-STAT CHEM 8, ED
BUN: 26 mg/dL — AB (ref 6–20)
CHLORIDE: 103 mmol/L (ref 101–111)
CREATININE: 5.3 mg/dL — AB (ref 0.44–1.00)
Calcium, Ion: 1.12 mmol/L (ref 1.12–1.23)
Glucose, Bld: 71 mg/dL (ref 65–99)
HEMATOCRIT: 36 % (ref 36.0–46.0)
HEMOGLOBIN: 12.2 g/dL (ref 12.0–15.0)
Potassium: 3.7 mmol/L (ref 3.5–5.1)
SODIUM: 141 mmol/L (ref 135–145)
TCO2: 28 mmol/L (ref 0–100)

## 2016-01-26 LAB — CBC WITH DIFFERENTIAL/PLATELET
BASOS PCT: 1 %
Basophils Absolute: 0 10*3/uL (ref 0.0–0.1)
EOS ABS: 0.2 10*3/uL (ref 0.0–0.7)
Eosinophils Relative: 4 %
HEMATOCRIT: 35.9 % — AB (ref 36.0–46.0)
HEMOGLOBIN: 10.6 g/dL — AB (ref 12.0–15.0)
LYMPHS ABS: 1.6 10*3/uL (ref 0.7–4.0)
Lymphocytes Relative: 31 %
MCH: 28.5 pg (ref 26.0–34.0)
MCHC: 29.5 g/dL — ABNORMAL LOW (ref 30.0–36.0)
MCV: 96.5 fL (ref 78.0–100.0)
MONOS PCT: 14 %
Monocytes Absolute: 0.7 10*3/uL (ref 0.1–1.0)
NEUTROS ABS: 2.6 10*3/uL (ref 1.7–7.7)
NEUTROS PCT: 50 %
Platelets: 160 10*3/uL (ref 150–400)
RBC: 3.72 MIL/uL — AB (ref 3.87–5.11)
RDW: 17.3 % — ABNORMAL HIGH (ref 11.5–15.5)
WBC: 5 10*3/uL (ref 4.0–10.5)

## 2016-01-26 LAB — BASIC METABOLIC PANEL
ANION GAP: 9 (ref 5–15)
BUN: 24 mg/dL — ABNORMAL HIGH (ref 6–20)
CALCIUM: 9.3 mg/dL (ref 8.9–10.3)
CHLORIDE: 105 mmol/L (ref 101–111)
CO2: 25 mmol/L (ref 22–32)
CREATININE: 5.38 mg/dL — AB (ref 0.44–1.00)
GFR calc non Af Amer: 7 mL/min — ABNORMAL LOW (ref >60)
GFR, EST AFRICAN AMERICAN: 8 mL/min — AB (ref >60)
Glucose, Bld: 81 mg/dL (ref 65–99)
Potassium: 3.6 mmol/L (ref 3.5–5.1)
SODIUM: 139 mmol/L (ref 135–145)

## 2016-01-26 MED ORDER — PREDNISONE 20 MG PO TABS
40.0000 mg | ORAL_TABLET | Freq: Every day | ORAL | 0 refills | Status: AC
Start: 2016-01-26 — End: ?

## 2016-01-26 NOTE — ED Notes (Signed)
Dr. Little at bedside.  

## 2016-01-26 NOTE — ED Triage Notes (Signed)
Per [facility]: Pt is coming from home. Pt has missed two dialysis appointments due to "generalized pain". Pts last dialysis was last Wednesday. She usually has dialysis Monday, Wednesday, and Friday. Pt is also having left thumb and hand pain, pt states that she does not remember hurting her hand. The hand is swollen. Pt states her gabapentin has not "touched my pain".

## 2016-01-26 NOTE — Discharge Instructions (Signed)
Please make sure you attend your dialysis session tomorrow. Follow up with your primary care provider for reassessment of urinary and left hand pain. Seek immediate medical attention for fever, redness or warmth of your left hand, shortness of breath, or chest pain.

## 2016-01-26 NOTE — ED Notes (Signed)
Patient transported to X-ray 

## 2016-01-26 NOTE — ED Provider Notes (Signed)
[facility]-EMERGENCY DEPT Provider Note   CSN: [id number] Arrival date & time: 01/26/16  1159     History   Chief Complaint No chief complaint on file.   HPI April Casey is a 82 y.o. female.  82yo F w/ PMH including ESRD w/ failing transplanted kidney on dialysis M/W/F, T2DM, HTN, PVD, gout who p/w pain and missed dialysis. The patient reports that over the past one week, she has missed 2 different dialysis sessions because she was in too much pain. She states that the pain is "all over" and involves her neck, back, and extremities. Recently she has noticed some swelling and pain in her left thumb and wrist. The pain is severe and she reports that gabapentin has not improved her symptoms. She denies any trauma to her hands. Last dialysis session was 6 days ago. She denies any shortness of breath or chest pain. No fevers. She reports occasional vomiting and diarrhea which she has had chronically. Family notes that she has had problems with decreased appetite and is currently on medication to address this problem. No focal abdominal pain.   The history is provided by a relative.    Past Medical History:  Diagnosis Date  . Anemia   . CKD (chronic kidney disease)   . Diabetes (HCC)   . Dyslipidemia   . Family history of adverse reaction to anesthesia    daughter has difficulty waking   . Gout   . HTN (hypertension)   . Orthostatic hypotension 06/12/2014  . PVD (peripheral vascular disease) (HCC)   . TIA (transient ischemic attack)     Patient Active Problem List   Diagnosis Date Noted  . Sepsis (HCC) 01/15/2015  . UTI (lower urinary tract infection) 01/15/2015  . Encephalopathy 01/15/2015  . Malnutrition of moderate degree (HCC) 01/15/2015  . Acute encephalopathy 12/19/2014  . Protein-calorie malnutrition, severe (HCC) 12/19/2014  . Diabetes mellitus without complication (HCC)   . Fever 12/18/2014  . Acute kidney injury (HCC) 10/31/2014  . Hypoglycemia 10/31/2014  . Dizziness  and giddiness 06/12/2014  . Subjective visual disturbance 06/12/2014  . Orthostatic hypotension 06/12/2014  . Atherosclerosis of native arteries of the extremities with intermittent claudication 04/19/2013  . Swelling 04/19/2013  . Gout   . Acute renal failure superimposed on stage 3 chronic kidney disease (HCC)   . Diabetes (HCC)   . TIA (transient ischemic attack)   . Dyslipidemia   . Anemia   . HTN (hypertension)     Past Surgical History:  Procedure Laterality Date  . AV shunt for dialyisis    . BREAST BIOPSY    . KIDNEY TRANSPLANT    . TUBAL LIGATION      OB History    No data available       Home Medications    Prior to Admission medications   Medication Sig Start Date End Date Taking? Authorizing Provider  acetaminophen (TYLENOL) 325 MG tablet Take 650 mg by mouth every 6 (six) hours as needed (pain).   Yes Historical Provider, MD  clotrimazole (MYCELEX) 10 MG troche Take 10 mg by mouth 3 (three) times daily. As needed for thrush. For 10 days, started on 01-25-16   Yes Historical Provider, MD  gabapentin (NEURONTIN) 300 MG capsule Take 300 mg by mouth at bedtime.   Yes Historical Provider, MD  mirtazapine (REMERON) 15 MG tablet Take 15 mg by mouth at bedtime.   Yes Historical Provider, MD  tacrolimus (PROGRAF) 1 MG capsule Take 2 mg by mouth  2 (two) times daily.    Yes Historical Provider, MD  allopurinol (ZYLOPRIM) 100 MG tablet Take 200 mg by mouth daily.     Historical Provider, MD  labetalol (NORMODYNE) 200 MG tablet Take 100 mg by mouth daily as needed (BP elevation). Exceeds 140 take 100 mg 04/03/14   Historical Provider, MD  levothyroxine (SYNTHROID, LEVOTHROID) 25 MCG tablet Take 50 mcg by mouth daily before breakfast.     Historical Provider, MD  mycophenolate (MYFORTIC) 180 MG EC tablet Take 180 mg by mouth 2 (two) times daily.    Historical Provider, MD  predniSONE (DELTASONE) 20 MG tablet Take 2 tablets (40 mg total) by mouth daily. 01/26/16   Laurence Spates, MD  rosuvastatin (CRESTOR) 20 MG tablet Take 20 mg by mouth 3 (three) times a week. Monday, Wednesday and Friday    Historical Provider, MD  Vitamin D, Ergocalciferol, (DRISDOL) 50000 units CAPS capsule Take 50,000 Units by mouth every 7 (seven) days. Monday    Historical Provider, MD    Family History Family History  Problem Relation Age of Onset  . Hypertension Father   . Stroke Father   . Hypertension Mother   . Stroke Mother   . Diabetes Brother   . Heart attack Brother   . Heart failure Sister   . Diabetes Sister   . Heart attack Brother   . Cancer Brother     Social History Social History  Substance Use Topics  . Smoking status: Never Smoker  . Smokeless tobacco: Never Used  . Alcohol use No     Allergies   Amoxicillin and Codeine   Review of Systems Review of Systems 10 Systems reviewed and are negative for acute change except as noted in the HPI.   Physical Exam Updated Vital Signs BP (!) 122/42   Pulse 94   Temp 99.7 F (37.6 C) (Oral)   Resp 19   SpO2 95%   Physical Exam  Constitutional: She is oriented to person, place, and time. She appears well-developed and well-nourished. No distress.  Frail, elderly woman awake and alert  HENT:  Head: Normocephalic and atraumatic.  Moist mucous membranes  Eyes: Conjunctivae are normal. Pupils are equal, round, and reactive to light.  Neck: Neck supple.  Cardiovascular: Normal rate and regular rhythm.   Murmur heard.  Systolic murmur is present with a grade of 3/6  Pulmonary/Chest: Effort normal and breath sounds normal. She has no rales.  Abdominal: Soft. Bowel sounds are normal. She exhibits no distension. There is no tenderness.  Musculoskeletal: She exhibits edema and tenderness.  Mild swelling of L thumb MCP joint and wrist w/ TTP, no deformity, no redness or warmth  Neurological: She is alert and oriented to person, place, and time.  Normal distal sensation, Fluent speech  Skin: Skin is warm  and dry. Capillary refill takes less than 2 seconds. No rash noted. No erythema.  Psychiatric: She has a normal mood and affect. Judgment normal.  Nursing note and vitals reviewed.    ED Treatments / Results  Labs (all labs ordered are listed, but only abnormal results are displayed) Labs Reviewed  BASIC METABOLIC PANEL - Abnormal; Notable for the following:       Result Value   BUN 24 (*)    Creatinine, Ser 5.38 (*)    GFR calc non Af Amer 7 (*)    GFR calc Af Amer 8 (*)    All other components within normal limits  CBC WITH DIFFERENTIAL/PLATELET -  Abnormal; Notable for the following:    RBC 3.72 (*)    Hemoglobin 10.6 (*)    HCT 35.9 (*)    MCHC 29.5 (*)    RDW 17.3 (*)    All other components within normal limits  I-STAT CHEM 8, ED - Abnormal; Notable for the following:    BUN 26 (*)    Creatinine, Ser 5.30 (*)    All other components within normal limits    EKG  EKG Interpretation  Date/Time:  Tuesday January 26 2016 12:09:37 EDT Ventricular Rate:  97 PR Interval:    QRS Duration: 77 QT Interval:  316 QTC Calculation: 402 R Axis:   -20 Text Interpretation:  Sinus rhythm Atrial premature complex Borderline left axis deviation Anterior infarct, old No significant change since last tracing Confirmed by Tenoch Mcclure MD, Breon Rehm [phone]) on 01/26/2016 12:37:31 PM       Radiology Dg Wrist Complete Left  Result Date: 01/26/2016 CLINICAL DATA:  Pain thumb and wrist.  No injury EXAM: LEFT WRIST - COMPLETE 3+ VIEW COMPARISON:  04/07/2011 FINDINGS: Mild degenerative change of the base of thumb. Wrist joints are normal. No erosion or fracture. Arterial calcification. 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 Electronically Signed   By: Marlan Palau M.D.   On: 01/26/2016 15:32   Dg Hand Complete Left  Result Date: 01/26/2016 CLINICAL DATA:  Left thumb and wrist pain. History of gout. No injury EXAM: LEFT HAND - COMPLETE 3+ VIEW COMPARISON:  04/07/2011  FINDINGS: Mild degenerative change in the first interphalangeal joint with cartilage loss and spurring. Small periarticular erosion at the base of the second proximal phalanx unchanged from the prior study. This is not typical for gout. Question rheumatoid arthritis. Negative for fracture.  No other erosions.  Arterial calcification. 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 Not characteristic for gout. Electronically Signed   By: Marlan Palau M.D.   On: 01/26/2016 15:31    Procedures Procedures (including critical care time)  Medications Ordered in ED Medications - No data to display   Initial Impression / Assessment and Plan / ED Course  I have reviewed the triage vital signs and the nursing notes.  Pertinent labs & imaging results that were available during my care of the patient were reviewed by me and considered in my medical decision making (see chart for details).  Clinical Course   Patient with ESRD on dialysis, still taking immunosuppressants for failing transplanted kidney, presents with generalized pain, to missed dialysis sessions, and left hand pain. She was uncomfortable but in no acute distress on exam. Vital signs unremarkable, patient was afebrile O2 saturation 98-99% on room air. Normal work of breathing and clear breath sounds bilaterally. No evidence of volume overload. She had some swelling of her thumb MCP joint and wrists but no erythema, skin changes, or induration to suggest infectious process. Her labs here show normal potassium, normal WBC count. She does not appear to have any respiratory symptoms from volume overload and given her normal potassium, I do not feel she needs emergent dialysis.  Imaging of left hand and wrist shows degenerative changes with no acute findings. I suspect osteoarthritis versus gout and have discussed risks and benefits of treatment with steroids,  will avoid NSAIDS due to kidney disease. Family voiced understanding and felt comfortable with steroid course. Reviewed return precautions including any signs of infection.  Patient also noted several other  complaints including chronic neck pain. Daughter was concerned about possibility of stroke. She has normal sensation of bilateral hands and normal strength bilaterally. She reports that the pain is chronic. I have instructed to follow-up with PCP for consideration of MRI of cervical spine. Extensively reviewed return precautions. Family voiced understanding and patient was discharged in satisfactory condition.  Final Clinical Impressions(s) / ED Diagnoses   Final diagnoses:  Thumb pain, left  Left wrist pain  Neck pain, chronic    New Prescriptions New Prescriptions   PREDNISONE (DELTASONE) 20 MG TABLET    Take 2 tablets (40 mg total) by mouth daily.     Laurence Spatesachel Morgan Siboney Requejo, MD 01/26/16 [phone]

## 2016-01-27 ENCOUNTER — Other Ambulatory Visit (HOSPITAL_COMMUNITY): Payer: Self-pay | Admitting: Nephrology

## 2016-01-27 ENCOUNTER — Other Ambulatory Visit: Payer: Self-pay | Admitting: General Surgery

## 2016-01-27 DIAGNOSIS — N186 End stage renal disease: Secondary | ICD-10-CM

## 2016-01-27 DIAGNOSIS — Z992 Dependence on renal dialysis: Principal | ICD-10-CM

## 2016-01-28 ENCOUNTER — Ambulatory Visit (HOSPITAL_COMMUNITY)
Admission: RE | Admit: 2016-01-28 | Discharge: 2016-01-28 | Disposition: A | Discharge status: Home / Self Care | Payer: Medicare Other | Source: Ambulatory Visit | Attending: Nephrology | Admitting: Nephrology

## 2016-01-28 ENCOUNTER — Other Ambulatory Visit (HOSPITAL_COMMUNITY): Payer: Self-pay | Admitting: Nephrology

## 2016-01-28 ENCOUNTER — Encounter (HOSPITAL_COMMUNITY): Payer: Self-pay

## 2016-01-28 ENCOUNTER — Other Ambulatory Visit: Payer: Self-pay | Admitting: Radiology

## 2016-01-28 DIAGNOSIS — Z885 Allergy status to narcotic agent status: Secondary | ICD-10-CM | POA: Insufficient documentation

## 2016-01-28 DIAGNOSIS — N186 End stage renal disease: Secondary | ICD-10-CM

## 2016-01-28 DIAGNOSIS — M109 Gout, unspecified: Secondary | ICD-10-CM | POA: Insufficient documentation

## 2016-01-28 DIAGNOSIS — Z8673 Personal history of transient ischemic attack (TIA), and cerebral infarction without residual deficits: Secondary | ICD-10-CM | POA: Insufficient documentation

## 2016-01-28 DIAGNOSIS — I951 Orthostatic hypotension: Secondary | ICD-10-CM | POA: Diagnosis not present

## 2016-01-28 DIAGNOSIS — Z992 Dependence on renal dialysis: Secondary | ICD-10-CM | POA: Diagnosis not present

## 2016-01-28 DIAGNOSIS — Y832 Surgical operation with anastomosis, bypass or graft as the cause of abnormal reaction of the patient, or of later complication, without mention of misadventure at the time of the procedure: Secondary | ICD-10-CM | POA: Insufficient documentation

## 2016-01-28 DIAGNOSIS — T82858A Stenosis of vascular prosthetic devices, implants and grafts, initial encounter: Secondary | ICD-10-CM | POA: Insufficient documentation

## 2016-01-28 DIAGNOSIS — D649 Anemia, unspecified: Secondary | ICD-10-CM | POA: Diagnosis not present

## 2016-01-28 DIAGNOSIS — I120 Hypertensive chronic kidney disease with stage 5 chronic kidney disease or end stage renal disease: Secondary | ICD-10-CM | POA: Insufficient documentation

## 2016-01-28 DIAGNOSIS — Z880 Allergy status to penicillin: Secondary | ICD-10-CM | POA: Insufficient documentation

## 2016-01-28 DIAGNOSIS — Z833 Family history of diabetes mellitus: Secondary | ICD-10-CM | POA: Insufficient documentation

## 2016-01-28 DIAGNOSIS — Z823 Family history of stroke: Secondary | ICD-10-CM | POA: Insufficient documentation

## 2016-01-28 DIAGNOSIS — M19049 Primary osteoarthritis, unspecified hand: Secondary | ICD-10-CM | POA: Insufficient documentation

## 2016-01-28 DIAGNOSIS — E1151 Type 2 diabetes mellitus with diabetic peripheral angiopathy without gangrene: Secondary | ICD-10-CM | POA: Diagnosis not present

## 2016-01-28 DIAGNOSIS — E785 Hyperlipidemia, unspecified: Secondary | ICD-10-CM | POA: Diagnosis not present

## 2016-01-28 DIAGNOSIS — Z8249 Family history of ischemic heart disease and other diseases of the circulatory system: Secondary | ICD-10-CM | POA: Insufficient documentation

## 2016-01-28 DIAGNOSIS — T82868A Thrombosis of vascular prosthetic devices, implants and grafts, initial encounter: Secondary | ICD-10-CM | POA: Diagnosis present

## 2016-01-28 DIAGNOSIS — E1122 Type 2 diabetes mellitus with diabetic chronic kidney disease: Secondary | ICD-10-CM | POA: Insufficient documentation

## 2016-01-28 HISTORY — PX: IR GENERIC HISTORICAL: IMG1180011

## 2016-01-28 LAB — BASIC METABOLIC PANEL
Anion gap: 9 (ref 5–15)
BUN: 33 mg/dL — AB (ref 6–20)
CHLORIDE: 104 mmol/L (ref 101–111)
CO2: 28 mmol/L (ref 22–32)
CREATININE: 5.57 mg/dL — AB (ref 0.44–1.00)
Calcium: 9.3 mg/dL (ref 8.9–10.3)
GFR calc Af Amer: 7 mL/min — ABNORMAL LOW (ref >60)
GFR calc non Af Amer: 6 mL/min — ABNORMAL LOW (ref >60)
GLUCOSE: 84 mg/dL (ref 65–99)
Potassium: 4.3 mmol/L (ref 3.5–5.1)
Sodium: 141 mmol/L (ref 135–145)

## 2016-01-28 LAB — CBC
HEMATOCRIT: 35.5 % — AB (ref 36.0–46.0)
Hemoglobin: 10.5 g/dL — ABNORMAL LOW (ref 12.0–15.0)
MCH: 28.1 pg (ref 26.0–34.0)
MCHC: 29.6 g/dL — ABNORMAL LOW (ref 30.0–36.0)
MCV: 94.9 fL (ref 78.0–100.0)
Platelets: 207 10*3/uL (ref 150–400)
RBC: 3.74 MIL/uL — ABNORMAL LOW (ref 3.87–5.11)
RDW: 17.3 % — ABNORMAL HIGH (ref 11.5–15.5)
WBC: 4.4 10*3/uL (ref 4.0–10.5)

## 2016-01-28 LAB — PROTIME-INR
INR: 1.05
Prothrombin Time: 13.7 seconds (ref 11.4–15.2)

## 2016-01-28 LAB — APTT: aPTT: 35 seconds (ref 24–36)

## 2016-01-28 MED ORDER — MIDAZOLAM HCL 2 MG/2ML IJ SOLN
INTRAMUSCULAR | Status: AC | PRN
  Administered 2016-01-28 (×2): 0.5 mg via INTRAVENOUS

## 2016-01-28 MED ORDER — FENTANYL CITRATE (PF) 100 MCG/2ML IJ SOLN
INTRAMUSCULAR | Status: AC
  Filled 2016-01-28: qty 2

## 2016-01-28 MED ORDER — SODIUM CHLORIDE 0.9 % IV SOLN: INTRAVENOUS | Status: DC

## 2016-01-28 MED ORDER — FENTANYL CITRATE (PF) 100 MCG/2ML IJ SOLN
INTRAMUSCULAR | Status: AC | PRN
  Administered 2016-01-28: 25 ug via INTRAVENOUS
  Administered 2016-01-28: 12.5 ug via INTRAVENOUS
  Administered 2016-01-28: 25 ug via INTRAVENOUS

## 2016-01-28 MED ORDER — MIDAZOLAM HCL 2 MG/2ML IJ SOLN
INTRAMUSCULAR | Status: AC
  Filled 2016-01-28: qty 2

## 2016-01-28 MED ORDER — SODIUM CHLORIDE 0.9 % IV SOLN
INTRAVENOUS | Status: AC | PRN
  Administered 2016-01-28: 10 mL/h via INTRAVENOUS

## 2016-01-28 MED ORDER — ALTEPLASE 2 MG IJ SOLR
INTRAMUSCULAR | Status: AC
  Administered 2016-01-28: 2 mg
  Filled 2016-01-28: qty 2

## 2016-01-28 MED ORDER — LIDOCAINE HCL 1 % IJ SOLN
INTRAMUSCULAR | Status: AC
  Administered 2016-01-28: 20 mL
  Filled 2016-01-28: qty 20

## 2016-01-28 MED ORDER — HEPARIN SODIUM (PORCINE) 1000 UNIT/ML IJ SOLN
INTRAMUSCULAR | Status: AC
  Administered 2016-01-28: 2000 [IU]
  Filled 2016-01-28: qty 1

## 2016-01-28 MED ORDER — IOPAMIDOL (ISOVUE-300) INJECTION 61%
INTRAVENOUS | Status: AC
  Administered 2016-01-28: 30 mL
  Filled 2016-01-28: qty 100

## 2016-01-28 NOTE — Procedures (Signed)
S/p LUE AVG DECLOT WITH PTA  NO COMP STABLE FULL REPORT IN PACS READY FOR USE

## 2016-01-28 NOTE — Progress Notes (Signed)
Lab is unable to obtain labs/ Very difficult stick.  Unable to obtain IV. IVT has been called and will attempt labs also using vein finder. Pam Turpin,PA notified

## 2016-01-28 NOTE — Discharge Instructions (Signed)
Fistulogram, Care After °Refer to this sheet in the next few weeks. These instructions provide you with information on caring for yourself after your procedure. Your health care provider may also give you more specific instructions. Your treatment has been planned according to current medical practices, but problems sometimes occur. Call your health care provider if you have any problems or questions after your procedure. °WHAT TO EXPECT AFTER THE PROCEDURE °After your procedure, it is typical to have the following: °· A small amount of discomfort in the area where the catheters were placed. °· A small amount of bruising around the fistula. °· Sleepiness and fatigue. °HOME CARE INSTRUCTIONS °· Rest at home for the day following your procedure. °· Do not drive or operate heavy machinery while taking pain medicine. °· Take medicines only as directed by your health care provider. °· Do not take baths, swim, or use a hot tub until your health care provider approves. You may shower 24 hours after the procedure or as directed by your health care provider. °· There are many different ways to close and cover an incision, including stitches, skin glue, and adhesive strips. Follow your health care provider's instructions on: °¨ Incision care. °¨ Bandage (dressing) changes and removal. °¨ Incision closure removal. °· Monitor your dialysis fistula carefully. °SEEK MEDICAL CARE IF: °· You have drainage, redness, swelling, or pain at your catheter site. °· You have a fever. °· You have chills. °SEEK IMMEDIATE MEDICAL CARE IF: °· You feel weak. °· You have trouble balancing. °· You have trouble moving your arms or legs. °· You have problems with your speech or vision. °· You can no longer feel a vibration or buzz when you put your fingers over your dialysis fistula. °· The limb that was used for the procedure: °¨ Swells. °¨ Is painful. °¨ Is cold. °¨ Is discolored, such as blue or pale white. °  °This information is not intended  to replace advice given to you by your health care provider. Make sure you discuss any questions you have with your health care provider. °  °Document Released: 10/07/2013 Document Reviewed: 10/07/2013 °Elsevier Interactive Patient Education ©2016 [employer] ° °

## 2016-01-28 NOTE — H&P (Signed)
Chief Complaint: Patient was seen in consultation today for left upper arm dialysis graft thrombolysis at the request of Sanford,Ryan B  Referring Physician(s): Sanford,Ryan B  Supervising Physician: Ruel Favors  Patient Status: Outpatient  History of Present Illness: April Casey is a 82 y.o. female   Last used Left upper arm dialysis graft 1 week ago ( 01/20/2016) Dialysis 8/16 was successful and without incident  Has not been to dialysis since then secondary pain in legs and hips She has seen MD about this and is taking meds  Last intervention was thrombolysis in IR 12/15/2015 Successful thrombolysis Until RN noted no pulse/thrill 8/23  Request now for thrombolysis with possible angioplasty/stent placement and/or possible tunneled dialysis catheter placement if needed  Past Medical History:  Diagnosis Date  . Anemia   . CKD (chronic kidney disease)   . Diabetes (HCC)   . Dyslipidemia   . Family history of adverse reaction to anesthesia    daughter has difficulty waking   . Gout   . HTN (hypertension)   . Orthostatic hypotension 06/12/2014  . PVD (peripheral vascular disease) (HCC)   . TIA (transient ischemic attack)     Past Surgical History:  Procedure Laterality Date  . AV shunt for dialyisis    . BREAST BIOPSY    . KIDNEY TRANSPLANT    . TUBAL LIGATION      Allergies: Amoxicillin and Codeine  Medications: Prior to Admission medications   Medication Sig Start Date End Date Taking? Authorizing Provider  acetaminophen (TYLENOL) 325 MG tablet Take 650 mg by mouth every 6 (six) hours as needed (pain).    Historical Provider, MD  allopurinol (ZYLOPRIM) 100 MG tablet Take 200 mg by mouth daily.     Historical Provider, MD  clotrimazole (MYCELEX) 10 MG troche Take 10 mg by mouth 3 (three) times daily. As needed for thrush. For 10 days, started on 01-25-16    Historical Provider, MD  gabapentin (NEURONTIN) 300 MG capsule Take 300 mg by mouth at bedtime.     Historical Provider, MD  labetalol (NORMODYNE) 200 MG tablet Take 100 mg by mouth daily as needed (BP elevation). Exceeds 140 take 100 mg 04/03/14   Historical Provider, MD  levothyroxine (SYNTHROID, LEVOTHROID) 25 MCG tablet Take 50 mcg by mouth daily before breakfast.     Historical Provider, MD  mirtazapine (REMERON) 15 MG tablet Take 15 mg by mouth at bedtime.    Historical Provider, MD  mycophenolate (MYFORTIC) 180 MG EC tablet Take 180 mg by mouth 2 (two) times daily.    Historical Provider, MD  predniSONE (DELTASONE) 20 MG tablet Take 2 tablets (40 mg total) by mouth daily. 01/26/16   Laurence Spates, MD  rosuvastatin (CRESTOR) 20 MG tablet Take 20 mg by mouth 3 (three) times a week. Monday, Wednesday and Friday    Historical Provider, MD  tacrolimus (PROGRAF) 1 MG capsule Take 2 mg by mouth 2 (two) times daily.     Historical Provider, MD  Vitamin D, Ergocalciferol, (DRISDOL) 50000 units CAPS capsule Take 50,000 Units by mouth every 7 (seven) days. Monday    Historical Provider, MD     Family History  Problem Relation Age of Onset  . Hypertension Father   . Stroke Father   . Hypertension Mother   . Stroke Mother   . Diabetes Brother   . Heart attack Brother   . Heart failure Sister   . Diabetes Sister   . Heart attack Brother   .  Cancer Brother     Social History   Social History  . Marital status: Married    Spouse name: N/A  . Number of children: 12  . Years of education: N/A   Social History Main Topics  . Smoking status: Never Smoker  . Smokeless tobacco: Never Used  . Alcohol use No  . Drug use: No  . Sexual activity: Not Asked   Other Topics Concern  . None   Social History Narrative   Patient is right handed   Patient drinks one cup caffeine daily.        Review of Systems: A 12 point ROS discussed and pertinent positives are indicated in the HPI above.  All other systems are negative.  Review of Systems  Constitutional: Positive for activity  change and fatigue. Negative for appetite change, fever and unexpected weight change.  Respiratory: Negative for shortness of breath.   Cardiovascular: Negative for chest pain.  Musculoskeletal: Positive for gait problem.  Neurological: Positive for weakness.  Psychiatric/Behavioral: Negative for behavioral problems and confusion.    Vital Signs: BP 123/78   Pulse 94   Temp 97.6 F (36.4 C)   Resp 18   SpO2 98%   Physical Exam  Constitutional: She is oriented to person, place, and time.  Cardiovascular: Normal rate.   Irreg rate  Pulmonary/Chest: Effort normal and breath sounds normal. She has no wheezes.  Abdominal: Soft. Bowel sounds are normal. There is no tenderness.  Musculoskeletal: Normal range of motion.  LUA dialysis graft No pulse; no thrill  Neurological: She is alert and oriented to person, place, and time.  Skin: Skin is warm and dry.  Psychiatric: She has a normal mood and affect. Her behavior is normal. Judgment and thought content normal.  Nursing note and vitals reviewed.   Mallampati Score:  MD Evaluation Airway: WNL Heart: WNL Abdomen: WNL Chest/ Lungs: WNL ASA  Classification: 3 Mallampati/Airway Score: Two  Imaging: Dg Wrist Complete Left  Result Date: 01/26/2016 CLINICAL DATA:  Pain thumb and wrist.  No injury EXAM: LEFT WRIST - COMPLETE 3+ VIEW COMPARISON:  04/07/2011 FINDINGS: Mild degenerative change of the base of thumb. Wrist joints are normal. No erosion or fracture. Arterial calcification. 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 Electronically Signed   By: Marlan Palauharles  Clark M.D.   On: 01/26/2016 15:32   Dg Hand Complete Left  Result Date: 01/26/2016 CLINICAL DATA:  Left thumb and wrist pain. History of gout. No injury EXAM: LEFT HAND - COMPLETE 3+ VIEW COMPARISON:  04/07/2011 FINDINGS: Mild degenerative change in the first interphalangeal joint with cartilage loss and spurring. Small periarticular erosion at the base  of the second proximal phalanx unchanged from the prior study. This is not typical for gout. Question rheumatoid arthritis. Negative for fracture.  No other erosions.  Arterial calcification. 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 Not characteristic for gout. Electronically Signed   By: Marlan Palauharles  Clark M.D.   On: 01/26/2016 15:31    Labs:  CBC:  Recent Labs  11/16/15 2242 12/15/15 1133 12/16/15 1145 01/26/16 1312 01/26/16 1335  WBC 4.8 5.1 5.5 5.0  --   HGB 10.2* 9.1* 9.7* 10.6* 12.2  HCT 34.8* 29.2* 32.4* 35.9* 36.0  PLT 249 225 239 160  --     COAGS:  Recent Labs  12/15/15 1133  INR 1.08  APTT 34    BMP:  Recent Labs  11/16/15 2242 12/15/15 1133 12/16/15 1145 01/26/16 1312 01/26/16 1335  NA 143 133* 133* 139 141  K 4.3 3.2* 3.0* 3.6 3.7  CL 105 92* 92* 105 103  CO2 26 27 27 25   --   GLUCOSE 64* 161* 142* 81 71  BUN 37* 18 20 24* 26*  CALCIUM 10.3 9.7 9.7 9.3  --   CREATININE 5.79* 8.37* 9.18* 5.38* 5.30*  GFRNONAA 6* 4* 4* 7*  --   GFRAA 7* 5* 4* 8*  --     LIVER FUNCTION TESTS:  Recent Labs  06/20/15 2021 11/11/15 2105 11/16/15 2242 12/16/15 1145  BILITOT 0.5 0.7 0.5 0.9  AST 19 22 21 29   ALT 14 11* 9* 12*  ALKPHOS 91 57 58 72  PROT 5.9* 6.2* 6.3* 6.3*  ALBUMIN 3.3* 3.3* 3.3* 3.1*    TUMOR MARKERS: No results for input(s): AFPTM, CEA, CA199, CHROMGRNA in the last 8760 hours.  Assessment and Plan:  Left upper arm dialysis graft clotted Scheduled now for thrombolysis with possible angioplasty/stent; and/or dialysis catheter placement Risks and Benefits discussed with the patient including, but not limited to bleeding, infection, vascular injury, pulmonary embolism, need for tunneled HD catheter placement or even death. All of the patient's questions were answered, patient is agreeable to proceed. Consent signed and in chart.   Thank you for this  interesting consult.  I greatly enjoyed meeting Mertha Baarslgie H Daywalt and look forward to participating in their care.  A copy of this report was sent to the requesting provider on this date.  Electronically Signed: Ralene MuskratURPIN,Marv Alfrey A 01/28/2016, 11:23 AM   I spent a total of  30 Minutes   in face to face in clinical consultation, greater than 50% of which was counseling/coordinating care for LUA dialysis graft declot

## 2016-02-15 ENCOUNTER — Emergency Department (HOSPITAL_COMMUNITY): Payer: Medicare Other

## 2016-02-15 ENCOUNTER — Emergency Department (HOSPITAL_COMMUNITY)
Admission: EM | Admit: 2016-02-15 | Discharge: 2016-02-15 | Disposition: A | Discharge status: Home / Self Care | Payer: Medicare Other | Attending: Emergency Medicine | Admitting: Emergency Medicine

## 2016-02-15 ENCOUNTER — Encounter (HOSPITAL_COMMUNITY): Payer: Self-pay | Admitting: Nurse Practitioner

## 2016-02-15 DIAGNOSIS — M542 Cervicalgia: Secondary | ICD-10-CM | POA: Insufficient documentation

## 2016-02-15 DIAGNOSIS — G8929 Other chronic pain: Secondary | ICD-10-CM | POA: Insufficient documentation

## 2016-02-15 DIAGNOSIS — Z79899 Other long term (current) drug therapy: Secondary | ICD-10-CM | POA: Diagnosis not present

## 2016-02-15 DIAGNOSIS — N183 Chronic kidney disease, stage 3 (moderate): Secondary | ICD-10-CM | POA: Insufficient documentation

## 2016-02-15 DIAGNOSIS — Z7952 Long term (current) use of systemic steroids: Secondary | ICD-10-CM | POA: Diagnosis not present

## 2016-02-15 DIAGNOSIS — E1122 Type 2 diabetes mellitus with diabetic chronic kidney disease: Secondary | ICD-10-CM | POA: Insufficient documentation

## 2016-02-15 DIAGNOSIS — I129 Hypertensive chronic kidney disease with stage 1 through stage 4 chronic kidney disease, or unspecified chronic kidney disease: Secondary | ICD-10-CM | POA: Insufficient documentation

## 2016-02-15 DIAGNOSIS — R52 Pain, unspecified: Secondary | ICD-10-CM

## 2016-02-15 MED ORDER — HYDROCODONE-ACETAMINOPHEN 5-325 MG PO TABS: 1.0000 | ORAL_TABLET | Freq: Four times a day (QID) | ORAL | 0 refills | Status: AC | PRN

## 2016-02-15 MED ORDER — ACETAMINOPHEN 325 MG PO TABS
650.0000 mg | ORAL_TABLET | Freq: Once | ORAL | Status: DC
  Filled 2016-02-15: qty 2

## 2016-02-15 NOTE — Discharge Instructions (Signed)
CT scan of your neck was negative for any acute fractures. It did show mild to moderate degenerative changes consistent with arthritis. Take one Norco at night as needed for pain. Do not take additional tylenol with this medication. Follow up with your primary care doctor tomorrow to be reevaluated for your neck pain.   Return to the emergency department with worsening pain, headache, visual changes, chest pain, shortness of breath, numbness, weakness, fever, or any other concerning symptoms.

## 2016-02-15 NOTE — ED Triage Notes (Signed)
Pt reports a fall a week ago and is now c/o neck pain 8/10. Denies numbness or tingling to the extremities, cervical spine not tender to palpation.

## 2016-02-15 NOTE — ED Provider Notes (Signed)
[facility]-EMERGENCY DEPT Provider Note   CSN: [id number] Arrival date & time: 02/15/16  1515     History   Chief Complaint Chief Complaint  Patient presents with  . Neck Pain  . Fall    HPI April Casey is a 82 y.o. female.  HPI   Patient is a 82 year old female with history of CKD, DM, HTN, PVD, TIA who presents the emergency department with worsening neck pain since last night. Patient states she's had chronic constant neck pain, 8/10, for >8 months. Pain got worse last night, 10/10, throbbing, radiates "all over", she is taking gabapentin without relief. Associated constant left forearm numbness. Patient fell one week ago onto her left side. She states she does not feel her increased neck pain is related to her fall. Patient denies headache, visual changes, dizziness, syncope, weakness.  Past Medical History:  Diagnosis Date  . Anemia   . CKD (chronic kidney disease)   . Diabetes (HCC)   . Dyslipidemia   . Family history of adverse reaction to anesthesia    daughter has difficulty waking   . Gout   . HTN (hypertension)   . Orthostatic hypotension 06/12/2014  . PVD (peripheral vascular disease) (HCC)   . TIA (transient ischemic attack)     Patient Active Problem List   Diagnosis Date Noted  . Sepsis (HCC) 01/15/2015  . UTI (lower urinary tract infection) 01/15/2015  . Encephalopathy 01/15/2015  . Malnutrition of moderate degree (HCC) 01/15/2015  . Acute encephalopathy 12/19/2014  . Protein-calorie malnutrition, severe (HCC) 12/19/2014  . Diabetes mellitus without complication (HCC)   . Fever 12/18/2014  . Acute kidney injury (HCC) 10/31/2014  . Hypoglycemia 10/31/2014  . Dizziness and giddiness 06/12/2014  . Subjective visual disturbance 06/12/2014  . Orthostatic hypotension 06/12/2014  . Atherosclerosis of native arteries of the extremities with intermittent claudication 04/19/2013  . Swelling 04/19/2013  . Gout   . Acute renal failure superimposed on stage 3  chronic kidney disease (HCC)   . Diabetes (HCC)   . TIA (transient ischemic attack)   . Dyslipidemia   . Anemia   . HTN (hypertension)     Past Surgical History:  Procedure Laterality Date  . AV shunt for dialyisis    . BREAST BIOPSY    . IR GENERIC HISTORICAL Left 01/28/2016   IR THROMBECTOMY AV FISTULA W/THROMBOLYSIS/PTA INC/SHUNT/IMG LEFT 01/28/2016 Berdine Dance, MD [facility]  . IR GENERIC HISTORICAL  01/28/2016   IR US GUIDE VASC ACCESS LEFT 01/28/2016 Berdine Dance, MD [facility]  . KIDNEY TRANSPLANT    . TUBAL LIGATION      OB History    No data available       Home Medications    Prior to Admission medications   Medication Sig Start Date End Date Taking? Authorizing Provider  acetaminophen (TYLENOL) 325 MG tablet Take 650 mg by mouth every 6 (six) hours as needed (pain).    Historical Provider, MD  allopurinol (ZYLOPRIM) 100 MG tablet Take 200 mg by mouth daily.     Historical Provider, MD  clotrimazole (MYCELEX) 10 MG troche Take 10 mg by mouth 3 (three) times daily. As needed for thrush. For 10 days, started on 01-25-16    Historical Provider, MD  gabapentin (NEURONTIN) 300 MG capsule Take 300 mg by mouth at bedtime.    Historical Provider, MD  HYDROcodone-acetaminophen (NORCO/VICODIN) 5-325 MG tablet Take 1 tablet by mouth every 6 (six) hours as needed. 02/15/16   Jerre Simon, PA  labetalol (NORMODYNE) 200 MG tablet Take 100 mg by mouth daily as needed (BP elevation). Exceeds 140 take 100 mg 04/03/14   Historical Provider, MD  levothyroxine (SYNTHROID, LEVOTHROID) 25 MCG tablet Take 50 mcg by mouth daily before breakfast.     Historical Provider, MD  mirtazapine (REMERON) 15 MG tablet Take 15 mg by mouth at bedtime.    Historical Provider, MD  mycophenolate (MYFORTIC) 180 MG EC tablet Take 180 mg by mouth 2 (two) times daily.    Historical Provider, MD  predniSONE (DELTASONE) 20 MG tablet Take 2 tablets (40 mg total) by mouth daily. 01/26/16   Laurence Spates, MD  rosuvastatin (CRESTOR) 20 MG tablet Take 20 mg by mouth 3 (three) times a week. Monday, Wednesday and Friday    Historical Provider, MD  tacrolimus (PROGRAF) 1 MG capsule Take 2 mg by mouth 2 (two) times daily.     Historical Provider, MD  Vitamin D, Ergocalciferol, (DRISDOL) 50000 units CAPS capsule Take 50,000 Units by mouth every 7 (seven) days. Monday    Historical Provider, MD    Family History Family History  Problem Relation Age of Onset  . Hypertension Father   . Stroke Father   . Hypertension Mother   . Stroke Mother   . Diabetes Brother   . Heart attack Brother   . Heart failure Sister   . Diabetes Sister   . Heart attack Brother   . Cancer Brother     Social History Social History  Substance Use Topics  . Smoking status: Never Smoker  . Smokeless tobacco: Never Used  . Alcohol use No     Allergies   Amoxicillin and Codeine   Review of Systems Review of Systems  Constitutional: Negative for fever.  Respiratory: Negative for chest tightness and shortness of breath.   Cardiovascular: Negative for chest pain.  Gastrointestinal: Negative for nausea and vomiting.  Musculoskeletal: Positive for neck pain.  Skin: Negative for wound.  Neurological: Negative for dizziness, syncope, weakness, numbness and headaches.     Physical Exam Updated Vital Signs BP 135/64 (BP Location: Right Arm)   Pulse 93   Temp 98.4 F (36.9 C) (Oral)   Resp 14   SpO2 94%   Physical Exam  Constitutional: She appears well-developed and well-nourished. No distress.  HENT:  Head: Normocephalic and atraumatic.  Mouth/Throat: Mucous membranes are dry.  Eyes: Conjunctivae are normal.  Neck: Trachea normal. Spinous process tenderness and muscular tenderness present. Carotid bruit is not present. Decreased range of motion present.  Cardiovascular: Normal rate.  An irregular rhythm present.  Murmur heard.  Systolic murmur is present  Pulmonary/Chest: Effort normal and  breath sounds normal. No stridor. No respiratory distress. She has no wheezes. She has no rhonchi. She has no rales.  Musculoskeletal: She exhibits no edema.  BUE without edema, deformity, erythema. Normal strength, sensation intact, 2+ bilateral radial pulses. Neurovascularly intact distally.  Neurological: She is alert. Coordination normal.  Skin: Skin is warm and dry. She is not diaphoretic.  Psychiatric: She has a normal mood and affect. Her behavior is normal.  Nursing note and vitals reviewed.    ED Treatments / Results  Labs (all labs ordered are listed, but only abnormal results are displayed) Labs Reviewed - No data to display  EKG  EKG Interpretation None       Radiology Ct Cervical Spine Wo Contrast  Result Date: 02/15/2016 CLINICAL DATA:  Fall, neck pain EXAM: CT CERVICAL SPINE WITHOUT CONTRAST TECHNIQUE: Multidetector  CT imaging of the cervical spine was performed without intravenous contrast. Multiplanar CT image reconstructions were also generated. COMPARISON:  None. FINDINGS: Alignment: Normal cervical lordosis. Skull base and vertebrae: No acute fracture. No primary bone lesion or focal pathologic process. Soft tissues and spinal canal: No prevertebral fluid or swelling. No visible canal hematoma. Disc levels: Mild to moderate degenerative changes at C3-4 and C5-6. Spinal canal is patent. Upper chest: Visualized lung apices are clear. Other: None. IMPRESSION: No evidence of traumatic injury to the cervical spine. Mild to moderate degenerative changes. Electronically Signed   By: Charline BillsSriyesh  Krishnan M.D.   On: 02/15/2016 17:56    Procedures Procedures (including critical care time)  Medications Ordered in ED Medications  acetaminophen (TYLENOL) tablet 650 mg (0 mg Oral Hold 02/15/16 1709)     Initial Impression / Assessment and Plan / ED Course  I have reviewed the triage vital signs and the nursing notes.  Pertinent labs & imaging results that were available  during my care of the patient were reviewed by me and considered in my medical decision making (see chart for details).  Clinical Course    Patient with chronic neck pain that got worse yesterday. CT neck reviewed by me revealed no acute abnormalities but degenerative changes. This is likely 2/2 arthritis as the pain is chronic in nature. We'll discharge the patient with pain medication and instructions her to follow-up with her primary care doctor tomorrow to get reevaluated. Discussed strict return precautions. Patient and family expressed understanding to the discharge instructions.  Pt case discussed at pt seen by Dr. Freida BusmanAllen who agrees with the above plan.  Final Clinical Impressions(s) / ED Diagnoses   Final diagnoses:  Neck pain    New Prescriptions New Prescriptions   HYDROCODONE-ACETAMINOPHEN (NORCO/VICODIN) 5-325 MG TABLET    Take 1 tablet by mouth every 6 (six) hours as needed.     Jerre SimonJessica L Tyshaun Vinzant, PA 02/15/16 Rickey [id number]

## 2016-04-06 DEATH — deceased

## 2016-05-31 IMAGING — CR DG CHEST 2V
2 series · 2 of 2 positions shown · non-contrast
Comparison: Chest radiograph performed 12/18/2014

CLINICAL DATA: Patient found unresponsive.  Initial encounter.

EXAM:
CHEST  2 VIEW

[chest lat]
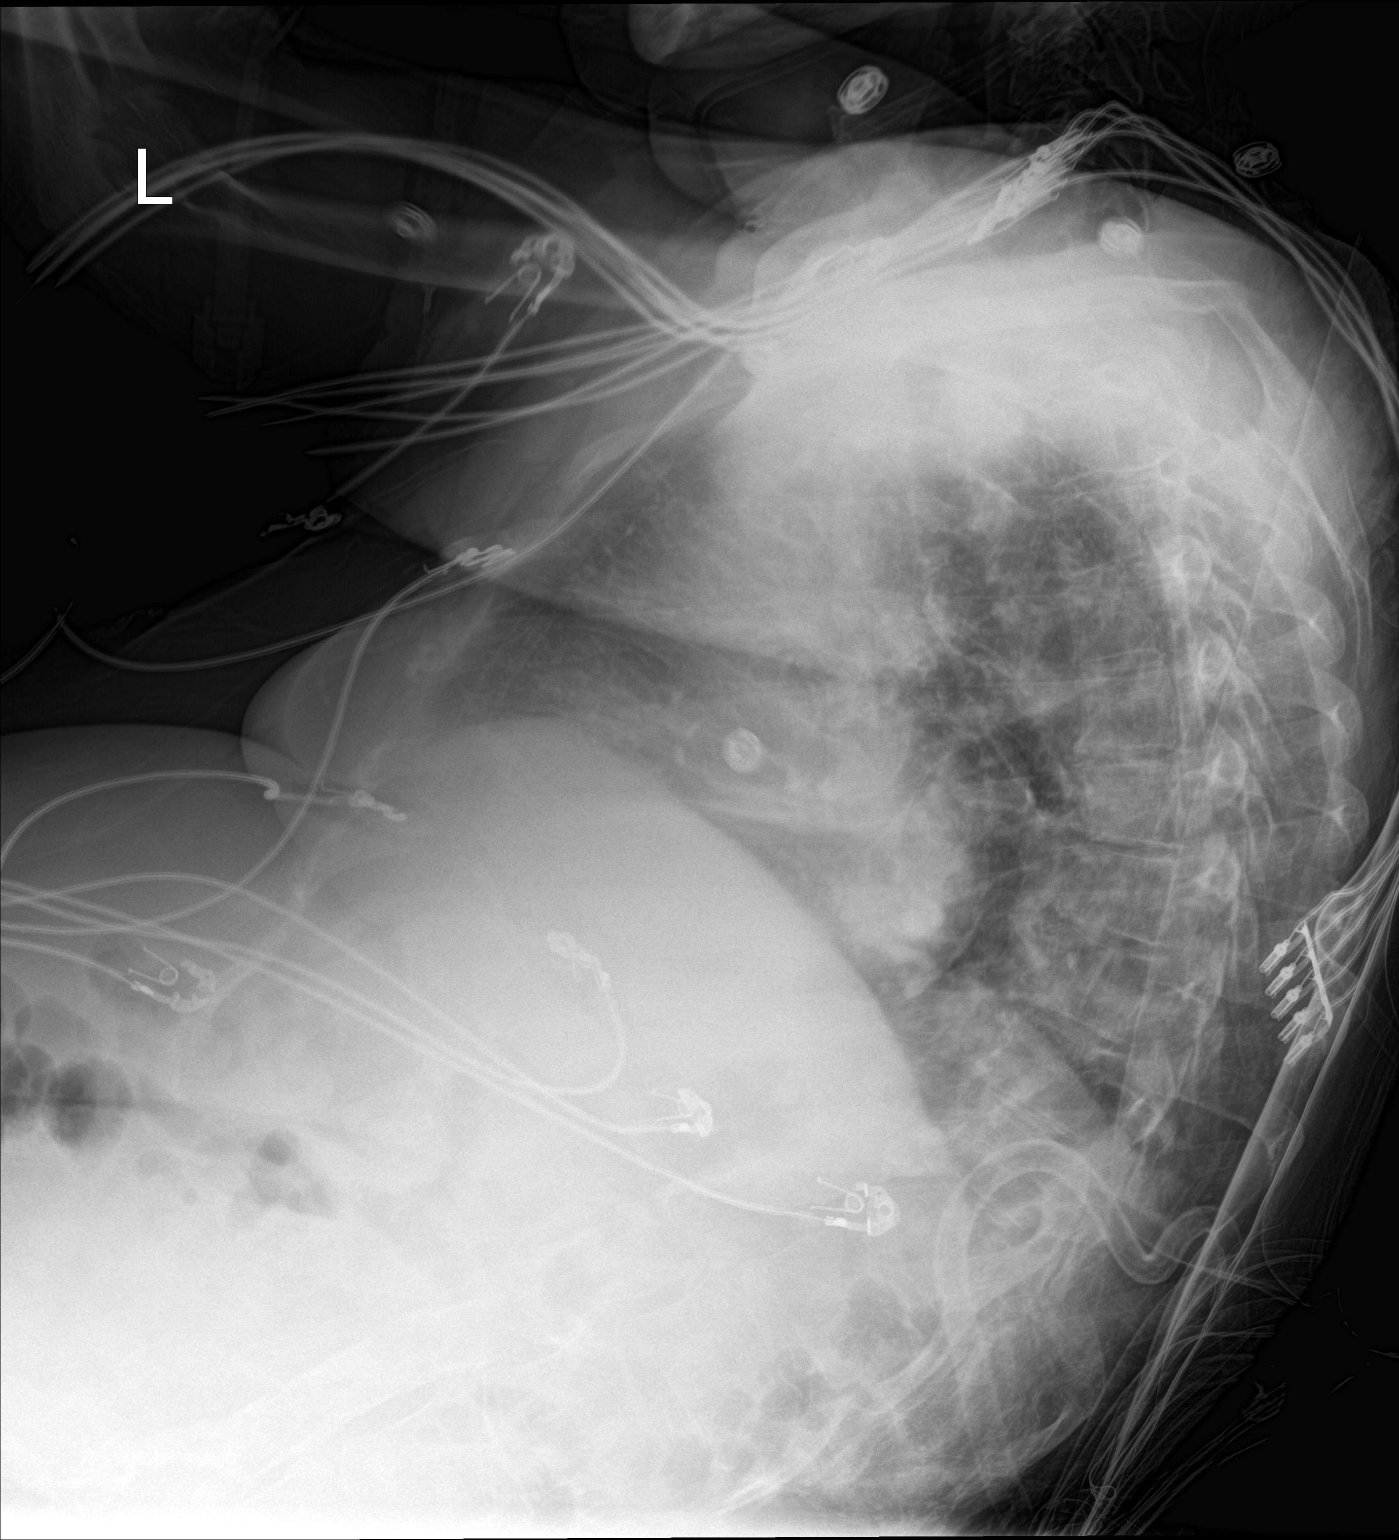

[chest ap]
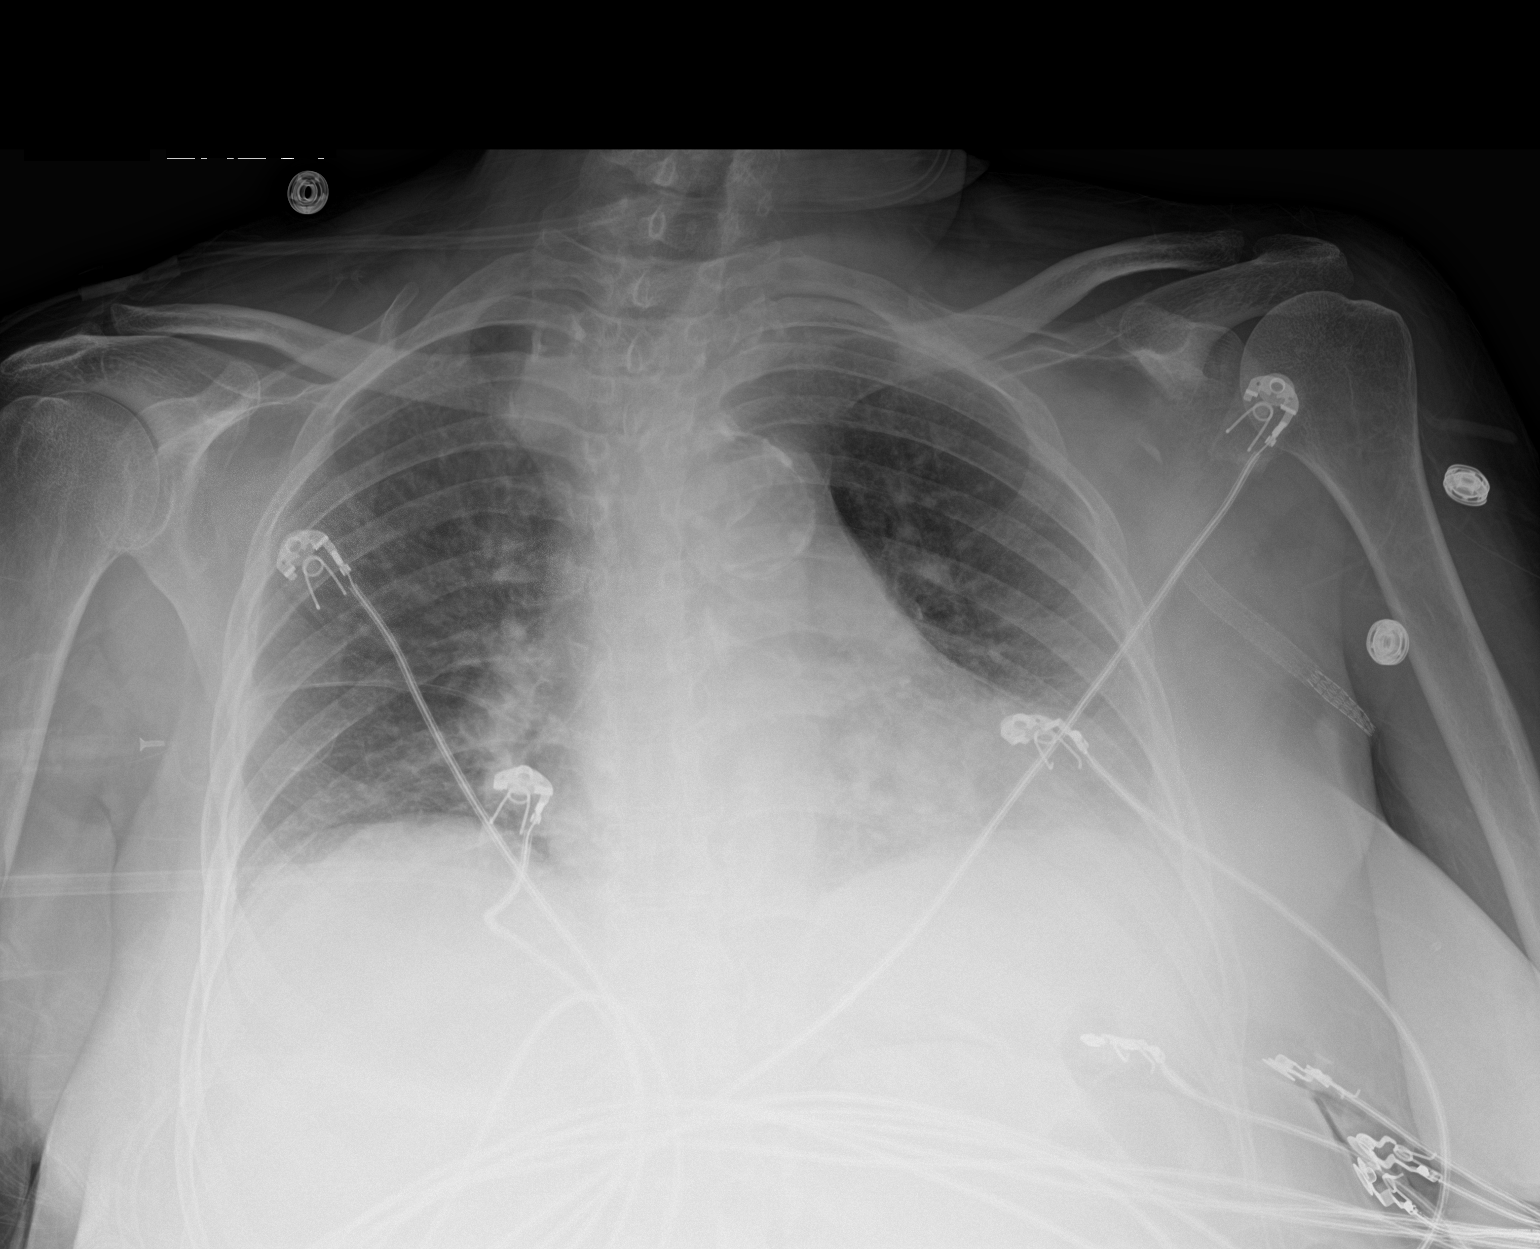

[2 of 2 positions shown; findings below may reference images not displayed]

FINDINGS: The lungs are hypoexpanded. Vascular crowding and vascular
congestion are seen. Left basilar airspace opacity may reflect
atelectasis or pneumonia. No pleural effusion or pneumothorax is
seen.

The heart is borderline normal in size. No acute osseous
abnormalities are seen. Diffuse calcification is seen along the
abdominal aorta.
IMPRESSION: Lungs hypoexpanded. Vascular congestion noted. Left basilar airspace
opacity may reflect atelectasis or pneumonia.

## 2017-06-11 IMAGING — DX DG WRIST COMPLETE 3+V*L*
4 series · 4 of 4 positions shown · non-contrast
Comparison: 04/07/2011

CLINICAL DATA: Pain thumb and wrist.  No injury

EXAM:
LEFT WRIST - COMPLETE 3+ VIEW

[x wrist pa left]
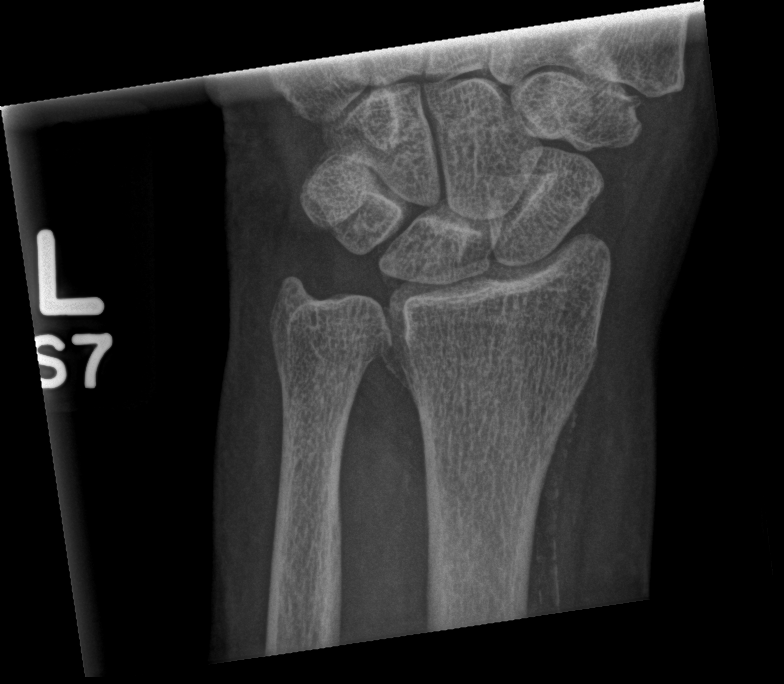

[x wrist obl left]
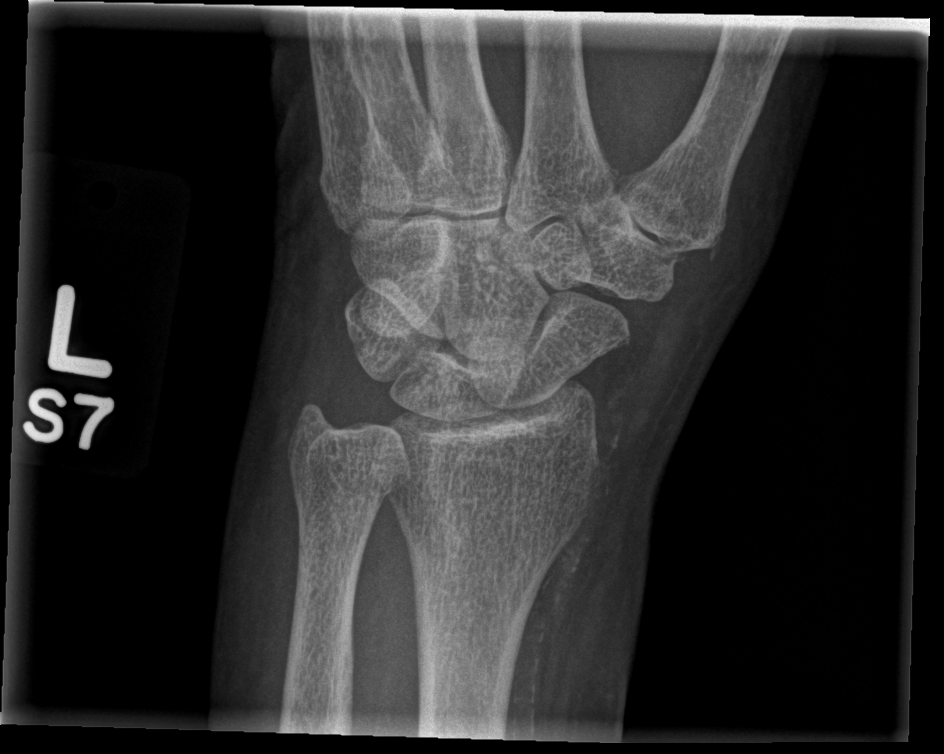

[x wrist lat left]
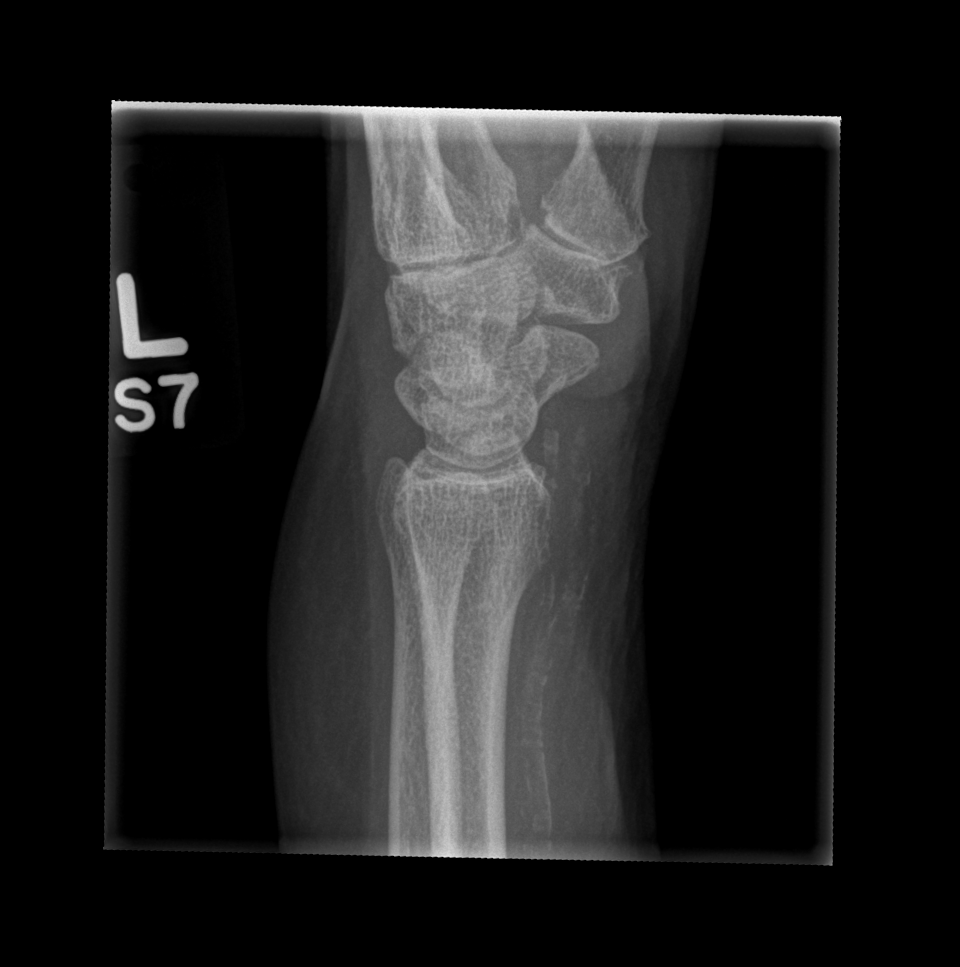

[x wrist navicular view left]
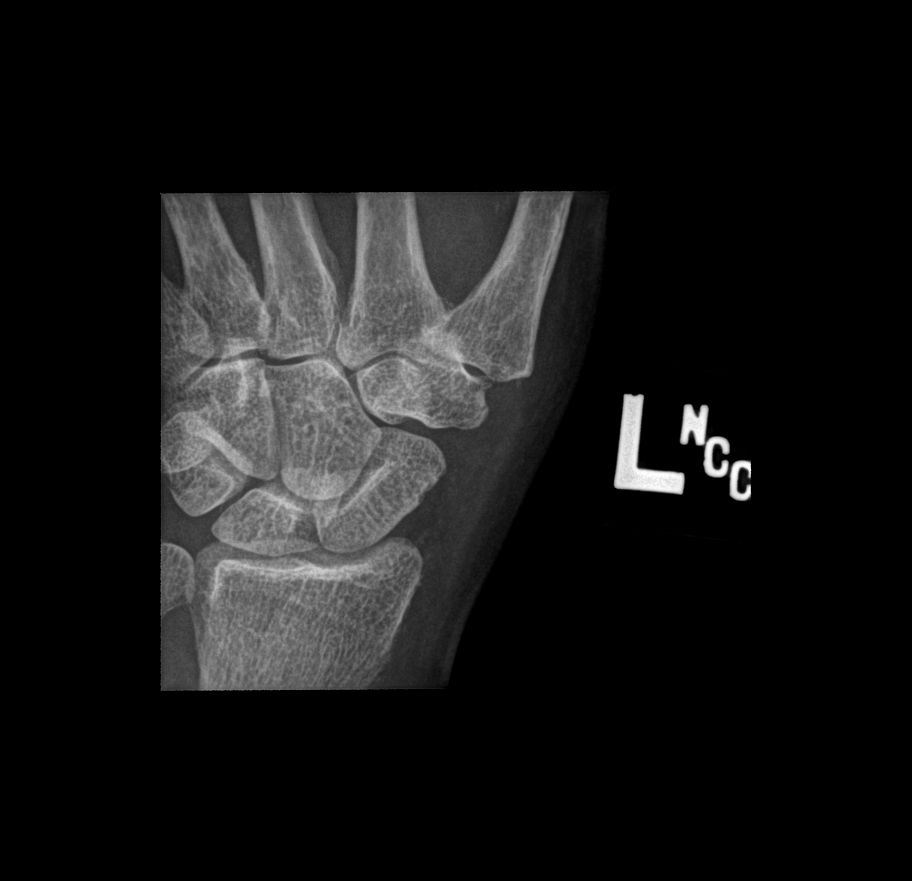

[4 of 4 positions shown; findings below may reference images not displayed]

FINDINGS: Mild degenerative change of the base of thumb. Wrist joints are
normal. No erosion or fracture. Arterial calcification.
IMPRESSION: Mild degenerative arthritis of the base of the thumb.

## 2023-06-05 ENCOUNTER — Encounter (HOSPITAL_COMMUNITY): Payer: Self-pay
